# Patient Record
Sex: Female | Born: 1975 | Race: Black or African American | Hispanic: No | Marital: Married | Smoking: Never smoker
Health system: Southern US, Community
[De-identification: ages and names within clinical notes are randomized; demographics above are authoritative.]

## PROBLEM LIST (undated history)

## (undated) ENCOUNTER — Inpatient Hospital Stay (HOSPITAL_COMMUNITY): Payer: Self-pay

## (undated) DIAGNOSIS — D6859 Other primary thrombophilia: Secondary | ICD-10-CM

## (undated) DIAGNOSIS — Z8719 Personal history of other diseases of the digestive system: Secondary | ICD-10-CM

## (undated) DIAGNOSIS — O021 Missed abortion: Secondary | ICD-10-CM

## (undated) DIAGNOSIS — Z8742 Personal history of other diseases of the female genital tract: Secondary | ICD-10-CM

## (undated) DIAGNOSIS — I10 Essential (primary) hypertension: Secondary | ICD-10-CM

## (undated) DIAGNOSIS — Z8709 Personal history of other diseases of the respiratory system: Secondary | ICD-10-CM

## (undated) DIAGNOSIS — O039 Complete or unspecified spontaneous abortion without complication: Secondary | ICD-10-CM

## (undated) DIAGNOSIS — K5730 Diverticulosis of large intestine without perforation or abscess without bleeding: Secondary | ICD-10-CM

## (undated) HISTORY — DX: Personal history of other diseases of the respiratory system: Z87.09

## (undated) HISTORY — DX: Other primary thrombophilia: D68.59

## (undated) HISTORY — DX: Personal history of other diseases of the digestive system: Z87.19

## (undated) HISTORY — DX: Complete or unspecified spontaneous abortion without complication: O03.9

---

## 1997-08-22 HISTORY — PX: GYNECOLOGIC CRYOSURGERY: SHX857

## 1998-09-21 ENCOUNTER — Other Ambulatory Visit: Admission: RE | Admit: 1998-09-21 | Discharge: 1998-09-21 | Payer: Self-pay | Admitting: Obstetrics and Gynecology

## 2000-02-03 ENCOUNTER — Inpatient Hospital Stay (HOSPITAL_COMMUNITY): Admission: AD | Admit: 2000-02-03 | Discharge: 2000-02-03 | Payer: Self-pay | Admitting: Obstetrics and Gynecology

## 2000-03-21 ENCOUNTER — Ambulatory Visit (HOSPITAL_COMMUNITY): Admission: RE | Admit: 2000-03-21 | Discharge: 2000-03-21 | Payer: Self-pay | Admitting: *Deleted

## 2000-03-21 ENCOUNTER — Encounter: Payer: Self-pay | Admitting: *Deleted

## 2000-04-18 ENCOUNTER — Ambulatory Visit (HOSPITAL_COMMUNITY): Admission: RE | Admit: 2000-04-18 | Discharge: 2000-04-18 | Payer: Self-pay | Admitting: *Deleted

## 2000-06-06 ENCOUNTER — Encounter (INDEPENDENT_AMBULATORY_CARE_PROVIDER_SITE_OTHER): Payer: Self-pay

## 2000-06-06 ENCOUNTER — Inpatient Hospital Stay (HOSPITAL_COMMUNITY): Admission: RE | Admit: 2000-06-06 | Discharge: 2000-06-11 | Payer: Self-pay | Admitting: *Deleted

## 2000-06-07 ENCOUNTER — Encounter: Payer: Self-pay | Admitting: Obstetrics and Gynecology

## 2000-06-13 ENCOUNTER — Inpatient Hospital Stay (HOSPITAL_COMMUNITY): Admission: AD | Admit: 2000-06-13 | Discharge: 2000-06-13 | Payer: Self-pay | Admitting: *Deleted

## 2000-06-14 ENCOUNTER — Inpatient Hospital Stay (HOSPITAL_COMMUNITY): Admission: AD | Admit: 2000-06-14 | Discharge: 2000-06-14 | Payer: Self-pay | Admitting: *Deleted

## 2000-06-16 ENCOUNTER — Inpatient Hospital Stay (HOSPITAL_COMMUNITY): Admission: AD | Admit: 2000-06-16 | Discharge: 2000-06-16 | Payer: Self-pay | Admitting: *Deleted

## 2004-06-17 ENCOUNTER — Ambulatory Visit (HOSPITAL_COMMUNITY): Admission: RE | Admit: 2004-06-17 | Discharge: 2004-06-17 | Payer: Self-pay | Admitting: Gynecology

## 2004-08-18 ENCOUNTER — Ambulatory Visit (HOSPITAL_COMMUNITY): Admission: RE | Admit: 2004-08-18 | Discharge: 2004-08-18 | Payer: Self-pay | Admitting: Gynecology

## 2004-10-07 ENCOUNTER — Ambulatory Visit (HOSPITAL_COMMUNITY): Admission: RE | Admit: 2004-10-07 | Discharge: 2004-10-07 | Payer: Self-pay | Admitting: Gynecology

## 2004-10-28 ENCOUNTER — Encounter (INDEPENDENT_AMBULATORY_CARE_PROVIDER_SITE_OTHER): Payer: Self-pay | Admitting: *Deleted

## 2004-10-28 ENCOUNTER — Inpatient Hospital Stay (HOSPITAL_COMMUNITY): Admission: RE | Admit: 2004-10-28 | Discharge: 2004-10-31 | Payer: Self-pay | Admitting: Gynecology

## 2004-12-27 ENCOUNTER — Ambulatory Visit: Payer: Self-pay | Admitting: Oncology

## 2005-07-05 ENCOUNTER — Ambulatory Visit: Payer: Self-pay | Admitting: Oncology

## 2005-10-27 ENCOUNTER — Ambulatory Visit: Payer: Self-pay | Admitting: Obstetrics & Gynecology

## 2006-03-16 ENCOUNTER — Ambulatory Visit: Payer: Self-pay | Admitting: Obstetrics & Gynecology

## 2006-03-16 ENCOUNTER — Encounter: Payer: Self-pay | Admitting: Obstetrics & Gynecology

## 2007-03-01 ENCOUNTER — Encounter: Payer: Self-pay | Admitting: Obstetrics & Gynecology

## 2007-03-01 ENCOUNTER — Ambulatory Visit: Payer: Self-pay | Admitting: Obstetrics & Gynecology

## 2007-04-05 ENCOUNTER — Ambulatory Visit: Payer: Self-pay | Admitting: Obstetrics & Gynecology

## 2007-06-05 ENCOUNTER — Ambulatory Visit: Payer: Self-pay | Admitting: Gynecology

## 2007-10-30 ENCOUNTER — Ambulatory Visit: Payer: Self-pay | Admitting: Gynecology

## 2007-11-12 ENCOUNTER — Ambulatory Visit: Payer: Self-pay | Admitting: Gynecology

## 2008-03-26 ENCOUNTER — Ambulatory Visit: Payer: Self-pay | Admitting: Gynecology

## 2008-04-08 ENCOUNTER — Inpatient Hospital Stay (HOSPITAL_COMMUNITY): Admission: AD | Admit: 2008-04-08 | Discharge: 2008-04-09 | Payer: Self-pay | Admitting: Obstetrics & Gynecology

## 2008-08-25 ENCOUNTER — Ambulatory Visit: Payer: Self-pay | Admitting: Obstetrics & Gynecology

## 2008-08-25 ENCOUNTER — Encounter (INDEPENDENT_AMBULATORY_CARE_PROVIDER_SITE_OTHER): Payer: Self-pay | Admitting: Gynecology

## 2008-08-25 LAB — CONVERTED CEMR LAB
HCV Ab: NEGATIVE
Hepatitis B Surface Ag: NEGATIVE

## 2008-08-26 ENCOUNTER — Encounter (INDEPENDENT_AMBULATORY_CARE_PROVIDER_SITE_OTHER): Payer: Self-pay | Admitting: Gynecology

## 2008-08-26 LAB — CONVERTED CEMR LAB: WBC, Wet Prep HPF POC: NONE SEEN

## 2009-04-13 ENCOUNTER — Ambulatory Visit: Payer: Self-pay | Admitting: Obstetrics and Gynecology

## 2009-04-13 LAB — CONVERTED CEMR LAB
Chlamydia, DNA Probe: NEGATIVE
HCV Ab: NEGATIVE
Hepatitis B Surface Ag: NEGATIVE

## 2009-04-14 ENCOUNTER — Encounter: Payer: Self-pay | Admitting: Obstetrics and Gynecology

## 2009-04-14 LAB — CONVERTED CEMR LAB

## 2009-04-30 ENCOUNTER — Ambulatory Visit: Payer: Self-pay | Admitting: Obstetrics & Gynecology

## 2009-04-30 ENCOUNTER — Encounter: Payer: Self-pay | Admitting: Obstetrics & Gynecology

## 2009-10-05 ENCOUNTER — Ambulatory Visit: Payer: Self-pay | Admitting: Obstetrics and Gynecology

## 2009-10-06 ENCOUNTER — Encounter: Payer: Self-pay | Admitting: Obstetrics and Gynecology

## 2009-10-06 LAB — CONVERTED CEMR LAB
Chlamydia, DNA Probe: NEGATIVE
Clue Cells Wet Prep HPF POC: NONE SEEN
GC Probe Amp, Genital: NEGATIVE
Trich, Wet Prep: NONE SEEN

## 2009-11-19 ENCOUNTER — Ambulatory Visit: Payer: Self-pay | Admitting: Obstetrics & Gynecology

## 2009-12-31 ENCOUNTER — Ambulatory Visit: Payer: Self-pay | Admitting: Obstetrics and Gynecology

## 2010-01-01 ENCOUNTER — Encounter: Payer: Self-pay | Admitting: Obstetrics and Gynecology

## 2010-01-01 LAB — CONVERTED CEMR LAB: Clue Cells Wet Prep HPF POC: NONE SEEN

## 2010-03-16 ENCOUNTER — Ambulatory Visit: Payer: Self-pay | Admitting: Obstetrics & Gynecology

## 2010-03-17 ENCOUNTER — Encounter (INDEPENDENT_AMBULATORY_CARE_PROVIDER_SITE_OTHER): Payer: Self-pay | Admitting: *Deleted

## 2010-03-17 LAB — CONVERTED CEMR LAB
Clue Cells Wet Prep HPF POC: NONE SEEN
Trich, Wet Prep: NONE SEEN

## 2010-05-04 ENCOUNTER — Ambulatory Visit: Payer: Self-pay | Admitting: Family Medicine

## 2010-05-04 ENCOUNTER — Encounter: Payer: Self-pay | Admitting: Obstetrics & Gynecology

## 2010-05-04 LAB — CONVERTED CEMR LAB
ALT: 14 units/L (ref 0–35)
AST: 18 units/L (ref 0–37)
Albumin: 4.5 g/dL (ref 3.5–5.2)
Alkaline Phosphatase: 52 units/L (ref 39–117)
BUN: 11 mg/dL (ref 6–23)
Calcium: 9 mg/dL (ref 8.4–10.5)
Chloride: 102 meq/L (ref 96–112)
HCV Ab: NEGATIVE
Hepatitis B Surface Ag: NEGATIVE
LDL Cholesterol: 73 mg/dL (ref 0–99)
Potassium: 3.8 meq/L (ref 3.5–5.3)
Sodium: 138 meq/L (ref 135–145)
Total Protein: 7.8 g/dL (ref 6.0–8.3)

## 2010-12-14 ENCOUNTER — Ambulatory Visit (INDEPENDENT_AMBULATORY_CARE_PROVIDER_SITE_OTHER): Payer: 59 | Admitting: Obstetrics and Gynecology

## 2010-12-14 DIAGNOSIS — N898 Other specified noninflammatory disorders of vagina: Secondary | ICD-10-CM

## 2010-12-14 DIAGNOSIS — N899 Noninflammatory disorder of vagina, unspecified: Secondary | ICD-10-CM

## 2011-01-04 NOTE — Assessment & Plan Note (Signed)
[name], [name]                 ACCOUNT NO.:  [id number]   MEDICAL RECORD NO.:  [id number]          PATIENT TYPE:  POB   LOCATION:  [facility] at [facility]         FACILITY:  [facility]   PHYSICIAN:  Scheryl Darter, MD       DATE OF BIRTH:  [birth year]   DATE OF SERVICE:  03/16/2010                                  CLINIC NOTE   CHIEF COMPLAINT:  Vaginal discharge and mild right lower quadrant pain.   The patient is a 35 year old black female gravida 2, para 1-1-0-1.  She  states that she has had several days of slight vaginal burning and  discharge which she thinks may or may not be yeast.  She has had  bacterial vaginosis before.  She had been treated for vaginal yeast  infection several times.  She states that Diflucan use will relieve the  symptoms, but she does note that she has had recurrences.  No odor is  noted.  She has seen some slight brownish tinged to the discharge in the  last few days and she has also had few days a mild right lower quadrant  cramping.  She does this for the last few months right before the period  starts.  The patient has history of protein C deficiency.  Her current  medication is Zyrtec.   ALLERGIES:  No known drug allergies.   On physical exam, the patient is in no acute distress.  Affect is  normal.  Pelvic exam, external genitalia showed some minimal vulvar  irritation and some slight discharge consistent with mild yeast with  some gram staining consistent with light spotting.  Cervix is normal.  No cervical motion tenderness.  Uterus is normal size.  No adnexal  masses or tenderness.  Wet mount was obtained today.   IMPRESSION:  1. Likely recurrent vaginal yeast infection.  2. Premenstrual cramping.   PLAN:  I gave her prescription for Diflucan 150 mg 2 tablets.  She can  take first tablet; and if her symptoms are relieved, she can repeat the  dose after a week if she has recurrent symptoms.  I will prescribe  refills.  She is scheduled for her  yearly exam in September.  She will  notify us if her symptoms do not improve.      Scheryl Darter, MD     JA/MEDQ  D:  03/16/2010  T:  03/16/2010  Job:  [id number]

## 2011-01-04 NOTE — Assessment & Plan Note (Signed)
NAMEBOBIE, CARIS                 ACCOUNT NO.:  [id number]   MEDICAL RECORD NO.:  [id number]          PATIENT TYPE:  POB   LOCATION:  [facility] at [facility]         FACILITY:  [facility]   PHYSICIAN:  Ginger Carne, MD DATE OF BIRTH:  26-Jul-1976   DATE OF SERVICE:  03/26/2008                                  CLINIC NOTE   The patient is here today for routine gynecologic evaluation.  She had  questions about utilizing Mirena IUD, information provided including  literature to the patient.  Apart from that, she has no specific GI, GU,  or cardiac complaints.  Right now, the patient is using condoms for  birth control.   SALIENT PHYSICAL FINDINGS:  VITAL SIGNS:  Blood pressure is 128/83,  height 5 feet 5 inches, pulse 74 and regular.  HEENT:  Grossly normal.  BREASTS:  Exam without masses, discharge, thickenings, or tenderness.  CHEST:  Clear to percussion and auscultation.  CARDIOVASCULAR:  Exam without murmurs or enlargements, regular rate and  rhythm.  EXTREMITY, LYMPHATIC, SKIN, NEUROLOGICAL, AND MUSCULOSKELETAL SYSTEMS:  Within normal limits.  ABDOMEN:  Soft without gross hepatosplenomegaly.  PELVIC EXAM:  Pap smear, GC, and chlamydia ordered.  EXTERNAL GENITALIA: Vulva and vagina normal.  Cervix smooth without  erosions or lesions.  Uterus is small, anteverted, and flexed.  Both  adnexa palpable, found to be normal.   IMPRESSION:  Normal gynecologic exam.   PLAN:  The patient will schedule to have Mirena IUD placed.           ______________________________  Ginger Carne, MD     SHB/MEDQ  D:  03/26/2008  T:  03/27/2008  Job:  [id number]

## 2011-01-04 NOTE — Assessment & Plan Note (Signed)
NAMEKELSEI, DEFINO                 ACCOUNT NO.:  [id number]   MEDICAL RECORD NO.:  [id number]          PATIENT TYPE:  POB   LOCATION:  [facility] at [facility]         FACILITY:  [facility]   PHYSICIAN:  Elsie Lincoln, MD      DATE OF BIRTH:  Dec 25, 1975   DATE OF SERVICE:                                  CLINIC NOTE   The patient is a 35 year old female who was seen last year for her  yearly physical.  She has protein C deficiency.  She is not currently  sexually active.  Her only complaint is of some itching in her left  groin and possibly a yeast infection.  Her left groin looks like it has  some evidence of chronic scratching but is an intertriginous area so  yeast could be a factor.  We discussed keeping the area dry, powder,  hydrocortisone cream, and also 2 doses of Diflucan three days apart.   PAST MEDICAL HISTORY:  No change.   PAST SURGICAL HISTORY:  No change.   PAST GYNECOLOGIC HISTORY:  No change.  Her Pap smear in 2007 did have a  T zone this time.   FAMILY HISTORY:  Remains negative.   REVIEW OF SYMPTOMS:  All negative except as above.  No galactorrhea.   ALLERGIES:  None.   MEDICATIONS:  None.   PHYSICAL EXAMINATION:  VITAL SIGNS:  Pulse 78, blood pressure 130/83,  weight 157.  GENERAL:  Well nourished, well developed, no apparent distress.  HEENT:  Normocephalic atraumatic.  Thyroid with no masses.  Positive  hirsutism remains.  LUNGS:  Clear to auscultation bilaterally.  HEART:  Regular rate and rhythm.  BREASTS:  No masses.  Nontender.  No galactorrhea.  ABDOMEN:  Soft, nontender, nondistended.  No rebound or guarding.  No  organomegaly. No hernia.  PELVIC:  Genitalia Tanner V.  Vagina pink, normal rugae, small amount of  old blood.  The patient is ending her cycle.  Cervix is closed,  nontender.  Uterus anteverted, nontender.  Adnexa, no mass, nontender.  Urethra not tender.  Bladder no prolapse.  Perineum intact.  RECTAL:  No hemorrhoids.  EXTREMITIES:   Nontender.  No edema.   ASSESSMENT:  A 36 year old female for yearly exam.   PLAN:  1. Pap smear.  2. Diflucan.  3. Powder to inguinal area.  4. Keep inguinal area dry.  5. Return to clinic in a year.           ______________________________  Elsie Lincoln, MD     KL/MEDQ  D:  03/01/2007  T:  03/01/2007  Job:  [id number]

## 2011-01-04 NOTE — Assessment & Plan Note (Signed)
[name], [name]                 ACCOUNT NO.:  [id number]   MEDICAL RECORD NO.:  [id number]          PATIENT TYPE:  POB   LOCATION:  [facility] at [facility]         FACILITY:  [facility]   PHYSICIAN:  Catalina Antigua, MD     DATE OF BIRTH:  October 06, 1975   DATE OF SERVICE:  12/31/2009                                  CLINIC NOTE   This is a 35 year old, G2, P1-1-0-1, who presents for evaluation of  recurrent vaginal infection.  The patient states that over the past few  months preceding her periods.  She noted increase in vaginal pruritus  and this occurred like white discharge which she treats with Diflucan  with good results.  The patient presents today secondary to recurrence  of vaginal pruritus, following her menses did not notice any abnormal  discharge, but did take Diflucan on Tuesday.  The patient is otherwise  without any other complaints.   On physical exam, she had normal external genitalia.  No evidence of  yeast infection on the vulva or intercrural folds.  On speculum exam,  she had normal-appearing vaginal mucosa and cervix.  No abnormal  bleeding.  She had a whitish-mucousy discharge.   ASSESSMENT AND PLAN:  This is a 35 year old G2, P1-1-0-1, who is here  for evaluation of vaginal pruritus.  A wet prep was performed.  The  patient already has a prescription for Diflucan that can still be  filled.  The patient was advised to apply Desitin cream twice a day to  see if that would help with her symptoms of vaginal pruritus and  actually prevent them in the future as well.  The patient was advised to  return to clinic if her symptoms did not resolve despite the Diflucan  and perhaps a prescription for boric acid can be provided as this has  been ongoing for several months now.  The patient is otherwise to return  in September for her annual exam.           ______________________________  Catalina Antigua, MD     PC/MEDQ  D:  12/31/2009  T:  01/01/2010  Job:  [id number]

## 2011-01-04 NOTE — Assessment & Plan Note (Signed)
[name], [name]                 ACCOUNT NO.:  [id number]   MEDICAL RECORD NO.:  [id number]          PATIENT TYPE:  POB   LOCATION:  [facility]         FACILITY:  [facility]   PHYSICIAN:  Jaynie Collins, MD     DATE OF BIRTH:  [birth year]   DATE OF SERVICE:  04/30/2009                                  CLINIC NOTE   CHIEF COMPLAINT:  The patient is here for annual examination.   HISTORY OF PRESENT ILLNESS:  The patient is a 35 year old gravida 2,  para 1-1-0-1 who is here for her annual physical examination.  The  patient does report some vulva irritation for a few days and did come in  on April 13, 2009, because she was worried about possible sexually  transmitted infection.  She did have a comprehensive sexually  transmitted infection screen including gonorrhea, Chlamydia, hepatitis  B, C, HIV, RPR and herpes simplex II, which all came back negative.  She  also had a negative wet prep at that evaluation.  The patient was  reassured by those results.  She does say that her vaginal irritation  has been getting better and she is reassured that nothing was found on  evaluation.  She denies any irregular or intermenstrual bleeding,  abnormal vaginal discharge, or any other gynecologic concerns.   PAST OBSTETRICS AND GYNECOLOGIC HISTORY:  The patient does have a  history of a classical cesarean section at 6 months with a neonatal  demise and a repeat cesarean section at term 4 years ago.  After her  neonatal demise, she has extensive workup which revealed a protein C  deficiency and she was on anticoagulation for her second pregnancy.  The  patient has regular menstrual periods.  Her periods last for 5 days with  minimal or no pain.  She denies any intermenstrual bleeding.  She is  sexually active and uses condoms for contraception and STI prevention.  The patient had a history of an abnormal Pap smear at age 35, for which  she underwent cryotherapy and has had normal followup Pap  smears.  Of  note, her last menstrual period was April 05, 2009.  She denies any  sexually transmitted infections.   PAST MEDICAL HISTORY:  Protein C deficiency.   PAST SURGICAL HISTORY:  Cesarean section x2.   MEDICATIONS:  None.   ALLERGIES:  No known drug allergies.  The patient does endorse seasonal  allergies.   SOCIAL HISTORY:  The patient works in the [employer].  She  lives with her daughter at home.  She denies any current physical,  sexual or emotional abuse.  She does not smoke.  She drinks 1 glass of  wine per week.  She denies any illicit drugs.   FAMILY HISTORY:  She has a maternal aunt with breast cancer and a  maternal aunt and maternal great aunt with colon cancer.  Of note, her  maternal aunt with breast cancer was diagnosed in the 26s.  She denies  any other gynecologic cancers.  There is also an extensive family  history of diabetes, hypertension, and heart disease.   REVIEW OF SYSTEMS:  The patient  reports resolving vulvar irritation.  She exercises occasionally.  She takes daily multivitamins and she does  have a primary care physician, Dr. Micah Noel or Dr. Marguerite Olea in [place].   PHYSICAL EXAMINATION:  VITAL SIGNS:  Blood pressure 125/79, pulse 71,  weight 159 pounds, height 5 feet 5 inches.  GENERAL:  No apparent distress.  HEENT:  Normocephalic, atraumatic.  She does have positive hirsutism on  her chin.  NECK:  Supple.  No masses.  LUNGS:  Clear to auscultation bilaterally.  HEART:  Regular rate and rhythm.  BREASTS:  Symmetric in size, soft.  No abnormal masses, skin changes,  lymphadenopathy or nipple discharge noted.  HEART:  Regular rate and rhythm.  ABDOMEN:  Soft, nontender, nondistended.  No organomegaly.  EXTREMITIES:  Nontender.  PELVIC:  Normal external female genitalia.  Pink, well-rugated vagina.  Normal discharge noted.  No lesions noted.  Cervix is closed and is  nontender.  Uterus is small, anteverted, nontender and adnexa  are normal  on palpation and nontender.  RECTAL:  Within normal limits externally.   ASSESSMENT AND PLAN:  The patient is a 35 year old gravida 2, para 1-1-0-  1 here for a physical examination.  The patient had a normal breast  examination.  We will follow up Pap smear results.  The patient has no  other concerns at this point.  She was told to call or come back to the  clinic for any gynecologic issues or in a year for her next annual  examination.           ______________________________  Jaynie Collins, MD     UA/MEDQ  D:  04/30/2009  T:  05/01/2009  Job:  [id number]

## 2011-01-04 NOTE — Assessment & Plan Note (Signed)
NAMECUBA, NATARAJAN                 ACCOUNT NO.:  [id number]   MEDICAL RECORD NO.:  [id number]          PATIENT TYPE:  POB   LOCATION:  [facility]         [facility]ACILITY:  [facility]   PHYSICIAN:  Allie Bossier, MD        DATE OF BIRTH:  1976/06/23   DATE OF SERVICE:  05/04/2010                                  CLINIC NOTE   Ms. [name] is a 35 year old divorced white gravida 2, para 2.  She has 1  living child is a 4-year-old daughter.  She comes in here for annual  exam.  She has no complaints.  She would like her annual FTI testing and  fasting lipids and sugar today.   PAST MEDICAL HISTORY:  Protein C deficiency.   REVIEW OF SYSTEMS:  She has been monogamous for the last year.  She  works at [employer].  She denies dyspareunia.  She uses condoms with  intercourse.   PAST SURGICAL HISTORY:  C-section x2.   FAMILY HISTORY:  Significant for breast cancer in the maternal aunt,  colon cancer within a different maternal aunt and maternal great-aunt.  She denies family history of GYN cancers or diabetes.   SOCIAL HISTORY:  She drinks wine on occasion.  She denies tobacco or  illegal drug use.   No known drug allergies.  No latex allergies.   MEDICATIONS:  Zyrtec on a p.r.n. basis.   PHYSICAL EXAMINATION:  GENERAL:  A well-nourished, well-hydrated very  pleasant white female.  VITAL SIGNS:  Height 5 feet 5 inches, weight 158, blood pressure 127/90,  pulse 71.  HEENT:  Normal.  BREASTS:  Normal bilaterally.  HEART:  Regular rate and rhythm.  LUNGS:  Clear to auscultation bilaterally.  ABDOMEN:  Benign.  EXTERNAL GENITALIA:  No gross lesions.  Cervix nulliparous with normal  discharge.  Uterus normal size and shape, anteverted, not particularly  mobile.  Adnexa nontender.  No masses.   ASSESSMENT AND PLAN:  1. Annual exam.  I have checked her Pap smear.  Recommend self-breast      and self-vulvar exams monthly.  2. I have ordered the FTI screening as she requested.  3. Birth  control.  We discussed Essure tubal ligation as well as      laparoscopic tubal ligation and risks and benefits of each along      with the option of Mirena.  4. Family history of breast and colon cancer.  I have given her      information on BRCA1 and BRCA2 analysis.  5. I have ordered fasting lipids and sugar today.      Allie Bossier, MD     MCD/MEDQ  D:  05/04/2010  T:  05/05/2010  Job:  [id number]

## 2011-01-07 NOTE — Discharge Summary (Signed)
[facility]  Patient:    [name], [name]                        MRN: [id number] Adm. Date:  [id number] Disc. Date: [id number] Attending:  Michaelle Copas Dictator:   Jamey Reas, M.D.                           Discharge Summary  DATE OF BIRTH:  [birth year]  ADMISSION DIAGNOSES:          1. [age]-year-old gravida 1, para 0, at                                  25 weeks by a 14-week ultrasound.                               2. Fetal distress with reverse diastolic flow                                  and severe intrauterine growth retardation on                                  ultrasound.  DISCHARGE DIAGNOSES:          1. [age]-year-old gravida 1, para 0, at                                  25 weeks by a 14-week ultrasound.                               2. Fetal distress with reverse diastolic flow                                  and severe intrauterine growth retardation on                                  ultrasound.                               3. Status post classical cesarean section with                                  neonatal demise at 25-3/7 weeks.  CONSULTATIONS:                None.  PROCEDURES:                   Classical cesarean section on June 08, 2000.  HOSPITAL COURSE:              [age]-year-old gravida 1, para 0-1-0-0 admitted at 25 weeks by 14-week ultrasound at 28 weeks by LMP for severe IUGR of less than third percentile as well as reverse diastolic cord flow. Ultrasound repeated on hospital  day #1, and diastolic flow had improved with normal MCA flow.  The patient continued on close observation.  On the night of hospital day #1, the patients fetus had repetitive decelerations to 60s and to 100s for 60 seconds to two minutes.  Discussion with team members and patient led to conclusion that the babys survival was unsure and, given ultrasound reports and fetal decelerations without  accelerations or reactivity, it was thought best to take the patient to cesarean section.  Due to the patients early gestational age and small uterus, a classical C-section was performed.  A viable female infant delivered with Apgars of 4 at one minute, 5 at five minutes, and 6 at 10 minutes, with a weight of 423 grams. The NICU team intubated the fetus; however, the infant lived less than 24 hours.  The patient remained in stable condition postoperatively.  She had routine care.  She will be discharged to home in good condition.  DISCHARGE MEDICATIONS:        1. Percocet 5/500 mg 1-2 p.o. q.4-6h. p.r.n.                                  severe pain.                               2. Motrin 600 mg 1 p.o. t.i.d. p.r.n. pain.                               3. Prenatal vitamins 1 p.o. q.d. x 6 weeks.  DISCHARGE INSTRUCTIONS:       Per routine for C-section.  FOLLOW-UP:                    The patient will follow up with [facility] in six weeks or return to maternity admissions if she starts feeling depressed or has ______  or any complications from her surgery. DD:  06/11/00 TD:  06/12/00 Job: [id number]

## 2011-01-07 NOTE — Group Therapy Note (Signed)
NAMECEANA, FIALA                 ACCOUNT NO.:  [id number]   MEDICAL RECORD NO.:  [id number]          PATIENT TYPE:  POB   LOCATION[facility]  [facility]                   FACILITY:  [facility]   PHYSICIAN:  Elsie Lincoln, MD      DATE OF BIRTH:  August 03, 1976   DATE OF SERVICE:  03/16/2006                                    CLINIC NOTE   The patient is a 35 year old female; LMP March 02, 2006; para 1-1-0-1.  She  has a history of a 24-week loss secondary to protein C deficiency.  The  patient presents for physical.  Her only recent complaint is vomiting last  night and spotting.  The patient used to have a problem with intermenstrual  spotting, was on birth control pills, but obviously she cannot be on those  anymore secondary to her protein C deficiency.  This is the first month in  many, many, many months that she has ever had intermenstrual spotting.  Will  not do anything about this.  If it continues then we will address.  The  vomiting she thinks is secondary to food poisoning.  If she has any problems  with this she will follow up with her primary care physician.  She is not  sexually active currently.  Her last intercourse was November 08, 2005, so  there is no way she is pregnant.  She did have an STD screen which was  negative for everything earlier in March; however, she would like a complete  STD screen today.  She did use condoms in March.   PAST MEDICAL HISTORY:  Protein C deficiency.   PAST SURGICAL HISTORY:  C-section x1.   PAST GYNECOLOGICAL HISTORY:  C-section x1 and a 24-week loss secondary to  placental abruption per patient report.  Not sexually active currently.  She  has had an abnormal Pap smear at age 48 and she underwent cryo.  Her last  Pap smear did not have a T zone which is probably secondary to the cryo.   FAMILY HISTORY:  Negative for diabetes, ovarian cancer, breast cancer.  She  has two distant aunts with colon cancer.  She has a strong family history of  hypertension.   REVIEW OF SYMPTOMS:  The patient now reports a history of galactorrhea.  She  stopped breast feeding approximately a year ago.   ALLERGIES:  None.   MEDICATIONS:  None.   PHYSICAL EXAMINATION:  GENERAL:  Well-nourished, well-developed, no apparent  distress.  VITAL SIGNS:  Pulse 88, blood pressure 98/70, weight 155.  HEENT:  Normocephalic, atraumatic.  Thyroid:  No masses.  Positive hirsutism  on the face and breasts and midline below the umbilicus.  LUNGS:  Clear to auscultation bilaterally.  HEART:  Regular rate and rhythm.  ABDOMEN:  Soft, nontender, nondistended.  No rebound, no guarding, no  organomegaly, no hernia.  PELVIC:  Genitalia:  Tanner V.  Vagina:  Pink, normal rugae.  Miniscule  amount of blood.  Cervix:  Closed, nontender.  Uterus:  Anteverted,  nontender.  Adnexa:  No masses, nontender.  Urethra:  Nontender.  Bladder:  No prolapse.  Perineum:  Intact.  Rectum:  No hemorrhoids.  EXTREMITIES:  Nontender.  NEUROLOGIC:  Intact.   ASSESSMENT AND PLAN:  A 35 year old female.   1.  Pap smear.  2.  Sexually-transmitted diseases screening.  3.  Prolactin to rule out hyperprolactinemia causing galactorrhea.  4.  Return to clinic in a year or p.r.n.           ______________________________  Elsie Lincoln, MD     KL/MEDQ  D:  03/16/2006  T:  03/16/2006  Job:  [id number]

## 2011-01-07 NOTE — Op Note (Signed)
[facility]  Patient:    [name], [name]                        MRN: [id number] Proc. Date: 06/11/00 Adm. Date:  [id number] Disc. Date: [id number] Attending:  Michaelle Copas                           Operative Report  PREOPERATIVE DIAGNOSES:         1. Intrauterine pregnancy at 25 plus weeks.                                 2. Severe intrauterine growth restriction.                                 3. Abnormal umbilical artery Doppler.                                 4. Suspicious fetal heart rate tracing.  POSTOPERATIVE DIAGNOSES:        1. Intrauterine pregnancy at 25 plus weeks.                                 2. Severe intrauterine growth restriction.                                 3. Abnormal umbilical artery Doppler.                                 4. Suspicious fetal heart rate tracing.  OPERATION:                      Primary classical cesarean section via Pfannenstiel.  SURGEON:                        Roseanna Rainbow, M.D.  ASSISTANTS:                     1. Charles A. Clearance Coots, M.D.                                 2. Jamey Reas, M.D.  ANESTHESIA:                     Spinal.  COMPLICATIONS:                  None.  ESTIMATED BLOOD LOSS:           600 cc.  FLUIDS:                         1500 cc lactated Ringers.  URINE OUTPUT:                   300 cc clear urine at the end of the procedure.  INDICATIONS:  The patient is a 35 year old G1, P0 at 25+ weeks with an ultrasound demonstrating severe intrauterine growth restriction with an estimated fetal weight percentile of less than third percentile. Umbilical artery Dopplers were initially performed and demonstrated reversal of flow with follow-up studies demonstrating minimal diastolic flow with an elevated SD ratio.  The patient had received a course of steroids and was placed on bed rest.  The fetal heart tracing had averaged two decreased  long term variabilities with occasional moderate variable decelerations with over shoot.  FINDINGS:                       Female infant in cephalic presentation. Pediatrics were present at delivery.  Normal uterus, tubes and ovaries.  DESCRIPTION OF PROCEDURE:       The patient was taken to the operating room where spinal anesthetic was administered and found to be adequate.  She was then prepped and draped in the usual sterile fashion in the dorsal supine position with a leftward tilt.  A Pfannenstiel skin incision was then made with the scalpel and carried through to the underlying layer of fascia with the Bovie.  The fascia was incised in the midline and the incision extended laterally with the Mayo scissors.  The superior aspect of the fascial incision was then grasped with Kocher clamps, elevated, and the underlying rectus muscles dissected off.  Attention was then turned to the inferior aspect of this incision which was manipulated in a similar fashion.  The rectus muscles were then separated in the midline.  The parietal peritoneum was identified, tented up and entered sharply.  The peritoneal incision was then extended superiorly and inferiorly with good visualization of the bladder.  The bladder blade was then inserted and the vesicouterine peritoneum identified, grasped with pick ups and entered sharply with Metzenbaum scissors.  The incision was extended laterally and the bladder flap created digitally.  Upon inspection of the lower uterine segment it was felt that a low transverse elliptical incision could not be performed.  Decision at this point was made to proceed with a classical incision.  The bladder blade was inserted.  The uterus was then incised starting at the fundus in the midline and this incision was carried vertically down to the lower uterine segment.  The uterine incision was extended bluntly.  The bladder blade was then removed, and the infant was delivered.   The cord was clamped and cut, and the infant was handed off to the awaiting neonatologist.  Umbilical artery cord gas was sent.  The placenta was then removed.  The uterus was exteriorized and cleared of all clots and debris.  The uterine incision was repaired in layer with 0 Monocryl in a running locked fashion.  The second layer of the same suture was used to obtain excellent hemostasis.  The uterus was returned to the abdomen.  The gutters were cleared of all clots and the peritoneum was closed with 2-0 Vicryl. The fascia was reapproximated with 0 Vicryl in a running fashion.  The skin was closed with staples.  The patient tolerated the procedure well. Sponge, lap and needle counts were correct x 2.  One gram of cefazolin was given at cord clamp.  The patient was taken to the PACU in stable condition. DD:  06/11/00 TD:  06/12/00 Job: [id number]

## 2011-01-07 NOTE — Discharge Summary (Signed)
[name], [name]                 ACCOUNT NO.:  [id number]   MEDICAL RECORD NO.:  [id number]          PATIENT TYPE:  INP   LOCATION:  9133                          FACILITY:  [facility]   PHYSICIAN:  Ginger Carne, MD  DATE OF BIRTH:  1975/11/09   DATE OF ADMISSION:  10/28/2004  DATE OF DISCHARGE:  10/31/2004                                 DISCHARGE SUMMARY   FINAL DIAGNOSIS:  Term viable delivery of female infant. 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   PROCEDURES:  Repeat low transverse cesarean section.   HOSPITAL COURSE:  This patient is a 36 year old African-American female with  a previous classical cesarean section and scheduled for an elective repeat  cesarean section, presenting on October 28, 2004 in active labor. At the time  she was 3 cm, 100% effaced, -2 station with good fetal heart rate and intact  membranes. The patient was taken to the operating room and underwent a low  transverse cesarean section.   The patient's antenatal course was complicated by protein C thrombophilia  and was on Lovenox until the day of her cesarean section. The patient was  group B strep positive. She is B+ blood type and rubella immune. All STD  cultures were negative at 36 weeks.   The patient was afebrile, voided well. Incision was dry and had no other  complications in her postoperative period. The patient's postoperative  hemoglobin was 11.2 from preop of 13.6. She had one temperature elevation in  the first 24 hours of 100.4 degrees Fahrenheit and then down to 100.1  degrees Fahrenheit 4-6 hours later, but ultimately has had a normal  temperature profile.   Her incision was dry, minimal flow. The patient is breast feeding and baby  is doing well. The patient was discharged with ibuprofen and Percocet 5/325.  Routine postoperative and postpartum instructions provided to the patient.  Instructions also included to call the office with temperature elevation  above 100.4  degrees Fahrenheit, symptoms of mastitis, including increasing  breast pain, erythema or tenderness, increased vaginal bleeding, pelvic  pain, incisional drainage or any other concerns. The patient will return in  4 to 5 days to have her subcuticular suture removed and then return in 6  weeks for her routine postoperative postpartum visit. All questions  answered.      SHB/MEDQ  D:  10/31/2004  T:  11/01/2004  Job:  [id number]

## 2011-01-07 NOTE — Op Note (Signed)
[name], Horn                 ACCOUNT NO.:  [id number]   MEDICAL RECORD NO.:  [id number]          PATIENT TYPE:  INP   LOCATION:  9133                          FACILITY:  [facility]   PHYSICIAN:  Ginger Carne, MD  DATE OF BIRTH:  29-May-1976   DATE OF PROCEDURE:  DATE OF DISCHARGE:                                 OPERATIVE REPORT   PREOPERATIVE DIAGNOSES:  1.  Previous cesarean section.  2.  In active labor.  3.  History of protein C thrombophilia.   POSTOPERATIVE DIAGNOSES:  1.  Previous cesarean section.  2.  In active labor.  3.  History of protein C thrombophilia.  4.  Term viable delivery of female infant.   PROCEDURE:  Repeat low transverse cesarean section.   ASSISTANT:  None.   COMPLICATIONS:  None immediate.   ESTIMATED BLOOD LOSS:  500 cc.   ANESTHESIA:  Spinal.   SPECIMENS:  Cord bloods and placenta.   OPERATIVE FINDINGS:  Term female infant delivered in the vertex presentation  on October 28, 2004.  Apgars 9 and 9, weight 6 pounds 12 ounces.  No gross  abnormalities.  The baby cried spontaneously at delivery.  Amniotic fluid  was clear, not foul smelling.  Uterus, tubes, and ovaries showed normal  decidual changes of pregnancy.   OPERATIVE PROCEDURE:  The patient was prepped and draped in the usual  fashion and placed in the left lateral supine position.  Betadine solution  was used for antiseptic and the patient was catheterized prior to the  procedure.  After adequate spinal analgesia, a Pfannenstiel incision was  made and the abdomen opened.  The low uterine segment was incised  transversely after delivering the bladder flap.  The baby delivered, cord  clamped and cut, and infant given to the pediatric staff after bulb  suctioning.  Placenta removed manually.  Uterus inspected and closed the  uterine musculature in one layer of 0 Vicryl running interlocking suture.  Bleeding points hemostatically checked.  Blood clots removed from the  abdomen.  Closure  of the parietal peritoneum with 2-0 Vicryl running  suture, 0 Vicryl running for the fascia, 3-0 Vicryl for the subcutaneous  layer, and 4-0 Prolene for the subcuticular closure.  Instrument and sponge  counts were correct.  The patient tolerated the procedure well and returned  to the postanesthesia recovery room in excellent condition.      SHB/MEDQ  D:  10/28/2004  T:  10/28/2004  Job:  [id number]

## 2011-04-06 ENCOUNTER — Telehealth: Payer: Self-pay

## 2011-04-06 NOTE — Telephone Encounter (Signed)
PATIENT NEEDS DIFLUCAN CALLED IN FOR YEAST INFECTION. SHE USUALLY GETS TO PILLS TO [facility], [place]. THANKS. PATIENT HAS AN APPOINTMENT IN September TO COME IN FOR PHYSICAL.

## 2011-04-07 ENCOUNTER — Other Ambulatory Visit: Payer: Self-pay | Admitting: Gynecology

## 2011-04-07 DIAGNOSIS — B3731 Acute candidiasis of vulva and vagina: Secondary | ICD-10-CM

## 2011-04-07 DIAGNOSIS — B373 Candidiasis of vulva and vagina: Secondary | ICD-10-CM

## 2011-04-07 MED ORDER — FLUCONAZOLE 150 MG PO TABS: 150.0000 mg | ORAL_TABLET | Freq: Once | ORAL | Status: AC

## 2011-05-10 ENCOUNTER — Encounter: Payer: Self-pay | Admitting: Obstetrics & Gynecology

## 2011-05-10 ENCOUNTER — Ambulatory Visit (INDEPENDENT_AMBULATORY_CARE_PROVIDER_SITE_OTHER): Payer: 59 | Admitting: Obstetrics & Gynecology

## 2011-05-10 VITALS — BP 126/81 | HR 83 | Ht 65.0 in | Wt 158.0 lb

## 2011-05-10 DIAGNOSIS — Z113 Encounter for screening for infections with a predominantly sexual mode of transmission: Secondary | ICD-10-CM

## 2011-05-10 DIAGNOSIS — Z0000 Encounter for general adult medical examination without abnormal findings: Secondary | ICD-10-CM

## 2011-05-10 DIAGNOSIS — Z01419 Encounter for gynecological examination (general) (routine) without abnormal findings: Secondary | ICD-10-CM

## 2011-05-10 DIAGNOSIS — Z1272 Encounter for screening for malignant neoplasm of vagina: Secondary | ICD-10-CM

## 2011-05-11 NOTE — Assessment & Plan Note (Signed)
NAMENOELY, KUHNLE                 ACCOUNT NO.:  [id number]  MEDICAL RECORD NO.:  [id number]           PATIENT TYPE:  LOCATION:  [facility]           FACILITY:  PHYSICIAN:  Jaynie Collins, MD          DATE OF BIRTH:  DATE OF SERVICE:  05/10/2011                                 CLINIC NOTE  REASON FOR VISIT:  Annual examination.  Ms. [name] is a 35 year old, gravida 2, para 1-1-0-1 who comes in today for her annual examination.  The patient has no complaints today.  She is asking to be tested for her lipid panel and is also requesting a comprehensive STI screen.  She has no other gynecologic concerns.  PAST OBSTETRICAL HISTORY:  The patient is a gravida 2, para 1-1-0-1. She has a history of a classical cesarean section at 6 months with subsequent neonatal demise, and a repeat cesarean section that was done in 2006. After her neonatal demise, she had extensive workup which revealed a protein C deficiency.  PAST GYNECOLOGIC HISTORY:  Normal Pap smear as per the patient.  No sexually transmitted infections.  The patient does report having recurrent yeast infections.  PAST MEDICAL HISTORY:  Protein C deficiency.  PAST SURGICAL HISTORY:  Cesarean section x2.  MEDICATIONS:  Zyrtec as needed.  ALLERGIES:  No known drug allergies.  FAMILY HISTORY:  The patient has a history of breast cancer in her maternal aunt and colon cancer in her maternal aunt and maternal grandaunt.  SOCIAL HISTORY:  The patient drinks wine occasionally.  She denies any tobacco or illicit drug use.  The patient works at [employer].  REVIEW OF SYSTEMS:  A comprehensive review of systems was reviewed with the patient and was negative.  PHYSICAL EXAMINATION:  VITAL SIGNS:  Blood pressure 126/81, pulse 83, weight 158 pounds, and height 65 inches. GENERAL:  No apparent distress. HEENT:  Normocephalic and atraumatic. NECK:  Supple.  No masses. LUNGS:  Clear to auscultation bilaterally. HEART:  Regular  rate and rhythm. BREASTS:  Symmetric in size, soft, nontender.  No abnormal masses, skin changes, lymphadenopathy, or nipple discharge noted. ABDOMEN:  Soft, nontender, and nondistended.  No organomegaly. EXTREMITIES:  Nontender.  No cyanosis, clubbing, or edema. PELVIC:  Normal external female genitalia.  Pink, well-rugated vagina. Thick white discharge noted.  A sample was taken for wet prep.  No odor was noted.  No lesions noted.  Cervix is closed and is nontender.  Pap smear was obtained. Uterus is small, anteverted, nontender and adnexa are normal on palpation and there are no tenderness to bimanual examination.  ASSESSMENT/PLAN:  The patient is a 35 year old gravida 2, para 1-1-0-1 here for physical examination.  She had a normal breast examination.  We will follow up on the Pap smear results and her other labs that were checked today and manage accordingly.  The patient was told to call or come back in to the clinic for any gynecologic issues or in a year for her annual examination.    ______________________________ Jaynie Collins, MD    UA/MEDQ  D:  05/10/2011  T:  05/10/2011  Job:  [id number]

## 2011-05-11 NOTE — Progress Notes (Signed)
Please refer to dictated office visit note on 05/10/11.

## 2011-05-12 LAB — WET PREP, GENITAL: Yeast Wet Prep HPF POC: NONE SEEN

## 2011-05-12 LAB — CBC
MCH: 30.3 pg (ref 26.0–34.0)
MCHC: 30.7 g/dL (ref 30.0–36.0)
MCV: 98.6 fL (ref 78.0–100.0)
Platelets: 251 10*3/uL (ref 150–400)
RBC: 4.42 MIL/uL (ref 3.87–5.11)
RDW: 13.1 % (ref 11.5–15.5)

## 2011-05-12 LAB — LIPID PANEL
HDL: 61 mg/dL (ref >39)
LDL Cholesterol: 74 mg/dL (ref 0–99)
Total CHOL/HDL Ratio: 2.3 Ratio
Triglycerides: 32 mg/dL (ref <150)
VLDL: 6 mg/dL (ref 0–40)

## 2011-05-16 NOTE — Progress Notes (Signed)
Quick Note:  Needs colposcopy appointment for her LGSIL pap. Please call patient and schedule appointment.  Quynn Vilchis A 05/16/2011 1:17 PM   ______

## 2011-05-18 ENCOUNTER — Telehealth: Payer: Self-pay | Admitting: *Deleted

## 2011-05-18 NOTE — Telephone Encounter (Signed)
Message copied by Barbara Cower on Wed May 18, 2011  3:19 PM ------      Message from: Jaynie Collins A      Created: Mon May 16, 2011  1:18 PM       Needs colposcopy appointment for her LGSIL pap.  Please call patient and schedule appointment.            ANYANWU,UGONNA A      05/16/2011 1:17 PM

## 2011-05-18 NOTE — Telephone Encounter (Signed)
Message was left for patient to call the office for results.

## 2011-05-25 NOTE — Progress Notes (Signed)
Patient is schedule for colpo on 05/31/11.

## 2011-05-31 ENCOUNTER — Ambulatory Visit (INDEPENDENT_AMBULATORY_CARE_PROVIDER_SITE_OTHER): Payer: 59 | Admitting: Obstetrics & Gynecology

## 2011-05-31 VITALS — BP 117/82 | HR 88 | Wt 160.0 lb

## 2011-05-31 DIAGNOSIS — IMO0002 Reserved for concepts with insufficient information to code with codable children: Secondary | ICD-10-CM

## 2011-05-31 DIAGNOSIS — Z87410 Personal history of cervical dysplasia: Secondary | ICD-10-CM | POA: Insufficient documentation

## 2011-05-31 DIAGNOSIS — R87612 Low grade squamous intraepithelial lesion on cytologic smear of cervix (LGSIL): Secondary | ICD-10-CM

## 2011-05-31 LAB — POCT URINE PREGNANCY

## 2011-05-31 NOTE — Progress Notes (Signed)
  Colposcopy Procedure Note  Indications: Pap smear on 05/10/11 showed: low-grade squamous intraepithelial neoplasia (LGSIL - encompassing HPV,mild dysplasia,CIN I). The prior pap showed no abnormalities.  Prior cervical/vaginal disease: normal exam without visible pathology. Prior cervical treatment: cryosurgery and this occured when she was 35 years old.  Patient understands the role of HPV in leading to cervical dysplasia or neoplasia.  .  Procedure Details  The risks and benefits of the procedure and Written informed consent obtained.  Speculum placed in vagina and excellent visualization of cervix achieved, cervix swabbed x 3 with acetic acid solution.  Findings: Cervix: acetowhite lesion(s) noted at 9 and 3 o'clock; SCJ visualized 360 degrees without lesions, endocervical curettage performed, cervical biopsies taken at 9 and 3 o'clock, specimen labelled and sent to pathology and hemostasis achieved with Monsel's solution. Vaginal inspection: vaginal colposcopy not performed. Vulvar colposcopy: vulvar colposcopy not performed.  Specimens: Cervical biopsies and ECC  Complications: none.  Plan: Specimens labelled and sent to Pathology. Will base further treatment on Pathology findings. Treatment options discussed with patient.

## 2011-06-02 NOTE — Progress Notes (Signed)
Quick Note:  Benign colposcopy biopsies and ECC. Needs repeat pap smear in six months.   [name] A 06/02/2011 10:28 AM   ______

## 2011-06-10 ENCOUNTER — Telehealth: Payer: Self-pay | Admitting: *Deleted

## 2011-06-10 NOTE — Telephone Encounter (Signed)
Left message for patient that everything looked ok and she does need to follow up in 6 months.  She is to call us back for details or questions and to make that appointment.

## 2011-06-10 NOTE — Telephone Encounter (Signed)
Message copied by Barbara Cower on Fri Jun 10, 2011  8:07 AM ------      Message from: Jaynie Collins A      Created: Thu Jun 02, 2011 10:28 AM       Benign colposcopy biopsies and ECC. Needs repeat pap smear in six months.             ANYANWU,UGONNA A      06/02/2011 10:28 AM

## 2011-07-12 ENCOUNTER — Telehealth: Payer: Self-pay | Admitting: *Deleted

## 2011-07-12 MED ORDER — FLUCONAZOLE 150 MG PO TABS: 150.0000 mg | ORAL_TABLET | Freq: Once | ORAL | Status: AC

## 2011-07-12 NOTE — Telephone Encounter (Signed)
Patient needs prescription for Diflucan due to yeast infection.

## 2011-07-13 ENCOUNTER — Telehealth: Payer: Self-pay

## 2011-07-13 NOTE — Telephone Encounter (Signed)
PATIENT HAS A YEAST INFECTION, NEEDS AN ANTIBIOTIC. PATIENT LEFT A MESSAGE AND DID NOT SPECIFY PHARMACY ASSUMING ITS THE SAME PHARMACY WE HAVE USED IN THE PAST. THANKS!

## 2011-07-13 NOTE — Telephone Encounter (Signed)
rx was already called into pharmacy

## 2011-08-23 HISTORY — PX: COLPOSCOPY: SHX161

## 2011-12-26 ENCOUNTER — Emergency Department (HOSPITAL_COMMUNITY): Payer: 59

## 2011-12-26 ENCOUNTER — Encounter (HOSPITAL_COMMUNITY): Payer: Self-pay | Admitting: *Deleted

## 2011-12-26 ENCOUNTER — Emergency Department (HOSPITAL_COMMUNITY)
Admission: EM | Admit: 2011-12-26 | Discharge: 2011-12-26 | Discharge status: Against Medical Advice | Payer: 59 | Attending: Emergency Medicine | Admitting: Emergency Medicine

## 2011-12-26 DIAGNOSIS — Z0389 Encounter for observation for other suspected diseases and conditions ruled out: Secondary | ICD-10-CM | POA: Insufficient documentation
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
## 2012-01-05 ENCOUNTER — Encounter: Payer: Self-pay | Admitting: Obstetrics & Gynecology

## 2012-01-05 ENCOUNTER — Ambulatory Visit (INDEPENDENT_AMBULATORY_CARE_PROVIDER_SITE_OTHER): Payer: 59 | Admitting: Obstetrics & Gynecology

## 2012-01-05 ENCOUNTER — Ambulatory Visit: Payer: 59 | Admitting: Obstetrics & Gynecology

## 2012-01-05 VITALS — BP 105/72 | HR 82 | Ht 65.0 in | Wt 162.0 lb

## 2012-01-05 DIAGNOSIS — IMO0002 Reserved for concepts with insufficient information to code with codable children: Secondary | ICD-10-CM

## 2012-01-05 DIAGNOSIS — R87612 Low grade squamous intraepithelial lesion on cytologic smear of cervix (LGSIL): Secondary | ICD-10-CM

## 2012-01-05 DIAGNOSIS — Z113 Encounter for screening for infections with a predominantly sexual mode of transmission: Secondary | ICD-10-CM

## 2012-01-05 DIAGNOSIS — N898 Other specified noninflammatory disorders of vagina: Secondary | ICD-10-CM

## 2012-01-05 DIAGNOSIS — A499 Bacterial infection, unspecified: Secondary | ICD-10-CM

## 2012-01-05 DIAGNOSIS — N760 Acute vaginitis: Secondary | ICD-10-CM

## 2012-01-05 DIAGNOSIS — B9689 Other specified bacterial agents as the cause of diseases classified elsewhere: Secondary | ICD-10-CM

## 2012-01-05 NOTE — Patient Instructions (Signed)
Return to clinic for any scheduled appointments or for any gynecologic concerns as needed.   

## 2012-01-05 NOTE — Progress Notes (Addendum)
History:  36 y.o. [id number] here today for follow up pap smear after LGSIL pap and negative colposcopy biopsies and ECC in 05/2011.  She also reports having pruritic, thick, white discharge for two days.  No abnormal bleeding or any others.  She desires STI screen, reports not having intercourse for several months, denies any exposure but wants testing done today.   The following portions of the patient's history were reviewed and updated as appropriate: allergies, current medications, past family history, past medical history, past social history, past surgical history and problem list.  Review of Systems:  Pertinent items are noted in HPI.  Objective:  Physical Exam Blood pressure 105/72, pulse 82, height 5\' 5"  (1.651 m), weight 162 lb (73.483 kg). Gen: NAD Abd: Soft, nontender and nondistended Pelvic: Normal appearing external genitalia; normal appearing vaginal mucosa and cervix. Pap done, small amount of bleeding noted after pap.  Thick, white discharge seen, wet prep sample obtained.  Small uterus, no other palpable masses, no uterine or adnexal tenderness  Assessment & Plan:  Pap smear sent; reflex GC/Chlam ordered Wet prep ordered Patient to return for blood draw for other STIs For her cervical dysplasia, patient needs pap smears every six months until she has two consecutive normal pap smears, then she can resume annual screening. If any pap smear is abnormal, she will need repeat colposcopy and appropriate evaluation.   01/06/12 Addendum Wet prep showed clue cells, Metronidazole e-prescribed. Patient called and a voicemail message was left to inform her of diagnosis and treatment, and to go pick up the prescription. Counseled about antabuse reaction that can happen with alcohol and metronidazole.  She was also told to call the clinic with any further questions or further concerns.

## 2012-01-06 LAB — WET PREP, GENITAL: Yeast Wet Prep HPF POC: NONE SEEN

## 2012-01-06 MED ORDER — METRONIDAZOLE 500 MG PO TABS: 500.0000 mg | ORAL_TABLET | Freq: Two times a day (BID) | ORAL | Status: AC

## 2012-01-06 NOTE — Progress Notes (Signed)
Addended by: Jaynie Collins A on: 01/06/2012 11:08 AM   Modules accepted: Orders

## 2012-01-10 NOTE — Progress Notes (Signed)
Pap returned as LGSIL.  Patient needs repeat colposcopy and ECC.

## 2012-01-18 NOTE — Progress Notes (Signed)
Left message for patient to call me for test results.

## 2012-01-31 ENCOUNTER — Encounter: Payer: Self-pay | Admitting: Obstetrics & Gynecology

## 2012-01-31 ENCOUNTER — Ambulatory Visit (INDEPENDENT_AMBULATORY_CARE_PROVIDER_SITE_OTHER): Payer: 59 | Admitting: Obstetrics & Gynecology

## 2012-01-31 VITALS — BP 136/90 | HR 82 | Ht 65.0 in | Wt 164.0 lb

## 2012-01-31 DIAGNOSIS — R87612 Low grade squamous intraepithelial lesion on cytologic smear of cervix (LGSIL): Secondary | ICD-10-CM

## 2012-01-31 DIAGNOSIS — IMO0002 Reserved for concepts with insufficient information to code with codable children: Secondary | ICD-10-CM

## 2012-01-31 NOTE — Patient Instructions (Addendum)
Colposcopy Care After Colposcopy is a procedure in which a special tool is used to magnify the surface of the cervix. A tissue sample (biopsy) may also be taken. This sample will be looked at for cervical cancer or other problems. After the test:  You may have some cramping.   Lie down for a few minutes if you feel lightheaded.    You may have some bleeding which should stop in a few days.  HOME CARE  Do not have sex or use tampons for 2 to 3 days or as told.   Only take medicine as told by your doctor.   Continue to take your birth control pills as usual.  Finding out the results of your test Ask when your test results will be ready. Make sure you get your test results. GET HELP RIGHT AWAY IF:  You are bleeding a lot or are passing blood clots.   You develop a fever of 102 F (38.9 C) or higher.   You have abnormal vaginal discharge.   You have cramps that do not go away with medicine.   You feel lightheaded, dizzy, or pass out (faint).  MAKE SURE YOU:   Understand these instructions.   Will watch your condition.   Will get help right away if you are not doing well or get worse.  Document Released: 01/25/2008 Document Revised: 07/28/2011 Document Reviewed: 01/25/2008 Exit[employer] Patient Information 2012 [employer]. 

## 2012-01-31 NOTE — Progress Notes (Signed)
  Colposcopy Procedure Note  Indications: Pap smear on 05/10/11 showed: low-grade squamous intraepithelial neoplasia (LGSIL - encompassing HPV,mild dysplasia,CIN I).  This was followed by a colposcopy in 05/2011 that showed benign biopsies and ECC.  Follow up pap smear on 01/05/12 showed LGSIL.  She is here today for repeat colposcopy, biopsies and ECC. Prior cervical treatment: cryosurgery and this occured when she was 36 years old. Patient understands the role of HPV in leading to cervical dysplasia or neoplasia. .  Procedure Details  The risks and benefits of the procedure and Written informed consent obtained.  Speculum placed in vagina and excellent visualization of cervix achieved, cervix swabbed x 3 with acetic acid solution.  Findings: Cervix: acetowhite lesion(s) noted at 9 and 11 o'clock; cervical biopsies taken at these sites.  ECC done.  Hemostasis achieved with Monsel's solution.  Specimens: Cervical biopsies and ECC  Complications: None.  Plan: Specimens labelled and sent to Pathology. Will base further treatment on Pathology findings. Post biopsy instructions given to patient.

## 2012-02-03 ENCOUNTER — Encounter: Payer: Self-pay | Admitting: Obstetrics & Gynecology

## 2012-02-03 NOTE — Progress Notes (Signed)
Quick Note:  Cervical Dysplasia Overview 04/2011 LGSIL pap followed by benign colposcopis biopsies and ECC 12/2011 LGSIL pap followed by benign colposcopic biopsies and ECC As per updated ASCCP guidelines, she needs cotesting (pap and HPV tests) in one year. Please call to inform patient that she will need to return for appointment in one year If LGSIL cytology persists for more than 2 years , she will need cryotherapy vs LEEP  Jaynie Collins, M.D. 02/03/2012 7:46 AM   ______

## 2012-02-15 ENCOUNTER — Encounter: Payer: 59 | Admitting: Obstetrics and Gynecology

## 2012-02-15 ENCOUNTER — Encounter: Payer: 59 | Admitting: Physician Assistant

## 2012-02-16 NOTE — Progress Notes (Signed)
Spoke to patient . Patient is aware of result. Will follow up in one year.

## 2012-10-01 ENCOUNTER — Telehealth: Payer: Self-pay | Admitting: *Deleted

## 2012-10-01 DIAGNOSIS — B373 Candidiasis of vulva and vagina: Secondary | ICD-10-CM

## 2012-10-01 MED ORDER — FLUCONAZOLE 150 MG PO TABS: 150.0000 mg | ORAL_TABLET | Freq: Once | ORAL | Status: DC

## 2012-10-01 NOTE — Telephone Encounter (Signed)
Patient has itching that started Saturday on the outside, no increase in discharge or odor.  She would like a diflucan called in.

## 2012-11-20 DIAGNOSIS — Z8719 Personal history of other diseases of the digestive system: Secondary | ICD-10-CM

## 2012-11-20 HISTORY — DX: Personal history of other diseases of the digestive system: Z87.19

## 2012-11-23 ENCOUNTER — Encounter (HOSPITAL_COMMUNITY): Payer: Self-pay | Admitting: *Deleted

## 2012-11-23 ENCOUNTER — Inpatient Hospital Stay (HOSPITAL_COMMUNITY)
Admission: AD | Admit: 2012-11-23 | Discharge: 2012-11-23 | Disposition: A | Discharge status: Home / Self Care | Payer: 59 | Source: Ambulatory Visit | Attending: Obstetrics and Gynecology | Admitting: Obstetrics and Gynecology

## 2012-11-23 ENCOUNTER — Inpatient Hospital Stay (HOSPITAL_COMMUNITY): Payer: 59

## 2012-11-23 DIAGNOSIS — N946 Dysmenorrhea, unspecified: Secondary | ICD-10-CM

## 2012-11-23 DIAGNOSIS — N760 Acute vaginitis: Secondary | ICD-10-CM | POA: Insufficient documentation

## 2012-11-23 DIAGNOSIS — B9689 Other specified bacterial agents as the cause of diseases classified elsewhere: Secondary | ICD-10-CM | POA: Insufficient documentation

## 2012-11-23 DIAGNOSIS — A499 Bacterial infection, unspecified: Secondary | ICD-10-CM | POA: Insufficient documentation

## 2012-11-23 DIAGNOSIS — R109 Unspecified abdominal pain: Secondary | ICD-10-CM | POA: Insufficient documentation

## 2012-11-23 DIAGNOSIS — R141 Gas pain: Secondary | ICD-10-CM

## 2012-11-23 LAB — URINALYSIS, ROUTINE W REFLEX MICROSCOPIC
Glucose, UA: NEGATIVE mg/dL
Ketones, ur: 15 mg/dL — AB
Leukocytes, UA: NEGATIVE
Nitrite: NEGATIVE
Protein, ur: NEGATIVE mg/dL
pH: 7 (ref 5.0–8.0)

## 2012-11-23 LAB — WET PREP, GENITAL
Trich, Wet Prep: NONE SEEN
Yeast Wet Prep HPF POC: NONE SEEN

## 2012-11-23 LAB — URINE MICROSCOPIC-ADD ON

## 2012-11-23 MED ORDER — METRONIDAZOLE 500 MG PO TABS: 500.0000 mg | ORAL_TABLET | Freq: Two times a day (BID) | ORAL | Status: DC

## 2012-11-23 NOTE — MAU Note (Signed)
Pain on lower left woke her wed night/thur morning.  Has not changed or gotten worse.   Lots of gas moving around, not used to having that.  Period started on Wed as expected.  Did not check temp, " felt warm and hands were cold.

## 2012-11-23 NOTE — MAU Note (Signed)
Name and DOB verified. Pt confirms spelling is correct.

## 2012-11-23 NOTE — MAU Note (Signed)
Patient states she started having left lower abdominal pain yesterday morning and is not going away. Worse with movement and feels like gas moving. Currently on period.

## 2012-11-23 NOTE — MAU Provider Note (Signed)
History     CSN: [id number]  Arrival date and time: 11/23/12 7829   First Provider Initiated Contact with Patient 11/23/12 1001      Chief Complaint  Patient presents with  . Abdominal Pain   Abdominal Pain Pertinent negatives include no constipation, diarrhea, melena, nausea or vomiting. Fever: subjective fever.   [name] is a 37 y.o.  female who presents with 1 day of 6/10 left lower abdominal pain/pressure/bloating she states the pain woke her up from bed around 430 am yesterday. She states that her menstrual period started Weds, she reports that bloating and gas are normal for her during her period but the pain and subjective fever is what brought her in. Her last bowel movement was last night, she states it was normal and did relieve some of the gas/pressure but it returned shortly after. She tried ibuprofen and tums she states they did not help.    OB History   Grav Para Term Preterm Abortions TAB SAB Ect Mult Living   2 2 1 1      1       Past Medical History  Diagnosis Date  . Protein C deficiency   . Asthma   . Infection     UTI  . Abnormal Pap smear     cryo age 63;colposcopy 2013    Past Surgical History  Procedure Laterality Date  . Cesarean section  06/08/00  . Cesarean section  11/05/04    Family History  Problem Relation Age of Onset  . Breast cancer Maternal Aunt   . Colon cancer Maternal Aunt   . Hypertension Mother   . Hypertension Father   . Diabetes Father   . Stroke Paternal Grandmother   . Cancer Paternal Grandfather     prostate    History  Substance Use Topics  . Smoking status: Never Smoker   . Smokeless tobacco: Never Used  . Alcohol Use: Yes    Allergies: No Known Allergies  Prescriptions prior to admission  Medication Sig Dispense Refill  . cetirizine (ZYRTEC) 10 MG tablet Take 10 mg by mouth daily.        Marland Kitchen ibuprofen (ADVIL,MOTRIN) 400 MG tablet Take 400 mg by mouth every 6 (six) hours as needed. For pain relief      .  Multiple Vitamin (MULITIVITAMIN WITH MINERALS) TABS Take 1 tablet by mouth daily.        Review of Systems  Constitutional: Positive for chills and diaphoresis. Fever: subjective fever.  HENT: Negative.   Respiratory: Negative.   Cardiovascular: Negative.   Gastrointestinal: Positive for abdominal pain. Negative for heartburn, nausea, vomiting, diarrhea, constipation, blood in stool and melena.  Genitourinary: Negative.   Neurological: Negative for dizziness.   Physical Exam   Blood pressure 122/76, pulse 104, temperature 99.6 F (37.6 C), temperature source Oral, resp. rate 16, height 5' 5.5" (1.664 m), weight 71.033 kg (156 lb 9.6 oz), last menstrual period 11/21/2012, SpO2 100.00%.  Physical Exam  Constitutional: She is oriented to person, place, and time. She appears well-developed and well-nourished.  HENT:  Head: Normocephalic and atraumatic.  Eyes: EOM are normal.  Cardiovascular: Normal rate, regular rhythm and intact distal pulses.  Exam reveals no gallop and no friction rub.   No murmur heard. Respiratory: Effort normal and breath sounds normal. No respiratory distress. She has no wheezes. She has no rales. She exhibits no tenderness.  GI: Soft. Bowel sounds are normal. She exhibits no distension and no mass. There is  tenderness (LLQ bloating). There is no rebound and no guarding.  Genitourinary: Vagina normal. Uterus is not enlarged and not tender. Cervix exhibits discharge (small amount of thin white discharge and some blood noted at the cervical os and in the vagina ). Cervix exhibits no motion tenderness and no friability. Right adnexum displays no mass and no tenderness. Left adnexum displays tenderness. Left adnexum displays no mass.  Neurological: She is alert and oriented to person, place, and time. She has normal reflexes.  Skin: Skin is warm and dry.  Psychiatric: She has a normal mood and affect.   Results for orders placed during the hospital encounter of 11/23/12  (from the past 24 hour(s))  URINALYSIS, ROUTINE W REFLEX MICROSCOPIC     Status: Abnormal   Collection Time    11/23/12  9:30 AM      Result Value Range   Color, Urine YELLOW  YELLOW   APPearance CLEAR  CLEAR   Specific Gravity, Urine 1.020  1.005 - 1.030   pH 7.0  5.0 - 8.0   Glucose, UA NEGATIVE  NEGATIVE mg/dL   Hgb urine dipstick LARGE (*) NEGATIVE   Bilirubin Urine NEGATIVE  NEGATIVE   Ketones, ur 15 (*) NEGATIVE mg/dL   Protein, ur NEGATIVE  NEGATIVE mg/dL   Urobilinogen, UA 2.0 (*) 0.0 - 1.0 mg/dL   Nitrite NEGATIVE  NEGATIVE   Leukocytes, UA NEGATIVE  NEGATIVE  URINE MICROSCOPIC-ADD ON     Status: None   Collection Time    11/23/12  9:30 AM      Result Value Range   Squamous Epithelial / LPF RARE  RARE   RBC / HPF 21-50  <3 RBC/hpf  POCT PREGNANCY, URINE     Status: None   Collection Time    11/23/12  9:43 AM      Result Value Range   Preg Test, Ur NEGATIVE  NEGATIVE  WET PREP, GENITAL     Status: Abnormal   Collection Time    11/23/12 10:53 AM      Result Value Range   Yeast Wet Prep HPF POC NONE SEEN  NONE SEEN   Trich, Wet Prep NONE SEEN  NONE SEEN   Clue Cells Wet Prep HPF POC FEW (*) NONE SEEN   WBC, Wet Prep HPF POC FEW (*) NONE SEEN   US Transvaginal Non-ob  11/23/2012  *RADIOLOGY REPORT*  Clinical Data: Left lower quadrant pain for 1 day.  LMP 11/21/2012 with current menses  TRANSABDOMINAL AND TRANSVAGINAL ULTRASOUND OF PELVIS Technique:  Both transabdominal and transvaginal ultrasound examinations of the pelvis were performed. Transabdominal technique was performed for global imaging of the pelvis including uterus, ovaries, adnexal regions, and pelvic cul-de-sac.  It was necessary to proceed with endovaginal exam following the transabdominal exam to visualize the myometrium, endometrium and adnexa.  Comparison:  None  Findings:  Uterus: Is anteverted and anteflexed and has a sagittal length of 8.5 cm, depth of 4.5 cm and width of 5.1 cm.  A homogeneous myometrium  is seen  Endometrium: Is homogeneously echogenic with a width of 5.6 mm.  No areas of focal thickening or heterogeneity are seen  Right ovary:  Measures 3.6 x 1.8 x 3.0 cm and contains a corpus luteum  Left ovary: Measures 3.4 x 1.7 by 1.9 cm and has a normal appearance  Other findings: A small amount of simple free fluid is noted in the cul-de-sac.  No separate adnexal masses are seen.  IMPRESSION: Unremarkable pelvic ultrasound.  Original Report Authenticated By: Rhodia Albright, M.D.    US Pelvis Complete  11/23/2012  *RADIOLOGY REPORT*  Clinical Data: Left lower quadrant pain for 1 day.  LMP 11/21/2012 with current menses  TRANSABDOMINAL AND TRANSVAGINAL ULTRASOUND OF PELVIS Technique:  Both transabdominal and transvaginal ultrasound examinations of the pelvis were performed. Transabdominal technique was performed for global imaging of the pelvis including uterus, ovaries, adnexal regions, and pelvic cul-de-sac.  It was necessary to proceed with endovaginal exam following the transabdominal exam to visualize the myometrium, endometrium and adnexa.  Comparison:  None  Findings:  Uterus: Is anteverted and anteflexed and has a sagittal length of 8.5 cm, depth of 4.5 cm and width of 5.1 cm.  A homogeneous myometrium is seen  Endometrium: Is homogeneously echogenic with a width of 5.6 mm.  No areas of focal thickening or heterogeneity are seen  Right ovary:  Measures 3.6 x 1.8 x 3.0 cm and contains a corpus luteum  Left ovary: Measures 3.4 x 1.7 by 1.9 cm and has a normal appearance  Other findings: A small amount of simple free fluid is noted in the cul-de-sac.  No separate adnexal masses are seen.  IMPRESSION: Unremarkable pelvic ultrasound.   Original Report Authenticated By: Rhodia Albright, M.D.     MAU Course  Procedures None  MDM Speculum exam, bimanual. UA, wet prep, GC/C. Ultrasound.   Assessment and Plan  A: bacterial vaginosis. Pelvis Ultrasound was unremarkable.   Normal bowel gas  P:   Discharge home Rx for flagyl 500mg  BID x 7 days sent to patient's pharmacy Discussed hygiene products and probiotics for avoiding recurrence of BV Patient encouraged to follow-up with [facility] if symptoms continue or worsen Patient may return to MAU as needed or if her condition were to change or worsen  Rosalee Kaufman 11/23/2012, 10:06 AM   I have seen and evaluated this patient with the PA student. I have edited/added to the note above to reflect my observations. I agree with the assessment and plan as written above.   Freddi Starr, PA-C 11/23/2012 5:22 PM

## 2012-11-23 NOTE — MAU Provider Note (Signed)
Attestation of Attending Supervision of Advanced Practitioner (CNM/NP): Evaluation and management procedures were performed by the Advanced Practitioner under my supervision and collaboration.  I have reviewed the Advanced Practitioner's note and chart, and I agree with the management and plan.  Kyrell Ruacho 11/23/2012 7:55 PM

## 2012-11-24 ENCOUNTER — Emergency Department (HOSPITAL_COMMUNITY): Payer: 59

## 2012-11-24 ENCOUNTER — Emergency Department (HOSPITAL_COMMUNITY)
Admission: EM | Admit: 2012-11-24 | Discharge: 2012-11-24 | Disposition: A | Discharge status: Home / Self Care | Payer: 59 | Attending: Emergency Medicine | Admitting: Emergency Medicine

## 2012-11-24 ENCOUNTER — Encounter (HOSPITAL_COMMUNITY): Payer: Self-pay

## 2012-11-24 DIAGNOSIS — K5792 Diverticulitis of intestine, part unspecified, without perforation or abscess without bleeding: Secondary | ICD-10-CM

## 2012-11-24 DIAGNOSIS — Z87440 Personal history of urinary (tract) infections: Secondary | ICD-10-CM | POA: Insufficient documentation

## 2012-11-24 DIAGNOSIS — Z8742 Personal history of other diseases of the female genital tract: Secondary | ICD-10-CM | POA: Insufficient documentation

## 2012-11-24 DIAGNOSIS — D6859 Other primary thrombophilia: Secondary | ICD-10-CM | POA: Insufficient documentation

## 2012-11-24 DIAGNOSIS — Z79899 Other long term (current) drug therapy: Secondary | ICD-10-CM | POA: Insufficient documentation

## 2012-11-24 DIAGNOSIS — J45909 Unspecified asthma, uncomplicated: Secondary | ICD-10-CM | POA: Insufficient documentation

## 2012-11-24 DIAGNOSIS — K5732 Diverticulitis of large intestine without perforation or abscess without bleeding: Secondary | ICD-10-CM | POA: Insufficient documentation

## 2012-11-24 LAB — COMPREHENSIVE METABOLIC PANEL
ALT: 14 U/L (ref 0–35)
Alkaline Phosphatase: 61 U/L (ref 39–117)
BUN: 9 mg/dL (ref 6–23)
CO2: 23 mEq/L (ref 19–32)
Chloride: 102 mEq/L (ref 96–112)
GFR calc Af Amer: >90 mL/min (ref >90)
GFR calc non Af Amer: >90 mL/min (ref >90)
Glucose, Bld: 119 mg/dL — ABNORMAL HIGH (ref 70–99)
Potassium: 3.1 mEq/L — ABNORMAL LOW (ref 3.5–5.1)
Sodium: 134 mEq/L — ABNORMAL LOW (ref 135–145)
Total Bilirubin: 1.6 mg/dL — ABNORMAL HIGH (ref 0.3–1.2)
Total Protein: 7.2 g/dL (ref 6.0–8.3)

## 2012-11-24 LAB — GC/CHLAMYDIA PROBE AMP
CT Probe RNA: NEGATIVE
GC Probe RNA: NEGATIVE

## 2012-11-24 LAB — CBC WITH DIFFERENTIAL/PLATELET
Hemoglobin: 11.9 g/dL — ABNORMAL LOW (ref 12.0–15.0)
Lymphocytes Relative: 5 % — ABNORMAL LOW (ref 12–46)
Lymphs Abs: 0.8 10*3/uL (ref 0.7–4.0)
Monocytes Relative: 10 % (ref 3–12)
Neutro Abs: 13.2 10*3/uL — ABNORMAL HIGH (ref 1.7–7.7)
Neutrophils Relative %: 85 % — ABNORMAL HIGH (ref 43–77)
Platelets: 224 10*3/uL (ref 150–400)
RBC: 3.84 MIL/uL — ABNORMAL LOW (ref 3.87–5.11)
WBC: 15.6 10*3/uL — ABNORMAL HIGH (ref 4.0–10.5)

## 2012-11-24 LAB — CG4 I-STAT (LACTIC ACID): Lactic Acid, Venous: 1.03 mmol/L (ref 0.5–2.2)

## 2012-11-24 MED ORDER — CIPROFLOXACIN HCL 500 MG PO TABS: 500.0000 mg | ORAL_TABLET | Freq: Two times a day (BID) | ORAL | Status: DC

## 2012-11-24 MED ORDER — IOHEXOL 300 MG/ML  SOLN
100.0000 mL | Freq: Once | INTRAMUSCULAR | Status: AC | PRN
  Administered 2012-11-24: 100 mL via INTRAVENOUS

## 2012-11-24 MED ORDER — OXYCODONE-ACETAMINOPHEN 5-325 MG PO TABS: 2.0000 | ORAL_TABLET | ORAL | Status: DC | PRN

## 2012-11-24 MED ORDER — SODIUM CHLORIDE 0.9 % IV BOLUS (SEPSIS)
2000.0000 mL | Freq: Once | INTRAVENOUS | Status: AC
  Administered 2012-11-24: 2000 mL via INTRAVENOUS

## 2012-11-24 MED ORDER — METRONIDAZOLE 500 MG PO TABS: 500.0000 mg | ORAL_TABLET | Freq: Two times a day (BID) | ORAL | Status: DC

## 2012-11-24 MED ORDER — SODIUM CHLORIDE 0.9 % IV SOLN: INTRAVENOUS | Status: DC

## 2012-11-24 MED ORDER — IOHEXOL 300 MG/ML  SOLN: 50.0000 mL | Freq: Once | INTRAMUSCULAR | Status: DC | PRN

## 2012-11-24 MED ORDER — HYDROMORPHONE HCL PF 1 MG/ML IJ SOLN
1.0000 mg | Freq: Once | INTRAMUSCULAR | Status: AC
  Administered 2012-11-24: 1 mg via INTRAVENOUS
  Filled 2012-11-24: qty 1

## 2012-11-24 NOTE — ED Notes (Signed)
Pt states she began having foul/painful gas on Thursday along with LLQ pain.  Pt was seen at [facility] yesterday and had US Pelvis completed.  Pt d/c'd home as nothing was identified as the problem.  Pt states last BM was yesterday and normal for her.

## 2012-11-24 NOTE — ED Provider Notes (Signed)
History     CSN: [id number]  Arrival date & time 11/24/12  1141   First MD Initiated Contact with Patient 11/24/12 1220      Chief Complaint  Patient presents with  . Abdominal Pain  . GI Problem    (Consider location/radiation/quality/duration/timing/severity/associated sxs/prior treatment) HPI This 37 year old female is 3 days of constant unchanging non-colicky moderately severe left lower quadrant abdominal pain worse with position changes and palpation without radiation or associated symptoms, she is no fever chest pain cough shortness breath upper abdominal pain and right-sided abdominal pain nausea vomiting diarrhea or bloody stools dysuria vaginal bleeding or vaginal discharge. She had unremarkable pelvic ultrasound yesterday at [facility] hospital as well as negative pregnancy test and no evidence of urine infection. She was started on Flagyl for possible bacterial vaginosis. Past Medical History  Diagnosis Date  . Protein C deficiency   . Asthma   . Infection     UTI  . Abnormal Pap smear     cryo age 11;colposcopy 2013    Past Surgical History  Procedure Laterality Date  . Cesarean section  06/08/00  . Cesarean section  11/05/04    Family History  Problem Relation Age of Onset  . Breast cancer Maternal Aunt   . Colon cancer Maternal Aunt   . Hypertension Mother   . Hypertension Father   . Diabetes Father   . Stroke Paternal Grandmother   . Cancer Paternal Grandfather     prostate    History  Substance Use Topics  . Smoking status: Never Smoker   . Smokeless tobacco: Never Used  . Alcohol Use: Yes    OB History   Grav Para Term Preterm Abortions TAB SAB Ect Mult Living   2 2 1 1      1       Review of Systems 10 Systems reviewed and are negative for acute change except as noted in the HPI. Allergies  Review of patient's allergies indicates no known allergies.  Home Medications   Current Outpatient Rx  Name  Route  Sig  Dispense  Refill  .  cetirizine (ZYRTEC) 10 MG tablet   Oral   Take 10 mg by mouth daily.           Marland Kitchen ibuprofen (ADVIL,MOTRIN) 400 MG tablet   Oral   Take 400 mg by mouth every 6 (six) hours as needed for pain.          . metroNIDAZOLE (FLAGYL) 500 MG tablet   Oral   Take 500 mg by mouth 2 (two) times daily. For 7 days; Start date 11/23/12         . Multiple Vitamin (MULITIVITAMIN WITH MINERALS) TABS   Oral   Take 1 tablet by mouth daily.         . Simethicone (GAS-X PO)   Oral   Take 1 tablet by mouth 2 (two) times daily as needed (flatulence).         . ciprofloxacin (CIPRO) 500 MG tablet   Oral   Take 1 tablet (500 mg total) by mouth 2 (two) times daily. One po bid x 7 days   14 tablet   0   . metroNIDAZOLE (FLAGYL) 500 MG tablet   Oral   Take 1 tablet (500 mg total) by mouth 2 (two) times daily. One po bid x 7 days   14 tablet   0   . oxyCODONE-acetaminophen (PERCOCET) 5-325 MG per tablet   Oral   Take 2  tablets by mouth every 4 (four) hours as needed for pain.   20 tablet   0     BP 114/70  Pulse 115  Temp(Src) 101 F (38.3 C) (Oral)  Resp 17  SpO2 99%  LMP 11/21/2012  Physical Exam  Nursing note and vitals reviewed. Constitutional:  Awake, alert, nontoxic appearance.  HENT:  Head: Atraumatic.  Eyes: Right eye exhibits no discharge. Left eye exhibits no discharge.  Neck: Neck supple.  Cardiovascular: Normal rate and regular rhythm.   No murmur heard. Pulmonary/Chest: Effort normal and breath sounds normal. No respiratory distress. She has no wheezes. She has no rales. She exhibits no tenderness.  Abdominal: Soft. Bowel sounds are normal. She exhibits no distension and no mass. There is tenderness. There is guarding. There is no rebound.  Localized tenderness to the left lower quadrant with moderate tenderness with localized guarding with no rebound and the rest of the abdomen is nontender there is no CVA tenderness the patient already had a pelvic examination  yesterday at [facility] hospital that was unremarkable  Musculoskeletal: She exhibits no edema and no tenderness.  Baseline ROM, no obvious new focal weakness.  Neurological: She is alert.  Mental status and motor strength appears baseline for patient and situation.  Skin: No rash noted.  Psychiatric: She has a normal mood and affect.    ED Course  Procedures (including critical care time) Pt stable in ED with no significant deterioration in condition. Patient / Family / Caregiver informed of clinical course, understand medical decision-making process, and agree with plan. Labs Reviewed  CBC WITH DIFFERENTIAL - Abnormal; Notable for the following:    WBC 15.6 (*)    RBC 3.84 (*)    Hemoglobin 11.9 (*)    HCT 32.9 (*)    MCHC 36.2 (*)    Neutrophils Relative 85 (*)    Neutro Abs 13.2 (*)    Lymphocytes Relative 5 (*)    Monocytes Absolute 1.6 (*)    All other components within normal limits  COMPREHENSIVE METABOLIC PANEL - Abnormal; Notable for the following:    Sodium 134 (*)    Potassium 3.1 (*)    Glucose, Bld 119 (*)    Albumin 3.2 (*)    Total Bilirubin 1.6 (*)    All other components within normal limits  CG4 I-STAT (LACTIC ACID)   Ct Abdomen Pelvis W Contrast  11/24/2012  *RADIOLOGY REPORT*  Clinical Data: Left lower quadrant abdominal pain.  CT ABDOMEN AND PELVIS WITH CONTRAST  Technique:  Multidetector CT imaging of the abdomen and pelvis was performed following the standard protocol during bolus administration of intravenous contrast.  Contrast: OMNIPAQUE IOHEXOL 300 MG/ML  SOLN  Comparison: None.  Findings: Segmental thickening and inflammation of the colon is noted at the juncture of the descending colon and proximal sigmoid colon.  At this level, colonic wall thickening approaches 1.5 cm. There is a small amount of adjacent extraluminal air without focal abscess.  Findings are most likely consistent with acute diverticulitis.  Given the significant wall thickening  present, follow-up is recommended to exclude tumor.  No associated bowel obstruction is identified.  Other bowel loops are unremarkable.  The liver, gallbladder, spleen, pancreas, adrenal glands and kidneys are within normal limits a small amount of free fluid is noted in the cul-de-sac of the pelvis.  No hernias are identified.  No enlarged lymph nodes. No bony abnormalities.  IMPRESSION: Segmental thickening and inflammation of the colon at the juncture of the  descending colon and proximal sigmoid colon.  Findings most likely reflect acute diverticulitis with a small amount of extraluminal air present and no liquefied abscess.  Given the prominent wall thickening present of the colon, recommend follow-up to exclude underlying tumor.   Original Report Authenticated By: Irish Lack, M.D.      1. Diverticulitis       MDM  I doubt any other EMC precluding discharge at this time including, but not necessarily limited to the following:sepsis, peritonitis.        Hurman Horn, MD 11/26/12 [phone]

## 2012-11-25 ENCOUNTER — Telehealth (HOSPITAL_COMMUNITY): Payer: Self-pay | Admitting: Emergency Medicine

## 2012-11-25 NOTE — ED Notes (Signed)
Call from pt/mother regarding new onset diarrhea and lymph node in neck which is swollen.  Advised to follow diet prescribed and if pt is having pain /new symptom to f/u w/PCP or return as needed.

## 2012-11-29 ENCOUNTER — Encounter: Payer: Self-pay | Admitting: Family Medicine

## 2012-11-29 ENCOUNTER — Ambulatory Visit (INDEPENDENT_AMBULATORY_CARE_PROVIDER_SITE_OTHER): Payer: 59 | Admitting: Family Medicine

## 2012-11-29 ENCOUNTER — Encounter: Payer: Self-pay | Admitting: Gastroenterology

## 2012-11-29 VITALS — BP 124/72 | HR 76 | Temp 98.2°F | Ht 65.0 in | Wt 152.5 lb

## 2012-11-29 DIAGNOSIS — IMO0002 Reserved for concepts with insufficient information to code with codable children: Secondary | ICD-10-CM

## 2012-11-29 DIAGNOSIS — K5732 Diverticulitis of large intestine without perforation or abscess without bleeding: Secondary | ICD-10-CM

## 2012-11-29 DIAGNOSIS — R6889 Other general symptoms and signs: Secondary | ICD-10-CM

## 2012-11-29 DIAGNOSIS — D6859 Other primary thrombophilia: Secondary | ICD-10-CM

## 2012-11-29 DIAGNOSIS — Z800 Family history of malignant neoplasm of digestive organs: Secondary | ICD-10-CM

## 2012-11-29 NOTE — Progress Notes (Signed)
Subjective:    Patient ID: REALITY DEJONGE, female    DOB: 04-18-1976, 37 y.o.   MRN: [id number]  HPI CC: new pt to establish  Recent diverticulitis dx at [facility]. Woke up Thursday morning  11/22/2012 with lower left abdominal pain, also with fever.  No BM changes.  Evaluated at [facility] hospital 11/23/2012 - was concerned with ovarian cyst but this was negative, dx with BV and treated with flagyl.  Didn't get better, continued fever so evaluated at [facility] with CT scan showing diverticulitis.  Started on cipro.  Treated with clear liquid diet.  Continued lower left quadrant pain but improving.  Gassiness has improved.  Never BM changes with this (possible mild constipation).  Some nausea with vomiting. All records reviewed. Unremarkable pelvic US. Wet prep positive for clue cells.  Upreg neg CT abd /pelvis IMPRESSION: Segmental thickening and inflammation of the colon at the juncture of the descending colon and proximal sigmoid colon. Findings most likely reflect acute diverticulitis with a small amount of extraluminal air present and no liquefied abscess. Given the prominent wall thickening present of the colon, recommend follow-up to exclude underlying tumor. Original Report Authenticated By: Irish Lack, M.D.   Worked 1/2 day on Monday, unable to stay full day 2/2 pain.  Went back to work Wednesday.  Requests FMLA paperwork filled out.  H/o protein C deficiency. Bruise on left thigh present for almost 1 year.  Does not recall injury.  Lives with 1 daughter (2006) Occupation: Clinical biochemist Edu: some college Activity: walks some Diet: good water, fruits/vegetables daily  Preventative: Well woman with OBG[facility] ([facility] Dr. Macon Large.  H/o abnormal paps, rpt due in 1 mo Tetanus - unsure, thinks about 10 yrs.  2005? Flu shot - does not receive. LMP 11/21/12  Medications and allergies reviewed and updated in chart.  Past histories reviewed and updated if relevant as  below. Patient Active Problem List  Diagnosis  . LGSIL (low grade squamous intraepithelial lesion) on Pap smear   Past Medical History  Diagnosis Date  . Protein C deficiency   . History of asthma     childhood  . Abnormal Pap smear     cryo age 56;colposcopy 2013  . History of diverticulitis of colon 11/2012   Past Surgical History  Procedure Laterality Date  . Cesarean section  06/08/00  . Cesarean section  11/05/04  . Colposcopy  2013  . Gynecologic cryosurgery  1999   History  Substance Use Topics  . Smoking status: Never Smoker   . Smokeless tobacco: Never Used  . Alcohol Use: Yes     Comment: social   Family History  Problem Relation Age of Onset  . Cancer Maternal Aunt 50    breast  . Cancer Maternal Aunt 40    colon  . Hypertension Mother   . Hypertension Father   . Diabetes Father   . Stroke Paternal Grandmother   . Cancer Paternal Grandfather     prostate  . Pulmonary embolism Cousin 28    pulm embolism   No Known Allergies Current Outpatient Prescriptions on File Prior to Visit  Medication Sig Dispense Refill  . cetirizine (ZYRTEC) 10 MG tablet Take 10 mg by mouth daily.        . ciprofloxacin (CIPRO) 500 MG tablet Take 1 tablet (500 mg total) by mouth 2 (two) times daily. One po bid x 7 days  14 tablet  0  . ibuprofen (ADVIL,MOTRIN) 400 MG tablet Take 400  mg by mouth every 6 (six) hours as needed for pain.       . metroNIDAZOLE (FLAGYL) 500 MG tablet Take 1 tablet (500 mg total) by mouth 2 (two) times daily. One po bid x 7 days  14 tablet  0  . Multiple Vitamin (MULITIVITAMIN WITH MINERALS) TABS Take 1 tablet by mouth daily.      . Simethicone (GAS-X PO) Take 1 tablet by mouth 2 (two) times daily as needed (flatulence).      Marland Kitchen oxyCODONE-acetaminophen (PERCOCET) 5-325 MG per tablet Take 2 tablets by mouth every 4 (four) hours as needed for pain.  20 tablet  0   No current facility-administered medications on file prior to visit.    Review of Systems   Constitutional: Positive for fever. Negative for chills, activity change, appetite change, fatigue and unexpected weight change.  HENT: Negative for hearing loss and neck pain.   Eyes: Negative for visual disturbance.  Respiratory: Negative for cough, chest tightness, shortness of breath and wheezing.   Cardiovascular: Negative for chest pain, palpitations and leg swelling.  Gastrointestinal: Positive for nausea and abdominal pain. Negative for vomiting, diarrhea, constipation, blood in stool and abdominal distention.  Genitourinary: Negative for hematuria and difficulty urinating.  Musculoskeletal: Negative for myalgias and arthralgias.  Skin: Negative for rash.  Neurological: Negative for dizziness, seizures, syncope and headaches.  Hematological: Does not bruise/bleed easily.  Psychiatric/Behavioral: Negative for dysphoric mood. The patient is not nervous/anxious.        Objective:   Physical Exam  Nursing note and vitals reviewed. Constitutional: She is oriented to person, place, and time. She appears well-developed and well-nourished. No distress.  HENT:  Head: Normocephalic and atraumatic.  Right Ear: Hearing, tympanic membrane, external ear and ear canal normal.  Left Ear: Hearing, tympanic membrane, external ear and ear canal normal.  Nose: Nose normal.  Mouth/Throat: Oropharynx is clear and moist. No oropharyngeal exudate.  Eyes: Conjunctivae and EOM are normal. Pupils are equal, round, and reactive to light. No scleral icterus.  Neck: Normal range of motion. Neck supple.  Cardiovascular: Normal rate, regular rhythm, normal heart sounds and intact distal pulses.   No murmur heard. Pulses:      Radial pulses are 2+ on the right side, and 2+ on the left side.  Pulmonary/Chest: Effort normal and breath sounds normal. No respiratory distress. She has no wheezes. She has no rales.  Abdominal: Soft. Normal appearance and bowel sounds are normal. She exhibits no distension and no  mass. There is no hepatosplenomegaly. There is tenderness (moderate) in the left lower quadrant. There is no rigidity, no rebound, no guarding and negative Murphy's sign.  Musculoskeletal: Normal range of motion. She exhibits no edema.  Lymphadenopathy:    She has no cervical adenopathy.  Neurological: She is alert and oriented to person, place, and time.  CN grossly intact, station and gait intact  Skin: Skin is warm and dry. No rash noted.  Hyperpigmented macule with underlying nodule L anterior upper thigh  Psychiatric: She has a normal mood and affect. Her behavior is normal. Judgment and thought content normal.        Assessment & Plan:

## 2012-11-29 NOTE — Assessment & Plan Note (Signed)
Has f/u with OBGYN.

## 2012-11-29 NOTE — Assessment & Plan Note (Signed)
New dx. Treating with cipro and flagyl, slowly improving. Given young age and fmhx colon cancer (aunt with dx age 35), I did recommend referral to GI for further eval/discussion of colonsocopy. Pt agrees with plan.

## 2012-11-29 NOTE — Patient Instructions (Addendum)
Finish antibiotics. We will call you when FMLA forms ready. I do recommend setting you up for GI evaluation in next few months to discuss possible colonoscopy. Let me know if any questions or concerns or not improving as expected. Good to meet you today.

## 2012-11-29 NOTE — Assessment & Plan Note (Signed)
Stable.       - Continue to monitor

## 2012-11-30 ENCOUNTER — Telehealth: Payer: Self-pay | Admitting: Family Medicine

## 2012-11-30 NOTE — Telephone Encounter (Signed)
Filled FMLA form and placed in Kim's box.

## 2012-11-30 NOTE — Telephone Encounter (Signed)
Faxed FMLA paperwork to employer and to patient as requested.

## 2012-12-19 ENCOUNTER — Telehealth: Payer: Self-pay

## 2012-12-19 NOTE — Telephone Encounter (Signed)
Please call pt.  Diverticulitis can have a variable course.  I would expect this still to gradually improve.  I wouldn't change her meds at this point.  I would keep the OV with GI as scheduled.  If worsening again, then let us know.  Thanks.

## 2012-12-19 NOTE — Telephone Encounter (Signed)
pt seen 11/29/12; pt still has LLQ discomfort comes and goes;pain level is 2-3;pt said pain is much better than when seen in office. No fever,no diarrhea and no N&V. Pt having normal BMs daily (usually 2-3 soft BMs per day). Pt took Flagyl and Cipro for one week. Pt wants to know if should still have discomfort on and off in LLQ of abdomen. Pt has appt with GI Dr Arlyce Dice 12/26/12.[facility]

## 2012-12-20 ENCOUNTER — Ambulatory Visit: Payer: 59 | Admitting: Gastroenterology

## 2012-12-20 HISTORY — PX: COLONOSCOPY: SHX174

## 2012-12-20 NOTE — Telephone Encounter (Signed)
Patient advised.

## 2012-12-26 ENCOUNTER — Encounter: Payer: Self-pay | Admitting: Gastroenterology

## 2012-12-26 ENCOUNTER — Ambulatory Visit (INDEPENDENT_AMBULATORY_CARE_PROVIDER_SITE_OTHER): Payer: 59 | Admitting: Gastroenterology

## 2012-12-26 VITALS — BP 106/80 | HR 84 | Ht 65.0 in | Wt 150.5 lb

## 2012-12-26 DIAGNOSIS — Z862 Personal history of diseases of the blood and blood-forming organs and certain disorders involving the immune mechanism: Secondary | ICD-10-CM | POA: Insufficient documentation

## 2012-12-26 DIAGNOSIS — K5732 Diverticulitis of large intestine without perforation or abscess without bleeding: Secondary | ICD-10-CM

## 2012-12-26 MED ORDER — NA SULFATE-K SULFATE-MG SULF 17.5-3.13-1.6 GM/177ML PO SOLN: 1.0000 | Freq: Once | ORAL | Status: DC

## 2012-12-26 NOTE — Patient Instructions (Addendum)

## 2012-12-26 NOTE — Progress Notes (Signed)
History of Present Illness: This 37 year old Afro-American female referred at the request of Dr. Sharen Hones for evaluation of abdominal pain and abnormal CT. Approximately 5 weeks ago developed severe left lower quadrant pain and fever. She was seen in the ER where a CT scan, which I reviewed, demonstrated a segmental thickening at the juncture between the descending and proximal sigmoid colon. There is a small amount of adjacent extraluminal air. Findings were suggestive of acute diverticulitis. She was given antibiotics. Pain has entirely subsided. She's had no antecedent GI complaints. There is no history of rectal bleeding or melena.  Patient has a history of protein C deficiency diagnosed when she had a miscarriage several years ago. She is on no medications has had no thromboembolic events.    Past Medical History  Diagnosis Date  . Protein C deficiency   . History of asthma     childhood  . Abnormal Pap smear     cryo age 43;colposcopy 2013  . History of diverticulitis of colon 11/2012   Past Surgical History  Procedure Laterality Date  . Cesarean section  06/08/00  . Cesarean section  11/05/04  . Colposcopy  2013  . Gynecologic cryosurgery  1999   family history includes Cancer (age of onset: 51) in her maternal aunt; Diabetes in her father; Hypertension in her father and mother; Prostate cancer in her paternal grandfather; Pulmonary embolism (age of onset: 83) in her cousin; and Stroke in her paternal grandmother. Current Outpatient Prescriptions  Medication Sig Dispense Refill  . cetirizine (ZYRTEC) 10 MG tablet Take 10 mg by mouth daily.        Marland Kitchen ibuprofen (ADVIL,MOTRIN) 400 MG tablet Take 400 mg by mouth every 6 (six) hours as needed for pain.       . Multiple Vitamin (MULITIVITAMIN WITH MINERALS) TABS Take 1 tablet by mouth daily.       No current facility-administered medications for this visit.   Allergies as of 12/26/2012  . (No Known Allergies)    reports that she has  never smoked. She has never used smokeless tobacco. She reports that  drinks alcohol. She reports that she does not use illicit drugs.     Review of Systems: Pertinent positive and negative review of systems were noted in the above HPI section. All other review of systems were otherwise negative.  Vital signs were reviewed in today's medical record Physical Exam: General: Well developed , well nourished, no acute distress Skin: anicteric Head: Normocephalic and atraumatic Eyes:  sclerae anicteric, EOMI Ears: Normal auditory acuity Mouth: No deformity or lesions Neck: Supple, no masses or thyromegaly Lungs: Clear throughout to auscultation Heart: Regular rate and rhythm; no murmurs, rubs or bruits Abdomen: Soft, non tender and non distended. No masses, hepatosplenomegaly or hernias noted. Normal Bowel sounds Rectal:deferred Musculoskeletal: Symmetrical with no gross deformities  Skin: No lesions on visible extremities Pulses:  Normal pulses noted Extremities: No clubbing, cyanosis, edema or deformities noted Neurological: Alert oriented x 4, grossly nonfocal Cervical Nodes:  No significant cervical adenopathy Inguinal Nodes: No significant inguinal adenopathy Psychological:  Alert and cooperative. Normal mood and affect

## 2012-12-26 NOTE — Assessment & Plan Note (Signed)
Findings on CT suggest acute diverticulitis. Ischemic colitis related to protein C. deficiency is a less likely consideration. Doubt neoplasm.  Recommendations #1 colonoscopy  Risks, alternatives, and complications of the procedure, including bleeding, perforation, and possible need for surgery, were explained to the patient.  Patient's questions were answered.

## 2012-12-27 ENCOUNTER — Telehealth: Payer: Self-pay | Admitting: *Deleted

## 2012-12-27 NOTE — Telephone Encounter (Signed)
Patient called and left a message that she is scheduled to have a colonoscopy with Dr. Arlyce Dice next week. She was asking if she will need to get you new FMLA paperwork, if he will need to fill out FMLA paperwork or if a note from him would be sufficient for her to take back to work? Do you have any idea?

## 2012-12-27 NOTE — Telephone Encounter (Signed)
I anticipate letter from him should suffice, if needs additional info or further form for work filled out to let us know and I will gladly fill out.

## 2012-12-28 NOTE — Telephone Encounter (Signed)
Message left advising patient. Instructed to call me back if further information or paperwork was required.

## 2013-01-01 ENCOUNTER — Ambulatory Visit (AMBULATORY_SURGERY_CENTER): Payer: 59 | Admitting: Gastroenterology

## 2013-01-01 ENCOUNTER — Encounter: Payer: Self-pay | Admitting: Gastroenterology

## 2013-01-01 VITALS — BP 117/79 | HR 66 | Temp 97.5°F | Resp 14 | Ht 65.0 in | Wt 150.0 lb

## 2013-01-01 DIAGNOSIS — K5732 Diverticulitis of large intestine without perforation or abscess without bleeding: Secondary | ICD-10-CM

## 2013-01-01 MED ORDER — SODIUM CHLORIDE 0.9 % IV SOLN: 500.0000 mL | INTRAVENOUS | Status: DC

## 2013-01-01 NOTE — Patient Instructions (Signed)
YOU HAD AN ENDOSCOPIC PROCEDURE TODAY AT THE [facility]: Refer to the procedure report that was given to you for any specific questions about what was found during the examination.  If the procedure report does not answer your questions, please call your gastroenterologist to clarify.  If you requested that your care partner not be given the details of your procedure findings, then the procedure report has been included in a sealed envelope for you to review at your convenience later.  YOU SHOULD EXPECT: Some feelings of bloating in the abdomen. Passage of more gas than usual.  Walking can help get rid of the air that was put into your GI tract during the procedure and reduce the bloating. If you had a lower endoscopy (such as a colonoscopy or flexible sigmoidoscopy) you may notice spotting of blood in your stool or on the toilet paper. If you underwent a bowel prep for your procedure, then you may not have a normal bowel movement for a few days.  DIET: Your first meal following the procedure should be a light meal and then it is ok to progress to your normal diet.  A half-sandwich or bowl of soup is an example of a good first meal.  Heavy or fried foods are harder to digest and may make you feel nauseous or bloated.  Likewise meals heavy in dairy and vegetables can cause extra gas to form and this can also increase the bloating.  Drink plenty of fluids but you should avoid alcoholic beverages for 24 hours.  ACTIVITY: Your care partner should take you home directly after the procedure.  You should plan to take it easy, moving slowly for the rest of the day.  You can resume normal activity the day after the procedure however you should NOT DRIVE or use heavy machinery for 24 hours (because of the sedation medicines used during the test).    SYMPTOMS TO REPORT IMMEDIATELY: A gastroenterologist can be reached at any hour.  During normal business hours, 8:30 AM to 5:00 PM Monday through Friday,  call [phone].  After hours and on weekends, please call the GI answering service at [phone] who will take a message and have the physician on call contact you.   Following lower endoscopy (colonoscopy or flexible sigmoidoscopy):  Excessive amounts of blood in the stool  Significant tenderness or worsening of abdominal pains  Swelling of the abdomen that is new, acute  Fever of 100F or higher    FOLLOW UP: If any biopsies were taken you will be contacted by phone or by letter within the next 1-3 weeks.  Call your gastroenterologist if you have not heard about the biopsies in 3 weeks.  Our staff will call the home number listed on your records the next business day following your procedure to check on you and address any questions or concerns that you may have at that time regarding the information given to you following your procedure. This is a courtesy call and so if there is no answer at the home number and we have not heard from you through the emergency physician on call, we will assume that you have returned to your regular daily activities without incident.  SIGNATURES/CONFIDENTIALITY: You and/or your care partner have signed paperwork which will be entered into your electronic medical record.  These signatures attest to the fact that that the information above on your After Visit Summary has been reviewed and is understood.  Full responsibility of the confidentiality   of this discharge information lies with you and/or your care-partner.     

## 2013-01-01 NOTE — Progress Notes (Signed)
Patient did not experience any of the following events: a burn prior to discharge; a fall within the facility; wrong site/side/patient/procedure/implant event; or a hospital transfer or hospital admission upon discharge from the facility. (G8907) Patient did not have preoperative order for IV antibiotic SSI prophylaxis. (G8918)  

## 2013-01-01 NOTE — Progress Notes (Signed)
Lidocaine-40mg IV prior to Propofol InductionPropofol given over incremental dosages 

## 2013-01-01 NOTE — Op Note (Signed)
[facility] [address] [place], [address]   COLONOSCOPY PROCEDURE REPORT  PATIENT: [name], [name]  MR#: [id number] BIRTHDATE: 1975/09/24 , 36  yrs. old GENDER: Female ENDOSCOPIST: Louis Meckel, MD REFERRED XB:JYNWGN Gutierrez, M.D. PROCEDURE DATE:  01/01/2013 PROCEDURE:   Colonoscopy, diagnostic ASA CLASS:   Class II INDICATIONS:follow up for previously diagnosed diverticulitis. MEDICATIONS: MAC sedation, administered by CRNA and propofol (Diprivan) 250mg  IV  DESCRIPTION OF PROCEDURE:   After the risks benefits and alternatives of the procedure were thoroughly explained, informed consent was obtained.  A digital rectal exam revealed no abnormalities of the rectum.   The LB [id number]  endoscope was introduced through the anus and advanced to the cecum, which was identified by both the appendix and ileocecal valve. No adverse events experienced.   The quality of the prep was excellent using Suprep  The instrument was then slowly withdrawn as the colon was fully examined.      COLON FINDINGS: There was moderate diverticulosis noted in the sigmoid colon with associated muscular hypertrophy.   Mild diverticulosis was noted in the ascending colon.   The colon mucosa was otherwise normal.  Retroflexed views revealed no abnormalities. The time to cecum=2 minutes 34 seconds.  Withdrawal time=6 minutes 18 seconds.  The scope was withdrawn and the procedure completed. COMPLICATIONS: There were no complications.  ENDOSCOPIC IMPRESSION: 1.   There was moderate diverticulosis noted in the sigmoid colon 2.   Mild diverticulosis was noted in the ascending colon 3.   The colon mucosa was otherwise normal  changes on colonoscopy consistent with moderate diverticular disease primarily in the left colon  RECOMMENDATIONS: High fiber diet   eSigned:  Louis Meckel, MD 01/01/2013 2:18 PM   cc:   PATIENT NAME:  [name], [name] MR#: [id number]

## 2013-01-02 ENCOUNTER — Telehealth: Payer: Self-pay | Admitting: *Deleted

## 2013-01-02 NOTE — Telephone Encounter (Signed)
  Follow up Call-  Call back number 01/01/2013  Post procedure Call Back phone  # [phone]  Permission to leave phone message Yes     Patient questions:  Message left to call us if necessary.

## 2013-01-07 ENCOUNTER — Encounter: Payer: Self-pay | Admitting: Family Medicine

## 2013-01-08 ENCOUNTER — Ambulatory Visit: Payer: 59 | Admitting: Obstetrics & Gynecology

## 2013-01-10 ENCOUNTER — Ambulatory Visit (INDEPENDENT_AMBULATORY_CARE_PROVIDER_SITE_OTHER): Payer: 59 | Admitting: Family Medicine

## 2013-01-10 ENCOUNTER — Encounter: Payer: Self-pay | Admitting: Family Medicine

## 2013-01-10 VITALS — BP 115/80 | HR 77 | Ht 65.0 in | Wt 151.0 lb

## 2013-01-10 DIAGNOSIS — Z1151 Encounter for screening for human papillomavirus (HPV): Secondary | ICD-10-CM

## 2013-01-10 DIAGNOSIS — Z01419 Encounter for gynecological examination (general) (routine) without abnormal findings: Secondary | ICD-10-CM

## 2013-01-10 DIAGNOSIS — Z113 Encounter for screening for infections with a predominantly sexual mode of transmission: Secondary | ICD-10-CM

## 2013-01-10 DIAGNOSIS — Z124 Encounter for screening for malignant neoplasm of cervix: Secondary | ICD-10-CM

## 2013-01-10 NOTE — Progress Notes (Signed)
  Subjective:     [name] is a 37 y.o. female and is here for a comprehensive physical exam. The patient reports no problems. Desires STD screen.  Having cycles q 4-6 wks.  Interested in more permanent sterilization.  History   Social History  . Marital Status: Divorced    Spouse Name: N/A    Number of Children: 1  . Years of Education: N/A   Occupational History  . customer service [employer]   Social History Main Topics  . Smoking status: Never Smoker   . Smokeless tobacco: Never Used  . Alcohol Use: Yes     Comment: social  . Drug Use: No  . Sexually Active: Yes    Birth Control/ Protection: Condom   Other Topics Concern  . Not on file   Social History Narrative   Lives with 1 daughter (2006)   Occupation: customer service   Edu: some college   Activity: walks some   Diet: good water, fruits/vegetables daily   Health Maintenance  Topic Date Due  . Tetanus/tdap  05/23/1997  . Influenza Vaccine  04/22/2013  . Pap Smear  01/05/2015    The following portions of the patient's history were reviewed and updated as appropriate: allergies, current medications, past family history, past medical history, past social history, past surgical history and problem list.  Review of Systems A comprehensive review of systems was negative.   Objective:    BP 115/80  Pulse 77  Ht 5\' 5"  (1.651 m)  Wt 151 lb (68.493 kg)  BMI 25.13 kg/m2  LMP 12/26/2012 General appearance: alert, cooperative and appears stated age Head: Normocephalic, without obvious abnormality, atraumatic Neck: no adenopathy, supple, symmetrical, trachea midline and thyroid not enlarged, symmetric, no tenderness/mass/nodules Lungs: clear to auscultation bilaterally Breasts: normal appearance, no masses or tenderness Heart: regular rate and rhythm, S1, S2 normal, no murmur, click, rub or gallop Abdomen: soft, non-tender; bowel sounds normal; no masses,  no organomegaly Pelvic: cervix normal in appearance,  external genitalia normal, no adnexal masses or tenderness, no cervical motion tenderness, uterus normal size, shape, and consistency and vagina normal without discharge Extremities: extremities normal, atraumatic, no cyanosis or edema Pulses: 2+ and symmetric Skin: Skin color, texture, turgor normal. No rashes or lesions Lymph nodes: Cervical, supraclavicular, and axillary nodes normal. Neurologic: Grossly normal    Assessment:    Healthy female exam.      Plan:  Pap today Full STD screen Information given on permanent sterilization.  She will explore with insurance company options.   See After Visit Summary for Counseling Recommendations

## 2013-01-10 NOTE — Patient Instructions (Addendum)
Preventive Care for Adults, Female A healthy lifestyle and preventive care can promote health and wellness. Preventive health guidelines for women include the following key practices.  A routine yearly physical is a good way to check with your caregiver about your health and preventive screening. It is a chance to share any concerns and updates on your health, and to receive a thorough exam.  Visit your dentist for a routine exam and preventive care every 6 months. Brush your teeth twice a day and floss once a day. Good oral hygiene prevents tooth decay and gum disease.  The frequency of eye exams is based on your age, health, family medical history, use of contact lenses, and other factors. Follow your caregiver's recommendations for frequency of eye exams.  Eat a healthy diet. Foods like vegetables, fruits, whole grains, low-fat dairy products, and lean protein foods contain the nutrients you need without too many calories. Decrease your intake of foods high in solid fats, added sugars, and salt. Eat the right amount of calories for you.Get information about a proper diet from your caregiver, if necessary.  Regular physical exercise is one of the most important things you can do for your health. Most adults should get at least 150 minutes of moderate-intensity exercise (any activity that increases your heart rate and causes you to sweat) each week. In addition, most adults need muscle-strengthening exercises on 2 or more days a week.  Maintain a healthy weight. The body mass index (BMI) is a screening tool to identify possible weight problems. It provides an estimate of body fat based on height and weight. Your caregiver can help determine your BMI, and can help you achieve or maintain a healthy weight.For adults 20 years and older:  A BMI below 18.5 is considered underweight.  A BMI of 18.5 to 24.9 is normal.  A BMI of 25 to 29.9 is considered overweight.  A BMI of 30 and above is  considered obese.  Maintain normal blood lipids and cholesterol levels by exercising and minimizing your intake of saturated fat. Eat a balanced diet with plenty of fruit and vegetables. Blood tests for lipids and cholesterol should begin at age 20 and be repeated every 5 years. If your lipid or cholesterol levels are high, you are over 50, or you are at high risk for heart disease, you may need your cholesterol levels checked more frequently.Ongoing high lipid and cholesterol levels should be treated with medicines if diet and exercise are not effective.  If you smoke, find out from your caregiver how to quit. If you do not use tobacco, do not start.  If you are pregnant, do not drink alcohol. If you are breastfeeding, be very cautious about drinking alcohol. If you are not pregnant and choose to drink alcohol, do not exceed 1 drink per day. One drink is considered to be 12 ounces (355 mL) of beer, 5 ounces (148 mL) of wine, or 1.5 ounces (44 mL) of liquor.  Avoid use of street drugs. Do not share needles with anyone. Ask for help if you need support or instructions about stopping the use of drugs.  High blood pressure causes heart disease and increases the risk of stroke. Your blood pressure should be checked at least every 1 to 2 years. Ongoing high blood pressure should be treated with medicines if weight loss and exercise are not effective.  If you are 55 to 35 years old, ask your caregiver if you should take aspirin to prevent strokes.  Diabetes   screening involves taking a blood sample to check your fasting blood sugar level. This should be done once every 3 years, after age 45, if you are within normal weight and without risk factors for diabetes. Testing should be considered at a younger age or be carried out more frequently if you are overweight and have at least 1 risk factor for diabetes.  Breast cancer screening is essential preventive care for women. You should practice "breast  self-awareness." This means understanding the normal appearance and feel of your breasts and may include breast self-examination. Any changes detected, no matter how small, should be reported to a caregiver. Women in their 20s and 30s should have a clinical breast exam (CBE) by a caregiver as part of a regular health exam every 1 to 3 years. After age 40, women should have a CBE every year. Starting at age 40, women should consider having a mammography (breast X-ray test) every year. Women who have a family history of breast cancer should talk to their caregiver about genetic screening. Women at a high risk of breast cancer should talk to their caregivers about having magnetic resonance imaging (MRI) and a mammography every year.  The Pap test is a screening test for cervical cancer. A Pap test can show cell changes on the cervix that might become cervical cancer if left untreated. A Pap test is a procedure in which cells are obtained and examined from the lower end of the uterus (cervix).  Women should have a Pap test starting at age 21.  Between ages 21 and 29, Pap tests should be repeated every 2 years.  Beginning at age 30, you should have a Pap test every 3 years as long as the past 3 Pap tests have been normal.  Some women have medical problems that increase the chance of getting cervical cancer. Talk to your caregiver about these problems. It is especially important to talk to your caregiver if a new problem develops soon after your last Pap test. In these cases, your caregiver may recommend more frequent screening and Pap tests.  The above recommendations are the same for women who have or have not gotten the vaccine for human papillomavirus (HPV).  If you had a hysterectomy for a problem that was not cancer or a condition that could lead to cancer, then you no longer need Pap tests. Even if you no longer need a Pap test, a regular exam is a good idea to make sure no other problems are  starting.  If you are between ages 65 and 70, and you have had normal Pap tests going back 10 years, you no longer need Pap tests. Even if you no longer need a Pap test, a regular exam is a good idea to make sure no other problems are starting.  If you have had past treatment for cervical cancer or a condition that could lead to cancer, you need Pap tests and screening for cancer for at least 20 years after your treatment.  If Pap tests have been discontinued, risk factors (such as a new sexual partner) need to be reassessed to determine if screening should be resumed.  The HPV test is an additional test that may be used for cervical cancer screening. The HPV test looks for the virus that can cause the cell changes on the cervix. The cells collected during the Pap test can be tested for HPV. The HPV test could be used to screen women aged 30 years and older, and should   be used in women of any age who have unclear Pap test results. After the age of 30, women should have HPV testing at the same frequency as a Pap test.  Colorectal cancer can be detected and often prevented. Most routine colorectal cancer screening begins at the age of 50 and continues through age 75. However, your caregiver may recommend screening at an earlier age if you have risk factors for colon cancer. On a yearly basis, your caregiver may provide home test kits to check for hidden blood in the stool. Use of a small camera at the end of a tube, to directly examine the colon (sigmoidoscopy or colonoscopy), can detect the earliest forms of colorectal cancer. Talk to your caregiver about this at age 50, when routine screening begins. Direct examination of the colon should be repeated every 5 to 10 years through age 75, unless early forms of pre-cancerous polyps or small growths are found.  Hepatitis C blood testing is recommended for all people born from 1945 through 1965 and any individual with known risks for hepatitis C.  Practice  safe sex. Use condoms and avoid high-risk sexual practices to reduce the spread of sexually transmitted infections (STIs). STIs include gonorrhea, chlamydia, syphilis, trichomonas, herpes, HPV, and human immunodeficiency virus (HIV). Herpes, HIV, and HPV are viral illnesses that have no cure. They can result in disability, cancer, and death. Sexually active women aged 25 and younger should be checked for chlamydia. Older women with new or multiple partners should also be tested for chlamydia. Testing for other STIs is recommended if you are sexually active and at increased risk.  Osteoporosis is a disease in which the bones lose minerals and strength with aging. This can result in serious bone fractures. The risk of osteoporosis can be identified using a bone density scan. Women ages 65 and over and women at risk for fractures or osteoporosis should discuss screening with their caregivers. Ask your caregiver whether you should take a calcium supplement or vitamin D to reduce the rate of osteoporosis.  Menopause can be associated with physical symptoms and risks. Hormone replacement therapy is available to decrease symptoms and risks. You should talk to your caregiver about whether hormone replacement therapy is right for you.  Use sunscreen with sun protection factor (SPF) of 30 or more. Apply sunscreen liberally and repeatedly throughout the day. You should seek shade when your shadow is shorter than you. Protect yourself by wearing long sleeves, pants, a wide-brimmed hat, and sunglasses year round, whenever you are outdoors.  Once a month, do a whole body skin exam, using a mirror to look at the skin on your back. Notify your caregiver of new moles, moles that have irregular borders, moles that are larger than a pencil eraser, or moles that have changed in shape or color.  Stay current with required immunizations.  Influenza. You need a dose every fall (or winter). The composition of the flu vaccine  changes each year, so being vaccinated once is not enough.  Pneumococcal polysaccharide. You need 1 to 2 doses if you smoke cigarettes or if you have certain chronic medical conditions. You need 1 dose at age 65 (or older) if you have never been vaccinated.  Tetanus, diphtheria, pertussis (Tdap, Td). Get 1 dose of Tdap vaccine if you are younger than age 65, are over 65 and have contact with an infant, are a healthcare worker, are pregnant, or simply want to be protected from whooping cough. After that, you need a Td   booster dose every 10 years. Consult your caregiver if you have not had at least 3 tetanus and diphtheria-containing shots sometime in your life or have a deep or dirty wound.  HPV. You need this vaccine if you are a woman age 26 or younger. The vaccine is given in 3 doses over 6 months.  Measles, mumps, rubella (MMR). You need at least 1 dose of MMR if you were born in 1957 or later. You may also need a second dose.  Meningococcal. If you are age 19 to 21 and a first-year college student living in a residence hall, or have one of several medical conditions, you need to get vaccinated against meningococcal disease. You may also need additional booster doses.  Zoster (shingles). If you are age 60 or older, you should get this vaccine.  Varicella (chickenpox). If you have never had chickenpox or you were vaccinated but received only 1 dose, talk to your caregiver to find out if you need this vaccine.  Hepatitis A. You need this vaccine if you have a specific risk factor for hepatitis A virus infection or you simply wish to be protected from this disease. The vaccine is usually given as 2 doses, 6 to 18 months apart.  Hepatitis B. You need this vaccine if you have a specific risk factor for hepatitis B virus infection or you simply wish to be protected from this disease. The vaccine is given in 3 doses, usually over 6 months. Preventive Services / Frequency Ages 19 to 39  Blood  pressure check.** / Every 1 to 2 years.  Lipid and cholesterol check.** / Every 5 years beginning at age 20.  Clinical breast exam.** / Every 3 years for women in their 20s and 30s.  Pap test.** / Every 2 years from ages 21 through 29. Every 3 years starting at age 30 through age 65 or 70 with a history of 3 consecutive normal Pap tests.  HPV screening.** / Every 3 years from ages 30 through ages 65 to 70 with a history of 3 consecutive normal Pap tests.  Hepatitis C blood test.** / For any individual with known risks for hepatitis C.  Skin self-exam. / Monthly.  Influenza immunization.** / Every year.  Pneumococcal polysaccharide immunization.** / 1 to 2 doses if you smoke cigarettes or if you have certain chronic medical conditions.  Tetanus, diphtheria, pertussis (Tdap, Td) immunization. / A one-time dose of Tdap vaccine. After that, you need a Td booster dose every 10 years.  HPV immunization. / 3 doses over 6 months, if you are 26 and younger.  Measles, mumps, rubella (MMR) immunization. / You need at least 1 dose of MMR if you were born in 1957 or later. You may also need a second dose.  Meningococcal immunization. / 1 dose if you are age 19 to 21 and a first-year college student living in a residence hall, or have one of several medical conditions, you need to get vaccinated against meningococcal disease. You may also need additional booster doses.  Varicella immunization.** / Consult your caregiver.  Hepatitis A immunization.** / Consult your caregiver. 2 doses, 6 to 18 months apart.  Hepatitis B immunization.** / Consult your caregiver. 3 doses usually over 6 months. Ages 40 to 64  Blood pressure check.** / Every 1 to 2 years.  Lipid and cholesterol check.** / Every 5 years beginning at age 20.  Clinical breast exam.** / Every year after age 40.  Mammogram.** / Every year beginning at age 40   and continuing for as long as you are in good health. Consult with your  caregiver.  Pap test.** / Every 3 years starting at age 30 through age 65 or 70 with a history of 3 consecutive normal Pap tests.  HPV screening.** / Every 3 years from ages 30 through ages 65 to 70 with a history of 3 consecutive normal Pap tests.  Fecal occult blood test (FOBT) of stool. / Every year beginning at age 50 and continuing until age 75. You may not need to do this test if you get a colonoscopy every 10 years.  Flexible sigmoidoscopy or colonoscopy.** / Every 5 years for a flexible sigmoidoscopy or every 10 years for a colonoscopy beginning at age 50 and continuing until age 75.  Hepatitis C blood test.** / For all people born from 1945 through 1965 and any individual with known risks for hepatitis C.  Skin self-exam. / Monthly.  Influenza immunization.** / Every year.  Pneumococcal polysaccharide immunization.** / 1 to 2 doses if you smoke cigarettes or if you have certain chronic medical conditions.  Tetanus, diphtheria, pertussis (Tdap, Td) immunization.** / A one-time dose of Tdap vaccine. After that, you need a Td booster dose every 10 years.  Measles, mumps, rubella (MMR) immunization. / You need at least 1 dose of MMR if you were born in 1957 or later. You may also need a second dose.  Varicella immunization.** / Consult your caregiver.  Meningococcal immunization.** / Consult your caregiver.  Hepatitis A immunization.** / Consult your caregiver. 2 doses, 6 to 18 months apart.  Hepatitis B immunization.** / Consult your caregiver. 3 doses, usually over 6 months. Ages 65 and over  Blood pressure check.** / Every 1 to 2 years.  Lipid and cholesterol check.** / Every 5 years beginning at age 20.  Clinical breast exam.** / Every year after age 40.  Mammogram.** / Every year beginning at age 40 and continuing for as long as you are in good health. Consult with your caregiver.  Pap test.** / Every 3 years starting at age 30 through age 65 or 70 with a 3  consecutive normal Pap tests. Testing can be stopped between 65 and 70 with 3 consecutive normal Pap tests and no abnormal Pap or HPV tests in the past 10 years.  HPV screening.** / Every 3 years from ages 30 through ages 65 or 70 with a history of 3 consecutive normal Pap tests. Testing can be stopped between 65 and 70 with 3 consecutive normal Pap tests and no abnormal Pap or HPV tests in the past 10 years.  Fecal occult blood test (FOBT) of stool. / Every year beginning at age 50 and continuing until age 75. You may not need to do this test if you get a colonoscopy every 10 years.  Flexible sigmoidoscopy or colonoscopy.** / Every 5 years for a flexible sigmoidoscopy or every 10 years for a colonoscopy beginning at age 50 and continuing until age 75.  Hepatitis C blood test.** / For all people born from 1945 through 1965 and any individual with known risks for hepatitis C.  Osteoporosis screening.** / A one-time screening for women ages 65 and over and women at risk for fractures or osteoporosis.  Skin self-exam. / Monthly.  Influenza immunization.** / Every year.  Pneumococcal polysaccharide immunization.** / 1 dose at age 65 (or older) if you have never been vaccinated.  Tetanus, diphtheria, pertussis (Tdap, Td) immunization. / A one-time dose of Tdap vaccine if you are over   65 and have contact with an infant, are a Research scientist (physical sciences), or simply want to be protected from whooping cough. After that, you need a Td booster dose every 10 years.  Varicella immunization.** / Consult your caregiver.  Meningococcal immunization.** / Consult your caregiver.  Hepatitis A immunization.** / Consult your caregiver. 2 doses, 6 to 18 months apart.  Hepatitis B immunization.** / Check with your caregiver. 3 doses, usually over 6 months. ** Family history and personal history of risk and conditions may change your caregiver's recommendations. Document Released: 10/04/2001 Document Revised: 10/31/2011  Document Reviewed: 01/03/2011 [facility] Patient Information 2014 [place], [place]. Sterilization Information, Female Female sterilization is a procedure to permanently prevent pregnancy. There are different ways to perform sterilization, but all either block or close the fallopian tubes so that your eggs cannot reach your uterus. If your egg cannot reach your uterus, sperm cannot fertilize the egg, and you cannot get pregnant.  Sterilization is performed by a surgical procedure. Sometimes these procedures are performed in a hospital while a patient is asleep. Sometimes they can be done in a clinic setting with the patient awake. The fallopian tubes can be surgically cut, tied, or sealed through a procedure called tubal ligation. The fallopian tubes can also be closed with clips or rings. Sterilization can also be done by placing a tiny coil into each fallopian tube, which causes scar tissue to grow inside the tube. The scar tissue then blocks the tubes.  Discuss sterilization with your caregiver to answer any concerns you or your partner may have. You may want to ask what type of sterilization your caregiver performs. Some caregivers may not perform all the various options. Sterilization is permanent and should only be done if you are sure you do not want children or do not want any more children. Having a sterilization reversed may not be successful.  STERILIZATION PROCEDURES  Laparoscopic sterilization. This is a surgical method performed at a time other than right after childbirth. Two incisions are made in the lower abdomen. A thin, lighted tube (laparoscope) is inserted into one of the incisions and is used to perform the procedure. The fallopian tubes are closed with a ring or a clip. An instrument that uses heat could be used to seal the tubes closed (electrocautery).   Mini-laparotomy. This is a surgical method done 1 or 2 days after giving birth. Typically, a small incision is made just below the  belly button (umbilicus) and the fallopian tubes are exposed. The tubes can then be sealed, tied, or cut.   Hysteroscopic sterilization. This is performed at a time other than right after childbirth. A tiny, spring-like coil is inserted through the cervix and uterus and placed into the fallopian tubes. The coil causes scaring and blocks the tubes. Other forms of contraception should be used for 3 months after the procedure to allow the scar tissue to form completely. Additionally, it is required hysterosalpingography be done 3 months later to ensure that the procedure was successful. Hysterosalpingography is a procedure that uses X-rays to look at your uterus and fallopian tubes after a material to make them show up better has been inserted. IS STERILIZATION SAFE? Sterilization is considered safe with very rare complications. Risks depend on the type of procedure you have. As with any surgical procedure, there are risks. Some risks of sterilization by any means include:   Bleeding.  Infection.  Reaction to anesthesia medicine.  Injury to surrounding organs. Risks specific to having hysteroscopic coils placed include:  The coils may not be placed correctly the first time.   The coils may move out of place.   The tubes may not get completely blocked after 3 months.   Injury to surrounding organs when placing the coil.  HOW EFFECTIVE IS FEMALE STERILIZATION? Sterilization is nearly 100% effective, but it can fail. Depending on the type of sterilization, the rate of failure can be as high as 3%. After hysteroscopic sterilization with placement of fallopian tube coils, you will need back-up birth control for 3 months after the procedure. Sterilization is effective for a lifetime.  BENEFITS OF STERILIZATION  It does not affect your hormones, and therefore will not affect your menstrual periods, sexual desire, or performance.   It is effective for a lifetime.   It is safe.   You do  not need to worry about getting pregnant. Keep in mind that if you had the hysteroscopic placement procedure, you must wait 3 months after the procedure (or until your caregiver confirms) before pregnancy is not considered possible.   There are no side effects unlike other types of birth control (contraception).  DRAWBACKS OF STERILIZATION  You must be sure you do not want children or any more children. The procedure is permanent.   It does not provide protection against sexually transmitted infections (STIs).   The tubes can grow back together. If this happens, there is a risk of pregnancy. There is also an increased risk (50%) of pregnancy being an ectopic pregnancy. This is a pregnancy that happens outside of the uterus. Document Released: 01/25/2008 Document Revised: 02/07/2012 Document Reviewed: 11/24/2011 [facility] Patient Information 2014 [place], [place].

## 2013-01-11 LAB — HIV ANTIBODY (ROUTINE TESTING W REFLEX): HIV: NONREACTIVE

## 2013-06-27 ENCOUNTER — Other Ambulatory Visit: Payer: Self-pay

## 2013-07-09 ENCOUNTER — Ambulatory Visit: Payer: 59 | Admitting: Obstetrics & Gynecology

## 2013-07-23 ENCOUNTER — Telehealth: Payer: Self-pay | Admitting: Family Medicine

## 2013-07-23 NOTE — Telephone Encounter (Signed)
Noted. Agree. plz notify - if fever or lower abd pain associated with BM changes, recommend eval in h/o diverticulitis.

## 2013-07-23 NOTE — Telephone Encounter (Signed)
Patient Information:  Caller Name: Kinslee  Phone: [phone]  Patient: [name], [name]  Gender: Female  DOB: [birth year]  Age: 37 Years  PCP: Eustaquio Boyden [facility])  Pregnant: No  Office Follow Up:  Does the office need to follow up with this patient?: No  Instructions For The Office: N/A  RN Note:  Afebrile. Onset 07/19/2013 gurgling stomach and acid reflux. Stool is softer than usual, having 2-3 bm/day (which is normal for self).  Symptoms  Reason For Call & Symptoms: acid reflux and gurgling stomach and nauseated.  Reviewed Health History In EMR: Yes  Reviewed Medications In EMR: Yes  Reviewed Allergies In EMR: Yes  Reviewed Surgeries / Procedures: Yes  Date of Onset of Symptoms: 07/19/2013 OB / GYN:  LMP: 07/09/2013  Guideline(s) Used:  Diarrhea  Disposition Per Guideline:   Home Care  Reason For Disposition Reached:   Mild diarrhea  Advice Given:  Fluids:  Drink more fluids, at least 8-10 glasses (8 oz or 240 ml) daily.  Supplement this with saltine crackers or soups to make certain that you are getting sufficient fluid and salt to meet your body's needs.  Avoid caffeinated beverages (Reason: caffeine is mildly dehydrating).  Nutrition:  Ideal initial foods include boiled starches/cereals (e.g., potatoes, rice, noodles, wheat, oats) with a small amount of salt to taste.  Other acceptable foods include: bananas, yogurt, crackers, soup.  As your stools return to normal consistency, resume a normal diet.  Call Back If:  Signs of dehydration occur (e.g., no urine for more than 12 hours, very dry mouth, lightheaded, etc.)  Diarrhea lasts over 7 days  You become worse.  Patient Will Follow Care Advice:  YES

## 2013-07-23 NOTE — Telephone Encounter (Signed)
Patient notified

## 2013-09-05 ENCOUNTER — Telehealth: Payer: Self-pay

## 2013-09-05 NOTE — Telephone Encounter (Signed)
Patent has yeast infection itching and thick white discharge, called in Dyflucan 150 mg to her [facility]. #2 with no refills.

## 2013-12-13 ENCOUNTER — Ambulatory Visit (INDEPENDENT_AMBULATORY_CARE_PROVIDER_SITE_OTHER): Payer: 59 | Admitting: Obstetrics & Gynecology

## 2013-12-13 ENCOUNTER — Encounter: Payer: Self-pay | Admitting: Obstetrics & Gynecology

## 2013-12-13 VITALS — BP 126/89 | HR 74 | Ht 65.0 in | Wt 159.0 lb

## 2013-12-13 DIAGNOSIS — N921 Excessive and frequent menstruation with irregular cycle: Secondary | ICD-10-CM

## 2013-12-13 DIAGNOSIS — N923 Ovulation bleeding: Secondary | ICD-10-CM

## 2013-12-13 NOTE — Progress Notes (Signed)
   Subjective:    Patient ID: [name], female    DOB: 12/12/1975, 38 y.o.   MRN: [id number]  HPI  38 yo D AA [id number](38yo) here today to discuss irregular bleeding between period just this last time. Previously all normal. Abstinent for 6 months. She denies any medication changes. She has some night sweats for about a year, just before she starts her period.  Review of Systems     Objective:   Physical Exam  Normal cervix, NSSA (globular), mobile, NT, normal adnexal exam      Assessment & Plan:  One occasion of IMB Check TSH Reassurance given RTC for already scheduled annual visit next month

## 2013-12-14 LAB — TSH: TSH: 1.505 u[IU]/mL (ref 0.350–4.500)

## 2014-01-10 ENCOUNTER — Ambulatory Visit: Payer: 59 | Admitting: Family Medicine

## 2014-01-15 ENCOUNTER — Ambulatory Visit (INDEPENDENT_AMBULATORY_CARE_PROVIDER_SITE_OTHER): Payer: 59 | Admitting: Obstetrics & Gynecology

## 2014-01-15 ENCOUNTER — Encounter: Payer: Self-pay | Admitting: Obstetrics & Gynecology

## 2014-01-15 VITALS — BP 130/90 | HR 82 | Ht 65.0 in | Wt 157.4 lb

## 2014-01-15 DIAGNOSIS — N760 Acute vaginitis: Secondary | ICD-10-CM

## 2014-01-15 DIAGNOSIS — Z1151 Encounter for screening for human papillomavirus (HPV): Secondary | ICD-10-CM

## 2014-01-15 DIAGNOSIS — Z0000 Encounter for general adult medical examination without abnormal findings: Secondary | ICD-10-CM

## 2014-01-15 DIAGNOSIS — Z124 Encounter for screening for malignant neoplasm of cervix: Secondary | ICD-10-CM

## 2014-01-15 DIAGNOSIS — Z87410 Personal history of cervical dysplasia: Secondary | ICD-10-CM

## 2014-01-15 DIAGNOSIS — Z113 Encounter for screening for infections with a predominantly sexual mode of transmission: Secondary | ICD-10-CM

## 2014-01-15 DIAGNOSIS — Z01419 Encounter for gynecological examination (general) (routine) without abnormal findings: Secondary | ICD-10-CM

## 2014-01-15 NOTE — Patient Instructions (Signed)
Preventive Care for Adults, Female A healthy lifestyle and preventive care can promote health and wellness. Preventive health guidelines for women include the following key practices.  A routine yearly physical is a good way to check with your health care provider about your health and preventive screening. It is a chance to share any concerns and updates on your health and to receive a thorough exam.  Visit your dentist for a routine exam and preventive care every 6 months. Brush your teeth twice a day and floss once a day. Good oral hygiene prevents tooth decay and gum disease.  The frequency of eye exams is based on your age, health, family medical history, use of contact lenses, and other factors. Follow your health care provider's recommendations for frequency of eye exams.  Eat a healthy diet. Foods like vegetables, fruits, whole grains, low-fat dairy products, and lean protein foods contain the nutrients you need without too many calories. Decrease your intake of foods high in solid fats, added sugars, and salt. Eat the right amount of calories for you.Get information about a proper diet from your health care provider, if necessary.  Regular physical exercise is one of the most important things you can do for your health. Most adults should get at least 150 minutes of moderate-intensity exercise (any activity that increases your heart rate and causes you to sweat) each week. In addition, most adults need muscle-strengthening exercises on 2 or more days a week.  Maintain a healthy weight. The body mass index (BMI) is a screening tool to identify possible weight problems. It provides an estimate of body fat based on height and weight. Your health care provider can find your BMI, and can help you achieve or maintain a healthy weight.For adults 20 years and older:  A BMI below 18.5 is considered underweight.  A BMI of 18.5 to 24.9 is normal.  A BMI of 25 to 29.9 is considered  overweight.  A BMI of 30 and above is considered obese.  Maintain normal blood lipids and cholesterol levels by exercising and minimizing your intake of saturated fat. Eat a balanced diet with plenty of fruit and vegetables. Blood tests for lipids and cholesterol should begin at age 20 and be repeated every 5 years. If your lipid or cholesterol levels are high, you are over 50, or you are at high risk for heart disease, you may need your cholesterol levels checked more frequently.Ongoing high lipid and cholesterol levels should be treated with medicines if diet and exercise are not working.  If you smoke, find out from your health care provider how to quit. If you do not use tobacco, do not start.  Lung cancer screening is recommended for adults aged 55 80 years who are at high risk for developing lung cancer because of a history of smoking. A yearly low-dose CT scan of the lungs is recommended for people who have at least a 30-pack-year history of smoking and are a current smoker or have quit within the past 15 years. A pack year of smoking is smoking an average of 1 pack of cigarettes a day for 1 year (for example: 1 pack a day for 30 years or 2 packs a day for 15 years). Yearly screening should continue until the smoker has stopped smoking for at least 15 years. Yearly screening should be stopped for people who develop a health problem that would prevent them from having lung cancer treatment.  If you are pregnant, do not drink alcohol. If you   are breastfeeding, be very cautious about drinking alcohol. If you are not pregnant and choose to drink alcohol, do not have more than 1 drink per day. One drink is considered to be 12 ounces (355 mL) of beer, 5 ounces (148 mL) of wine, or 1.5 ounces (44 mL) of liquor.  Avoid use of street drugs. Do not share needles with anyone. Ask for help if you need support or instructions about stopping the use of drugs.  High blood pressure causes heart disease and  increases the risk of stroke. Your blood pressure should be checked at least every 1 to 2 years. Ongoing high blood pressure should be treated with medicines if weight loss and exercise do not work.  If you are 20 36 years old, ask your health care provider if you should take aspirin to prevent strokes.  Diabetes screening involves taking a blood sample to check your fasting blood sugar level. This should be done once every 3 years, after age 35, if you are within normal weight and without risk factors for diabetes. Testing should be considered at a younger age or be carried out more frequently if you are overweight and have at least 1 risk factor for diabetes.  Breast cancer screening is essential preventive care for women. You should practice "breast self-awareness." This means understanding the normal appearance and feel of your breasts and may include breast self-examination. Any changes detected, no matter how small, should be reported to a health care provider. Women in their 42s and 30s should have a clinical breast exam (CBE) by a health care provider as part of a regular health exam every 1 to 3 years. After age 74, women should have a CBE every year. Starting at age 43, women should consider having a mammogram (breast X-ray test) every year. Women who have a family history of breast cancer should talk to their health care provider about genetic screening. Women at a high risk of breast cancer should talk to their health care providers about having an MRI and a mammogram every year.  Breast cancer gene (BRCA)-related cancer risk assessment is recommended for women who have family members with BRCA-related cancers. BRCA-related cancers include breast, ovarian, tubal, and peritoneal cancers. Having family members with these cancers may be associated with an increased risk for harmful changes (mutations) in the breast cancer genes BRCA1 and BRCA2. Results of the assessment will determine the need for  genetic counseling and BRCA1 and BRCA2 testing.  The Pap test is a screening test for cervical cancer. A Pap test can show cell changes on the cervix that might become cervical cancer if left untreated. A Pap test is a procedure in which cells are obtained and examined from the lower end of the uterus (cervix).  Women should have a Pap test starting at age 60.  Between ages 63 and 62, Pap tests should be repeated every 2 years.  Beginning at age 43, you should have a Pap test every 3 years as long as the past 3 Pap tests have been normal.  Some women have medical problems that increase the chance of getting cervical cancer. Talk to your health care provider about these problems. It is especially important to talk to your health care provider if a new problem develops soon after your last Pap test. In these cases, your health care provider may recommend more frequent screening and Pap tests.  The above recommendations are the same for women who have or have not gotten the vaccine  for human papillomavirus (HPV).  If you had a hysterectomy for a problem that was not cancer or a condition that could lead to cancer, then you no longer need Pap tests. Even if you no longer need a Pap test, a regular exam is a good idea to make sure no other problems are starting.  If you are between ages 65 and 70 years, and you have had normal Pap tests going back 10 years, you no longer need Pap tests. Even if you no longer need a Pap test, a regular exam is a good idea to make sure no other problems are starting.  If you have had past treatment for cervical cancer or a condition that could lead to cancer, you need Pap tests and screening for cancer for at least 20 years after your treatment.  If Pap tests have been discontinued, risk factors (such as a new sexual partner) need to be reassessed to determine if screening should be resumed.  The HPV test is an additional test that may be used for cervical cancer  screening. The HPV test looks for the virus that can cause the cell changes on the cervix. The cells collected during the Pap test can be tested for HPV. The HPV test could be used to screen women aged 30 years and older, and should be used in women of any age who have unclear Pap test results. After the age of 30, women should have HPV testing at the same frequency as a Pap test.  Colorectal cancer can be detected and often prevented. Most routine colorectal cancer screening begins at the age of 50 years and continues through age 75 years. However, your health care provider may recommend screening at an earlier age if you have risk factors for colon cancer. On a yearly basis, your health care provider may provide home test kits to check for hidden blood in the stool. Use of a small camera at the end of a tube, to directly examine the colon (sigmoidoscopy or colonoscopy), can detect the earliest forms of colorectal cancer. Talk to your health care provider about this at age 50, when routine screening begins. Direct exam of the colon should be repeated every 5 10 years through age 75 years, unless early forms of pre-cancerous polyps or small growths are found.  People who are at an increased risk for hepatitis B should be screened for this virus. You are considered at high risk for hepatitis B if:  You were born in a country where hepatitis B occurs often. Talk with your health care provider about which countries are considered high risk.  Your parents were born in a high-risk country and you have not received a shot to protect against hepatitis B (hepatitis B vaccine).  You have HIV or AIDS.  You use needles to inject street drugs.  You live with, or have sex with, someone who has Hepatitis B.  You get hemodialysis treatment.  You take certain medicines for conditions like cancer, organ transplantation, and autoimmune conditions.  Hepatitis C blood testing is recommended for all people born from  1945 through 1965 and any individual with known risks for hepatitis C.  Practice safe sex. Use condoms and avoid high-risk sexual practices to reduce the spread of sexually transmitted infections (STIs). STIs include gonorrhea, chlamydia, syphilis, trichomonas, herpes, HPV, and human immunodeficiency virus (HIV). Herpes, HIV, and HPV are viral illnesses that have no cure. They can result in disability, cancer, and death. Sexually active women aged 25   years and younger should be checked for chlamydia. Older women with new or multiple partners should also be tested for chlamydia. Testing for other STIs is recommended if you are sexually active and at increased risk.  Osteoporosis is a disease in which the bones lose minerals and strength with aging. This can result in serious bone fractures or breaks. The risk of osteoporosis can be identified using a bone density scan. Women ages 65 years and over and women at risk for fractures or osteoporosis should discuss screening with their health care providers. Ask your health care provider whether you should take a calcium supplement or vitamin D to reduce the rate of osteoporosis.  Menopause can be associated with physical symptoms and risks. Hormone replacement therapy is available to decrease symptoms and risks. You should talk to your health care provider about whether hormone replacement therapy is right for you.  Use sunscreen. Apply sunscreen liberally and repeatedly throughout the day. You should seek shade when your shadow is shorter than you. Protect yourself by wearing long sleeves, pants, a wide-brimmed hat, and sunglasses year round, whenever you are outdoors.  Once a month, do a whole body skin exam, using a mirror to look at the skin on your back. Tell your health care provider of new moles, moles that have irregular borders, moles that are larger than a pencil eraser, or moles that have changed in shape or color.  Stay current with required  vaccines (immunizations).  Influenza vaccine. All adults should be immunized every year.  Tetanus, diphtheria, and acellular pertussis (Td, Tdap) vaccine. Pregnant women should receive 1 dose of Tdap vaccine during each pregnancy. The dose should be obtained regardless of the length of time since the last dose. Immunization is preferred during the 27th 36th week of gestation. An adult who has not previously received Tdap or who does not know her vaccine status should receive 1 dose of Tdap. This initial dose should be followed by tetanus and diphtheria toxoids (Td) booster doses every 10 years. Adults with an unknown or incomplete history of completing a 3-dose immunization series with Td-containing vaccines should begin or complete a primary immunization series including a Tdap dose. Adults should receive a Td booster every 10 years.  Varicella vaccine. An adult without evidence of immunity to varicella should receive 2 doses or a second dose if she has previously received 1 dose. Pregnant females who do not have evidence of immunity should receive the first dose after pregnancy. This first dose should be obtained before leaving the health care facility. The second dose should be obtained 4 8 weeks after the first dose.  Human papillomavirus (HPV) vaccine. Females aged 13 26 years who have not received the vaccine previously should obtain the 3-dose series. The vaccine is not recommended for use in pregnant females. However, pregnancy testing is not needed before receiving a dose. If a female is found to be pregnant after receiving a dose, no treatment is needed. In that case, the remaining doses should be delayed until after the pregnancy. Immunization is recommended for any person with an immunocompromised condition through the age of 26 years if she did not get any or all doses earlier. During the 3-dose series, the second dose should be obtained 4 8 weeks after the first dose. The third dose should be  obtained 24 weeks after the first dose and 16 weeks after the second dose.  Zoster vaccine. One dose is recommended for adults aged 60 years or older unless certain   conditions are present.  Measles, mumps, and rubella (MMR) vaccine. Adults born before 1957 generally are considered immune to measles and mumps. Adults born in 1957 or later should have 1 or more doses of MMR vaccine unless there is a contraindication to the vaccine or there is laboratory evidence of immunity to each of the three diseases. A routine second dose of MMR vaccine should be obtained at least 28 days after the first dose for students attending postsecondary schools, health care workers, or international travelers. People who received inactivated measles vaccine or an unknown type of measles vaccine during 1963 1967 should receive 2 doses of MMR vaccine. People who received inactivated mumps vaccine or an unknown type of mumps vaccine before 1979 and are at high risk for mumps infection should consider immunization with 2 doses of MMR vaccine. For females of childbearing age, rubella immunity should be determined. If there is no evidence of immunity, females who are not pregnant should be vaccinated. If there is no evidence of immunity, females who are pregnant should delay immunization until after pregnancy. Unvaccinated health care workers born before 1957 who lack laboratory evidence of measles, mumps, or rubella immunity or laboratory confirmation of disease should consider measles and mumps immunization with 2 doses of MMR vaccine or rubella immunization with 1 dose of MMR vaccine.  Pneumococcal 13-valent conjugate (PCV13) vaccine. When indicated, a person who is uncertain of her immunization history and has no record of immunization should receive the PCV13 vaccine. An adult aged 19 years or older who has certain medical conditions and has not been previously immunized should receive 1 dose of PCV13 vaccine. This PCV13 should be  followed with a dose of pneumococcal polysaccharide (PPSV23) vaccine. The PPSV23 vaccine dose should be obtained at least 8 weeks after the dose of PCV13 vaccine. An adult aged 19 years or older who has certain medical conditions and previously received 1 or more doses of PPSV23 vaccine should receive 1 dose of PCV13. The PCV13 vaccine dose should be obtained 1 or more years after the last PPSV23 vaccine dose.  Pneumococcal polysaccharide (PPSV23) vaccine. When PCV13 is also indicated, PCV13 should be obtained first. All adults aged 65 years and older should be immunized. An adult younger than age 65 years who has certain medical conditions should be immunized. Any person who resides in a nursing home or long-term care facility should be immunized. An adult smoker should be immunized. People with an immunocompromised condition and certain other conditions should receive both PCV13 and PPSV23 vaccines. People with human immunodeficiency virus (HIV) infection should be immunized as soon as possible after diagnosis. Immunization during chemotherapy or radiation therapy should be avoided. Routine use of PPSV23 vaccine is not recommended for American Indians, [place] Natives, or people younger than 65 years unless there are medical conditions that require PPSV23 vaccine. When indicated, people who have unknown immunization and have no record of immunization should receive PPSV23 vaccine. One-time revaccination 5 years after the first dose of PPSV23 is recommended for people aged 19 64 years who have chronic kidney failure, nephrotic syndrome, asplenia, or immunocompromised conditions. People who received 1 2 doses of PPSV23 before age 65 years should receive another dose of PPSV23 vaccine at age 65 years or later if at least 5 years have passed since the previous dose. Doses of PPSV23 are not needed for people immunized with PPSV23 at or after age 65 years.  Meningococcal vaccine. Adults with asplenia or persistent  complement component deficiencies should receive 2   doses of quadrivalent meningococcal conjugate (MenACWY-D) vaccine. The doses should be obtained at least 2 months apart. Microbiologists working with certain meningococcal bacteria, military recruits, people at risk during an outbreak, and people who travel to or live in countries with a high rate of meningitis should be immunized. A first-year college student up through age 21 years who is living in a residence hall should receive a dose if she did not receive a dose on or after her 16th birthday. Adults who have certain high-risk conditions should receive one or more doses of vaccine.  Hepatitis A vaccine. Adults who wish to be protected from this disease, have certain high-risk conditions, work with hepatitis A-infected animals, work in hepatitis A research labs, or travel to or work in countries with a high rate of hepatitis A should be immunized. Adults who were previously unvaccinated and who anticipate close contact with an international adoptee during the first 60 days after arrival in the [place] from a country with a high rate of hepatitis A should be immunized.  Hepatitis B vaccine. Adults who wish to be protected from this disease, have certain high-risk conditions, may be exposed to blood or other infectious body fluids, are household contacts or sex partners of hepatitis B positive people, are clients or workers in certain care facilities, or travel to or work in countries with a high rate of hepatitis B should be immunized.  Haemophilus influenzae type b (Hib) vaccine. A previously unvaccinated person with asplenia or sickle cell disease or having a scheduled splenectomy should receive 1 dose of Hib vaccine. Regardless of previous immunization, a recipient of a hematopoietic stem cell transplant should receive a 3-dose series 6 12 months after her successful transplant. Hib vaccine is not recommended for adults with HIV  infection. Preventive Services / Frequency Ages 19 to 39years  Blood pressure check.** / Every 1 to 2 years.  Lipid and cholesterol check.** / Every 5 years beginning at age 20.  Clinical breast exam.** / Every 3 years for women in their 20s and 30s.  BRCA-related cancer risk assessment.** / For women who have family members with a BRCA-related cancer (breast, ovarian, tubal, or peritoneal cancers).  Pap test.** / Every 2 years from ages 21 through 29. Every 3 years starting at age 30 through age 65 or 70 with a history of 3 consecutive normal Pap tests.  HPV screening.** / Every 3 years from ages 30 through ages 65 to 70 with a history of 3 consecutive normal Pap tests.  Hepatitis C blood test.** / For any individual with known risks for hepatitis C.  Skin self-exam. / Monthly.  Influenza vaccine. / Every year.  Tetanus, diphtheria, and acellular pertussis (Tdap, Td) vaccine.** / Consult your health care provider. Pregnant women should receive 1 dose of Tdap vaccine during each pregnancy. 1 dose of Td every 10 years.  Varicella vaccine.** / Consult your health care provider. Pregnant females who do not have evidence of immunity should receive the first dose after pregnancy.  HPV vaccine. / 3 doses over 6 months, if 26 and younger. The vaccine is not recommended for use in pregnant females. However, pregnancy testing is not needed before receiving a dose.  Measles, mumps, rubella (MMR) vaccine.** / You need at least 1 dose of MMR if you were born in 1957 or later. You may also need a 2nd dose. For females of childbearing age, rubella immunity should be determined. If there is no evidence of immunity, females who are not   pregnant should be vaccinated. If there is no evidence of immunity, females who are pregnant should delay immunization until after pregnancy.  Pneumococcal 13-valent conjugate (PCV13) vaccine.** / Consult your health care provider.  Pneumococcal polysaccharide (PPSV23)  vaccine.** / 1 to 2 doses if you smoke cigarettes or if you have certain conditions.  Meningococcal vaccine.** / 1 dose if you are age 88 to 41 years and a Market researcher living in a residence hall, or have one of several medical conditions, you need to get vaccinated against meningococcal disease. You may also need additional booster doses.  Hepatitis A vaccine.** / Consult your health care provider.  Hepatitis B vaccine.** / Consult your health care provider.  Haemophilus influenzae type b (Hib) vaccine.** / Consult your health care provider. Ages 45 to 64years  Blood pressure check.** / Every 1 to 2 years.  Lipid and cholesterol check.** / Every 5 years beginning at age 2 years.  Lung cancer screening. / Every year if you are aged 10 80 years and have a 30-pack-year history of smoking and currently smoke or have quit within the past 15 years. Yearly screening is stopped once you have quit smoking for at least 15 years or develop a health problem that would prevent you from having lung cancer treatment.  Clinical breast exam.** / Every year after age 28 years.  BRCA-related cancer risk assessment.** / For women who have family members with a BRCA-related cancer (breast, ovarian, tubal, or peritoneal cancers).  Mammogram.** / Every year beginning at age 63 years and continuing for as long as you are in good health. Consult with your health care provider.  Pap test.** / Every 3 years starting at age 71 years through age 39 or 90 years with a history of 3 consecutive normal Pap tests.  HPV screening.** / Every 3 years from ages 27 years through ages 32 to 72 years with a history of 3 consecutive normal Pap tests.  Fecal occult blood test (FOBT) of stool. / Every year beginning at age 40 years and continuing until age 29 years. You may not need to do this test if you get a colonoscopy every 10 years.  Flexible sigmoidoscopy or colonoscopy.** / Every 5 years for a flexible  sigmoidoscopy or every 10 years for a colonoscopy beginning at age 66 years and continuing until age 47 years.  Hepatitis C blood test.** / For all people born from [birth year] through 1963 and any individual with known risks for hepatitis C.  Skin self-exam. / Monthly.  Influenza vaccine. / Every year.  Tetanus, diphtheria, and acellular pertussis (Tdap/Td) vaccine.** / Consult your health care provider. Pregnant women should receive 1 dose of Tdap vaccine during each pregnancy. 1 dose of Td every 10 years.  Varicella vaccine.** / Consult your health care provider. Pregnant females who do not have evidence of immunity should receive the first dose after pregnancy.  Zoster vaccine.** / 1 dose for adults aged 39 years or older.  Measles, mumps, rubella (MMR) vaccine.** / You need at least 1 dose of MMR if you were born in 1957 or later. You may also need a 2nd dose. For females of childbearing age, rubella immunity should be determined. If there is no evidence of immunity, females who are not pregnant should be vaccinated. If there is no evidence of immunity, females who are pregnant should delay immunization until after pregnancy.  Pneumococcal 13-valent conjugate (PCV13) vaccine.** / Consult your health care provider.  Pneumococcal polysaccharide (PPSV23) vaccine.** / 1  to 2 doses if you smoke cigarettes or if you have certain conditions.  Meningococcal vaccine.** / Consult your health care provider.  Hepatitis A vaccine.** / Consult your health care provider.  Hepatitis B vaccine.** / Consult your health care provider.  Haemophilus influenzae type b (Hib) vaccine.** / Consult your health care provider. Ages 65 years and over  Blood pressure check.** / Every 1 to 2 years.  Lipid and cholesterol check.** / Every 5 years beginning at age 20 years.  Lung cancer screening. / Every year if you are aged 55 80 years and have a 30-pack-year history of smoking and currently smoke or have quit within the past 15 years.  Yearly screening is stopped once you have quit smoking for at least 15 years or develop a health problem that would prevent you from having lung cancer treatment.  Clinical breast exam.** / Every year after age 40 years.  BRCA-related cancer risk assessment.** / For women who have family members with a BRCA-related cancer (breast, ovarian, tubal, or peritoneal cancers).  Mammogram.** / Every year beginning at age 40 years and continuing for as long as you are in good health. Consult with your health care provider.  Pap test.** / Every 3 years starting at age 30 years through age 65 or 70 years with 3 consecutive normal Pap tests. Testing can be stopped between 65 and 70 years with 3 consecutive normal Pap tests and no abnormal Pap or HPV tests in the past 10 years.  HPV screening.** / Every 3 years from ages 30 years through ages 65 or 70 years with a history of 3 consecutive normal Pap tests. Testing can be stopped between 65 and 70 years with 3 consecutive normal Pap tests and no abnormal Pap or HPV tests in the past 10 years.  Fecal occult blood test (FOBT) of stool. / Every year beginning at age 50 years and continuing until age 75 years. You may not need to do this test if you get a colonoscopy every 10 years.  Flexible sigmoidoscopy or colonoscopy.** / Every 5 years for a flexible sigmoidoscopy or every 10 years for a colonoscopy beginning at age 50 years and continuing until age 75 years.  Hepatitis C blood test.** / For all people born from 1945 through 1965 and any individual with known risks for hepatitis C.  Osteoporosis screening.** / A one-time screening for women ages 65 years and over and women at risk for fractures or osteoporosis.  Skin self-exam. / Monthly.  Influenza vaccine. / Every year.  Tetanus, diphtheria, and acellular pertussis (Tdap/Td) vaccine.** / 1 dose of Td every 10 years.  Varicella vaccine.** / Consult your health care provider.  Zoster vaccine.** / 1  dose for adults aged 60 years or older.  Pneumococcal 13-valent conjugate (PCV13) vaccine.** / Consult your health care provider.  Pneumococcal polysaccharide (PPSV23) vaccine.** / 1 dose for all adults aged 65 years and older.  Meningococcal vaccine.** / Consult your health care provider.  Hepatitis A vaccine.** / Consult your health care provider.  Hepatitis B vaccine.** / Consult your health care provider.  Haemophilus influenzae type b (Hib) vaccine.** / Consult your health care provider. ** Family history and personal history of risk and conditions may change your health care provider's recommendations. Document Released: 10/04/2001 Document Revised: 05/29/2013 Document Reviewed: 01/03/2011 Exit[employer] Patient Information 2014 [employer].  

## 2014-01-15 NOTE — Progress Notes (Signed)
    GYNECOLOGY CLINIC ANNUAL PREVENTATIVE CARE ENCOUNTER NOTE  Subjective:     [name] is a 38 y.o. G42P1101 female here for a routine annual gynecologic exam.  Current complaints: none.  Desires STI screen and preventative health maintenance labs.  Also wants to be tested for vaginitis; had symptoms last week, none currently.     Gynecologic History Contraception: none Last Pap: 01/10/13. Results were: normal, negative HRHPV.  Had LGSIL paps in 2012 and 2013.   Obstetric History OB History  Gravida Para Term Preterm AB SAB TAB Ectopic Multiple Living  2 2 1 1      1     # Outcome Date GA Lbr Len/2nd Weight Sex Delivery Anes PTL Lv  2 TRM 11/05/04 [redacted]w[redacted]d  6 lb 12 oz (3.062 kg) F CS   Y  1 PRE 06/08/00 [redacted]w[redacted]d  14.9 oz (0.423 kg) F CCS Spinal  N     The following portions of the patient's history were reviewed and updated as appropriate: allergies, current medications, past family history, past medical history, past social history, past surgical history and problem list.  Review of Systems A comprehensive review of systems was negative.    Objective:   BP 130/90  Pulse 82  Ht 5\' 5"  (1.651 m)  Wt 157 lb 6.4 oz (71.396 kg)  BMI 26.19 kg/m2 GENERAL: Well-developed, well-nourished female in no acute distress.  HEENT: Normocephalic, atraumatic. Sclerae anicteric.  NECK: Supple. Normal thyroid.  LUNGS: Clear to auscultation bilaterally.  HEART: Regular rate and rhythm. BREASTS: Symmetric in size. No masses, skin changes, nipple drainage, or lymphadenopathy. ABDOMEN: Soft, nontender, nondistended. No organomegaly. PELVIC: Normal external female genitalia. Vagina is pink and rugated.  Normal discharge. Normal cervix contour. Pap smear obtained. Uterus is normal in size. No adnexal mass or tenderness.  EXTREMITIES: No cyanosis, clubbing, or edema, 2+ distal pulses.   Assessment:   Annual gynecologic examination   Plan:   Pap, ancillary testing and serum labs done, will  follow up results and manage accordingly. Routine preventative health maintenance measures emphasized   Jaynie Collins, MD, FACOG Attending Obstetrician & Gynecologist Faculty Practice, [facility] [place]

## 2014-01-16 LAB — CBC
HCT: 42.1 % (ref 36.0–46.0)
HEMOGLOBIN: 14.2 g/dL (ref 12.0–15.0)
MCH: 30.4 pg (ref 26.0–34.0)
MCHC: 33.7 g/dL (ref 30.0–36.0)
MCV: 90.1 fL (ref 78.0–100.0)
Platelets: 256 10*3/uL (ref 150–400)
RBC: 4.67 MIL/uL (ref 3.87–5.11)
RDW: 13.3 % (ref 11.5–15.5)
WBC: 3.9 10*3/uL — AB (ref 4.0–10.5)

## 2014-01-16 LAB — COMPREHENSIVE METABOLIC PANEL
ALBUMIN: 4.6 g/dL (ref 3.5–5.2)
ALK PHOS: 50 U/L (ref 39–117)
ALT: 18 U/L (ref 0–35)
AST: 23 U/L (ref 0–37)
BUN: 12 mg/dL (ref 6–23)
CHLORIDE: 101 meq/L (ref 96–112)
CO2: 26 mEq/L (ref 19–32)
Calcium: 9.2 mg/dL (ref 8.4–10.5)
Creat: 0.8 mg/dL (ref 0.50–1.10)
GLUCOSE: 80 mg/dL (ref 70–99)
POTASSIUM: 4.6 meq/L (ref 3.5–5.3)
SODIUM: 137 meq/L (ref 135–145)
TOTAL PROTEIN: 7.7 g/dL (ref 6.0–8.3)
Total Bilirubin: 1.5 mg/dL — ABNORMAL HIGH (ref 0.2–1.2)

## 2014-01-16 LAB — LIPID PANEL
CHOLESTEROL: 169 mg/dL (ref 0–200)
HDL: 74 mg/dL (ref >39)
LDL Cholesterol: 86 mg/dL (ref 0–99)
Total CHOL/HDL Ratio: 2.3 Ratio
Triglycerides: 45 mg/dL (ref <150)
VLDL: 9 mg/dL (ref 0–40)

## 2014-01-16 LAB — RPR

## 2014-01-16 LAB — HIV ANTIBODY (ROUTINE TESTING W REFLEX): HIV 1&2 Ab, 4th Generation: NONREACTIVE

## 2014-01-16 LAB — HEPATITIS C ANTIBODY: HCV AB: NEGATIVE

## 2014-01-30 ENCOUNTER — Telehealth: Payer: Self-pay | Admitting: *Deleted

## 2014-01-30 DIAGNOSIS — B379 Candidiasis, unspecified: Secondary | ICD-10-CM

## 2014-01-30 MED ORDER — FLUCONAZOLE 150 MG PO TABS: 150.0000 mg | ORAL_TABLET | Freq: Once | ORAL | Status: DC

## 2014-01-30 NOTE — Telephone Encounter (Signed)
Patient is having a yeast infection and would like [place] to call in Diflucan for her.

## 2014-02-07 ENCOUNTER — Telehealth: Payer: Self-pay | Admitting: *Deleted

## 2014-02-07 DIAGNOSIS — B379 Candidiasis, unspecified: Secondary | ICD-10-CM

## 2014-02-07 MED ORDER — FLUCONAZOLE 150 MG PO TABS: 150.0000 mg | ORAL_TABLET | Freq: Once | ORAL | Status: DC

## 2014-02-07 NOTE — Telephone Encounter (Signed)
Patient would like to have a refill of Diflucan as one tablet did not take care of her yeast infection.

## 2014-05-13 ENCOUNTER — Telehealth: Payer: Self-pay

## 2014-05-13 NOTE — Telephone Encounter (Signed)
CALLED IN [facility] FOR PATIENT. Pharmacy did not carry 150 mg so I called in 3 pills of . Patient will take one and a half and repeat in a couple of days of symptoms persist.

## 2014-05-28 ENCOUNTER — Other Ambulatory Visit: Payer: Self-pay | Admitting: Family Medicine

## 2014-05-29 ENCOUNTER — Telehealth: Payer: Self-pay | Admitting: Family Medicine

## 2014-05-29 ENCOUNTER — Ambulatory Visit (INDEPENDENT_AMBULATORY_CARE_PROVIDER_SITE_OTHER): Payer: 59 | Admitting: Internal Medicine

## 2014-05-29 ENCOUNTER — Encounter: Payer: Self-pay | Admitting: Internal Medicine

## 2014-05-29 VITALS — BP 118/72 | HR 77 | Temp 98.3°F | Wt 169.0 lb

## 2014-05-29 DIAGNOSIS — R1012 Left upper quadrant pain: Secondary | ICD-10-CM

## 2014-05-29 DIAGNOSIS — R1032 Left lower quadrant pain: Secondary | ICD-10-CM

## 2014-05-29 LAB — POCT URINALYSIS DIPSTICK
BILIRUBIN UA: NEGATIVE
Glucose, UA: NEGATIVE
KETONES UA: NEGATIVE
Leukocytes, UA: NEGATIVE
Nitrite, UA: NEGATIVE
PH UA: 7
Protein, UA: NEGATIVE
RBC UA: NEGATIVE
Spec Grav, UA: 1.01
Urobilinogen, UA: NEGATIVE

## 2014-05-29 NOTE — Telephone Encounter (Signed)
Patient Information:  Caller Name: Lupita LeashDonna  Phone: [phone]  Patient: [name], [name]  Gender: Female  DOB: 06/20/1976  Age: 38 Years  PCP: Eustaquio BoydenGutierrez, Javier [facility](Family Practice)  Pregnant: No  Office Follow Up:  Does the office need to follow up with this patient?: No  Instructions For The Office: N/A  RN Note:  Appt scheduled today 05/29/14 at 16;00 pm with Nicki Reaperegina Baity NP. No appt available with Dr. Reece AgarG.  Care advice provided. Understanding expresssed.  Symptoms  Reason For Call & Symptoms: Patient states onset of Left side abdominal pain for 3 weeks off/on.  Intermittent. Described as dull.   No n/v/d. afebrile. No pain with urination. No vaginal discharge. Eating and drinking normally.  Hx of diverticulitis.  Reviewed Health History In EMR: Yes  Reviewed Medications In EMR: Yes  Reviewed Allergies In EMR: Yes  Reviewed Surgeries / Procedures: Yes  Date of Onset of Symptoms: 05/08/2014 OB / GYN:  LMP: 05/20/2014  Guideline(s) Used:  Abdominal Pain - Female  Disposition Per Guideline:   See Today in Office  Reason For Disposition Reached:   Moderate or mild pain that comes and goes (cramps) lasts > 24 hours  Advice Given:  Fluids:  Sip clear fluids only (e.g., water, flat soft drinks or 1/2 strength fruit juice) until the pain has been gone for over 2 hours. Then slowly return to a regular diet.  Diet:  Slowly advance diet from clear liquids to a bland diet  Avoid alcohol or caffeinated beverages  Avoid greasy or fatty foods.  Avoid NSAIDS and Aspirin  : Avoid any drug that can irritate the stomach lining and make the pain worse (especially aspirin and NSAIDs like ibuprofen).  Call Back If:  Abdominal pain is constant and present for more than 2 hours  You become worse.  Patient Will Follow Care Advice:  YES  Appointment Scheduled:  05/29/2014 16:00:00 Appointment Scheduled Provider:  Nicki ReaperBaity, Regina

## 2014-05-29 NOTE — Patient Instructions (Addendum)

## 2014-05-29 NOTE — Progress Notes (Signed)
Pre visit review using our clinic review tool, if applicable. No additional management support is needed unless otherwise documented below in the visit note. 

## 2014-05-29 NOTE — Progress Notes (Signed)
Subjective:    Patient ID: [name], female    DOB: [birth year], 38 y.o.   MRN: [id number]  HPI  Pt presents to the clinic today with c/o left sided abdominal pain. She reports this started 3 weeks ago. It is intermittent. She describes the pain as achy.It does not seem to be related to food. She has not had any nausea, vomiting or diarrhea. She denies vaginal or urinary complaints. She does have a history of diverticulosis and thinks that this is what is causing her pain. She did cal her gyn and is going to be seen next Thursday. They will be doing a pelvic ultrasound at that visit. She has not tried anything OTC. Her LMP was 1 week ago.  Review of Systems      Past Medical History  Diagnosis Date  . Protein C deficiency   . History of asthma     childhood  . Abnormal Pap smear     cryo age 47;colposcopy 2013  . History of diverticulitis of colon 11/2012    Current Outpatient Prescriptions  Medication Sig Dispense Refill  . cetirizine (ZYRTEC) 10 MG tablet Take 10 mg by mouth daily.        . fluconazole (DIFLUCAN) 150 MG tablet Take 1 tablet (150 mg total) by mouth once.  2 tablet  0  . ibuprofen (ADVIL,MOTRIN) 400 MG tablet Take 400 mg by mouth every 6 (six) hours as needed for pain.       . Multiple Vitamin (MULITIVITAMIN WITH MINERALS) TABS Take 1 tablet by mouth daily.       No current facility-administered medications for this visit.    No Known Allergies  Family History  Problem Relation Age of Onset  . Cancer Maternal Aunt 50    breast/colon  . Colon cancer Maternal Aunt   . Hypertension Mother   . Hypertension Father   . Diabetes Father   . Stroke Paternal Grandmother   . Prostate cancer Paternal Grandfather   . Pulmonary embolism Cousin 28    pulm embolism    History   Social History  . Marital Status: Divorced    Spouse Name: N/A    Number of Children: 1  . Years of Education: N/A   Occupational History  . customer service [employer]   Social  History Main Topics  . Smoking status: Never Smoker   . Smokeless tobacco: Never Used  . Alcohol Use: Yes     Comment: social  . Drug Use: No  . Sexual Activity: Not Currently   Other Topics Concern  . Not on file   Social History Narrative   Lives with 1 daughter (2006)   Occupation: customer service   Edu: some college   Activity: walks some   Diet: good water, fruits/vegetables daily     Constitutional: Denies fever, malaise, fatigue, headache or abrupt weight changes.  Respiratory: Denies difficulty breathing, shortness of breath, cough or sputum production.   Cardiovascular: Denies chest pain, chest tightness, palpitations or swelling in the hands or feet.  Gastrointestinal: Pt reports abdominal pain. Denies  bloating, constipation, diarrhea or blood in the stool.  GU: Denies urgency, frequency, pain with urination, burning sensation, blood in urine, odor or discharge.   No other specific complaints in a complete review of systems (except as listed in HPI above).  Objective:   Physical Exam   BP 118/72  Pulse 77  Temp(Src) 98.3 F (36.8 C) (Oral)  Wt 169 lb (76.658  kg)  SpO2 98%  LMP 05/20/2014 Wt Readings from Last 3 Encounters:  05/29/14 169 lb (76.658 kg)  01/15/14 157 lb 6.4 oz (71.396 kg)  12/13/13 159 lb (72.122 kg)    General: Appears her stated age, well developed, well nourished in NAD. Cardiovascular: Normal rate and rhythm. S1,S2 noted.  No murmur, rubs or gallops noted.  Pulmonary/Chest: Normal effort and positive vesicular breath sounds. No respiratory distress. No wheezes, rales or ronchi noted.  Abdomen: Soft and slightly tender in the LLQ/LUQ. Normal bowel sounds, no bruits noted. No distention or masses noted. Liver, spleen and kidneys non palpable.   BMET    Component Value Date/Time   NA 137 01/15/2014 0944   K 4.6 01/15/2014 0944   CL 101 01/15/2014 0944   CO2 26 01/15/2014 0944   GLUCOSE 80 01/15/2014 0944   BUN 12 01/15/2014 0944    CREATININE 0.80 01/15/2014 0944   CREATININE 0.80 11/24/2012 1235   CALCIUM 9.2 01/15/2014 0944   GFRNONAA >90 11/24/2012 1235   GFRAA >90 11/24/2012 1235    Lipid Panel     Component Value Date/Time   CHOL 169 01/15/2014 0944   TRIG 45 01/15/2014 0944   HDL 74 01/15/2014 0944   CHOLHDL 2.3 01/15/2014 0944   VLDL 9 01/15/2014 0944   LDLCALC 86 01/15/2014 0944    CBC    Component Value Date/Time   WBC 3.9* 01/15/2014 0944   RBC 4.67 01/15/2014 0944   HGB 14.2 01/15/2014 0944   HCT 42.1 01/15/2014 0944   PLT 256 01/15/2014 0944   MCV 90.1 01/15/2014 0944   MCH 30.4 01/15/2014 0944   MCHC 33.7 01/15/2014 0944   RDW 13.3 01/15/2014 0944   LYMPHSABS 0.8 11/24/2012 1235   MONOABS 1.6* 11/24/2012 1235   EOSABS 0.0 11/24/2012 1235   BASOSABS 0.0 11/24/2012 1235    Hgb A1C No results found for this basename: HGBA1C        Assessment & Plan:   LLQ/LUQ pain:  ? Diverticulitis. Not as tender as I would suspect, no diarrhea Will check CBC, CMET and Lipase- if WBC elevated will start cipro/flagyl Urinalysis: normal If labs normal, consider CT scan abdomen  Will follow up with you after labs  RTC as needed or if symptoms persist or worsen

## 2014-05-30 ENCOUNTER — Telehealth: Payer: Self-pay | Admitting: Internal Medicine

## 2014-05-30 ENCOUNTER — Other Ambulatory Visit: Payer: Self-pay | Admitting: Internal Medicine

## 2014-05-30 DIAGNOSIS — R1032 Left lower quadrant pain: Secondary | ICD-10-CM

## 2014-05-30 DIAGNOSIS — R1012 Left upper quadrant pain: Secondary | ICD-10-CM

## 2014-05-30 LAB — CBC
HCT: 38.3 % (ref 36.0–46.0)
Hemoglobin: 12.4 g/dL (ref 12.0–15.0)
MCHC: 32.6 g/dL (ref 30.0–36.0)
MCV: 92.5 fl (ref 78.0–100.0)
Platelets: 255 10*3/uL (ref 150.0–400.0)
RBC: 4.13 Mil/uL (ref 3.87–5.11)
RDW: 12.6 % (ref 11.5–15.5)
WBC: 6.4 10*3/uL (ref 4.0–10.5)

## 2014-05-30 LAB — COMPREHENSIVE METABOLIC PANEL
ALBUMIN: 3.4 g/dL — AB (ref 3.5–5.2)
ALK PHOS: 48 U/L (ref 39–117)
ALT: 22 U/L (ref 0–35)
AST: 27 U/L (ref 0–37)
BUN: 15 mg/dL (ref 6–23)
CO2: 24 mEq/L (ref 19–32)
Calcium: 8.6 mg/dL (ref 8.4–10.5)
Chloride: 103 mEq/L (ref 96–112)
Creatinine, Ser: 1.1 mg/dL (ref 0.4–1.2)
GFR: 73.82 mL/min (ref >60.00)
Glucose, Bld: 89 mg/dL (ref 70–99)
POTASSIUM: 4.2 meq/L (ref 3.5–5.1)
SODIUM: 133 meq/L — AB (ref 135–145)
TOTAL PROTEIN: 7.4 g/dL (ref 6.0–8.3)
Total Bilirubin: 0.8 mg/dL (ref 0.2–1.2)

## 2014-05-30 LAB — LIPASE: Lipase: 47 U/L (ref 11.0–59.0)

## 2014-05-30 NOTE — Telephone Encounter (Signed)
Pt called and request a CT of abdomen that was discussed during yesterday's visit.

## 2014-05-30 NOTE — Telephone Encounter (Signed)
CT was ordered, someone will call her regarding scheduling the appt

## 2014-06-04 ENCOUNTER — Telehealth: Payer: Self-pay

## 2014-06-04 NOTE — Telephone Encounter (Signed)
Pt called requesting lab results--after looking in chart--[facility]--pt states she will call back after GYN appt 06/05/2014 to confirm whether she wants CT scan

## 2014-06-05 ENCOUNTER — Encounter: Payer: Self-pay | Admitting: Obstetrics and Gynecology

## 2014-06-05 ENCOUNTER — Ambulatory Visit (INDEPENDENT_AMBULATORY_CARE_PROVIDER_SITE_OTHER): Payer: 59 | Admitting: Obstetrics and Gynecology

## 2014-06-05 VITALS — BP 128/79 | HR 93 | Ht 65.0 in | Wt 166.6 lb

## 2014-06-05 DIAGNOSIS — R1032 Left lower quadrant pain: Secondary | ICD-10-CM

## 2014-06-05 NOTE — Progress Notes (Signed)
Patient ID: Felicia HectorDonna E Horn, female   DOB: 06/28/1976, 38 y.o.   MRN: [id number] 38 yo G2P1101 with LMP 05/20/2014 presenting today for the evaluation of a 3-week history of left sided abdominal pain. Patient describes the pain as sharp intermittent. No aggravating or alleviating factors. Patient admits to not taking any tylenol or ibuprofen for the pain as it is "not that bad". She denies any changes in her bowel habits. Patient reports a history of diverticulitis and states that the pain feels a little bit similar.  Past Medical History  Diagnosis Date  . Protein C deficiency   . History of asthma     childhood  . Abnormal Pap smear     cryo age 54;colposcopy 2013  . History of diverticulitis of colon 11/2012   Past Surgical History  Procedure Laterality Date  . Cesarean section  06/08/00  . Cesarean section  11/05/04  . Colposcopy  2013  . Gynecologic cryosurgery  1999  . Colonoscopy  12/2012    mod diverticulosis Arlyce Dice([place])   Family History  Problem Relation Age of Onset  . Cancer Maternal Aunt 50    breast/colon  . Colon cancer Maternal Aunt   . Hypertension Mother   . Hypertension Father   . Diabetes Father   . Stroke Paternal Grandmother   . Prostate cancer Paternal Grandfather   . Pulmonary embolism Cousin 28    pulm embolism   History  Substance Use Topics  . Smoking status: Never Smoker   . Smokeless tobacco: Never Used  . Alcohol Use: Yes     Comment: social    Physical exam GENERAL: Well-developed, well-nourished female in no acute distress.  ABDOMEN: Soft, nontender, nondistended.  PELVIC: Normal external female genitalia. Vagina is pink and rugated.  Normal discharge. Normal appearing cervix. Uterus is normal in size. No adnexal mass or tenderness. EXTREMITIES: No cyanosis, clubbing, or edema, 2+ distal pulses.  A/P 38 yo with LLQ pain - Office pelvic ultrasound demonstrated presence of a 2 x 1.7 cm left ovarian cyst. Advised the use of ibuprofen/heat pad for  discomfort - Advised to follow up with CT abdomen as recommended by PCP to rule out diverticulitis - RTC in May 2016 for annual exam or PRN

## 2014-06-06 NOTE — Addendum Note (Signed)
Addended by: Roena MaladyEVONTENNO, Donyale Falcon Y on: 06/06/2014 01:12 PM   Modules accepted: Orders

## 2014-06-23 ENCOUNTER — Encounter: Payer: Self-pay | Admitting: Obstetrics and Gynecology

## 2014-06-30 ENCOUNTER — Other Ambulatory Visit: Payer: Self-pay | Admitting: *Deleted

## 2014-06-30 DIAGNOSIS — B3731 Acute candidiasis of vulva and vagina: Secondary | ICD-10-CM

## 2014-06-30 DIAGNOSIS — B373 Candidiasis of vulva and vagina: Secondary | ICD-10-CM

## 2014-06-30 MED ORDER — FLUCONAZOLE 150 MG PO TABS: 150.0000 mg | ORAL_TABLET | Freq: Once | ORAL | Status: DC

## 2014-06-30 NOTE — Telephone Encounter (Signed)
Patient needs a refill of diflucan.

## 2014-07-01 ENCOUNTER — Ambulatory Visit: Payer: 59 | Admitting: Obstetrics and Gynecology

## 2014-07-02 ENCOUNTER — Ambulatory Visit (INDEPENDENT_AMBULATORY_CARE_PROVIDER_SITE_OTHER): Payer: 59 | Admitting: Advanced Practice Midwife

## 2014-07-02 ENCOUNTER — Encounter: Payer: Self-pay | Admitting: Advanced Practice Midwife

## 2014-07-02 VITALS — BP 119/77 | HR 79 | Ht 65.0 in | Wt 165.0 lb

## 2014-07-02 DIAGNOSIS — B3731 Acute candidiasis of vulva and vagina: Secondary | ICD-10-CM

## 2014-07-02 DIAGNOSIS — B373 Candidiasis of vulva and vagina: Secondary | ICD-10-CM

## 2014-07-02 MED ORDER — TERCONAZOLE 0.4 % VA CREA: 1.0000 | TOPICAL_CREAM | Freq: Every day | VAGINAL | Status: DC

## 2014-07-02 MED ORDER — FLUCONAZOLE 150 MG PO TABS: 150.0000 mg | ORAL_TABLET | Freq: Once | ORAL | Status: DC

## 2014-07-02 NOTE — Addendum Note (Signed)
Addended by: Tandy GawHINTON, Rafiel Mecca C on: 07/02/2014 03:35 PM   Modules accepted: Orders

## 2014-07-02 NOTE — Progress Notes (Signed)
Subjective:     Patient ID: Josue HectorDonna E Clapp, female   DOB: 03/17/1976, 38 y.o.   MRN: [id number]  HPI 38 y.o. G2P1101 present to office with continued symptoms of yeast infection, including vaginal itching, after treatment with Diflucan.  She reports scant white vaginal discharge and external itching and would like to have testing.  She denies exposure to STDs but agrees to testing as offered today for symptoms.  She denies vaginal bleeding, vaginal itching/burning, urinary symptoms, h/a, dizziness, n/v, or fever/chills.    Review of Systems Review of Systems - General ROS: negative    Objective:   Physical Exam Physical Examination: General appearance - alert, well appearing, and in no distress, oriented to person, place, and time and acyanotic, in no respiratory distress  Pelvic exam: Cervix pink, visually closed, without lesion, scant white creamy discharge, vaginal walls and external genitalia normal Bimanual exam: Cervix 0/long/high, firm, anterior, neg CMT, uterus nontender, nonenlarged, adnexa without tenderness, enlargement, or mass    Assessment:     Vaginal discharge/itching     Plan:     Wet prep/GCC pending Diflucan 150 mg x1 additional dose Terazol to use externally for itching Return to office PRN

## 2014-07-03 LAB — WET PREP, GENITAL
Clue Cells Wet Prep HPF POC: NONE SEEN
TRICH WET PREP: NONE SEEN
Yeast Wet Prep HPF POC: NONE SEEN

## 2014-07-03 LAB — GC/CHLAMYDIA PROBE AMP
CT Probe RNA: NEGATIVE
GC Probe RNA: NEGATIVE

## 2014-10-08 ENCOUNTER — Encounter: Payer: Self-pay | Admitting: *Deleted

## 2014-10-08 ENCOUNTER — Telehealth: Payer: Self-pay | Admitting: *Deleted

## 2014-10-08 DIAGNOSIS — B379 Candidiasis, unspecified: Secondary | ICD-10-CM

## 2014-10-08 MED ORDER — FLUCONAZOLE 150 MG PO TABS: 150.0000 mg | ORAL_TABLET | Freq: Once | ORAL | Status: DC

## 2014-10-08 NOTE — Telephone Encounter (Signed)
Patient called and needed something called in for a yeast infection.  I have sent in Diflucan to patients pharmacy.  

## 2015-01-27 ENCOUNTER — Encounter: Payer: Self-pay | Admitting: Obstetrics and Gynecology

## 2015-01-27 ENCOUNTER — Ambulatory Visit (INDEPENDENT_AMBULATORY_CARE_PROVIDER_SITE_OTHER): Payer: 59 | Admitting: Obstetrics and Gynecology

## 2015-01-27 VITALS — BP 118/76 | HR 84 | Wt 178.0 lb

## 2015-01-27 DIAGNOSIS — Z3490 Encounter for supervision of normal pregnancy, unspecified, unspecified trimester: Secondary | ICD-10-CM

## 2015-01-27 DIAGNOSIS — O3680X1 Pregnancy with inconclusive fetal viability, fetus 1: Secondary | ICD-10-CM

## 2015-01-27 NOTE — Progress Notes (Signed)
Patient seen today for initial ob but ultrasound reveals a gestational sac and yolk sac without clear fetal pole, measuring 4029w5d. Patient will return in 3-4 weeks for initial ob following viability ultrasound.

## 2015-01-27 NOTE — Progress Notes (Signed)
Bedside ultrasound today measures 434w5d gestational sac.  Will bring patient back in 3 weeks for another ultrasound.

## 2015-02-17 ENCOUNTER — Ambulatory Visit (INDEPENDENT_AMBULATORY_CARE_PROVIDER_SITE_OTHER): Payer: 59 | Admitting: Family Medicine

## 2015-02-17 ENCOUNTER — Encounter: Payer: Self-pay | Admitting: Family Medicine

## 2015-02-17 VITALS — BP 118/79 | HR 81 | Wt 180.0 lb

## 2015-02-17 DIAGNOSIS — Z113 Encounter for screening for infections with a predominantly sexual mode of transmission: Secondary | ICD-10-CM | POA: Diagnosis not present

## 2015-02-17 DIAGNOSIS — Z1151 Encounter for screening for human papillomavirus (HPV): Secondary | ICD-10-CM | POA: Diagnosis not present

## 2015-02-17 DIAGNOSIS — O0990 Supervision of high risk pregnancy, unspecified, unspecified trimester: Secondary | ICD-10-CM | POA: Insufficient documentation

## 2015-02-17 DIAGNOSIS — O09529 Supervision of elderly multigravida, unspecified trimester: Secondary | ICD-10-CM | POA: Insufficient documentation

## 2015-02-17 DIAGNOSIS — O09521 Supervision of elderly multigravida, first trimester: Secondary | ICD-10-CM

## 2015-02-17 DIAGNOSIS — O99119 Other diseases of the blood and blood-forming organs and certain disorders involving the immune mechanism complicating pregnancy, unspecified trimester: Secondary | ICD-10-CM

## 2015-02-17 DIAGNOSIS — O3421 Maternal care for scar from previous cesarean delivery: Secondary | ICD-10-CM

## 2015-02-17 DIAGNOSIS — O34219 Maternal care for unspecified type scar from previous cesarean delivery: Secondary | ICD-10-CM

## 2015-02-17 DIAGNOSIS — Z98891 History of uterine scar from previous surgery: Secondary | ICD-10-CM | POA: Insufficient documentation

## 2015-02-17 DIAGNOSIS — O0991 Supervision of high risk pregnancy, unspecified, first trimester: Secondary | ICD-10-CM

## 2015-02-17 DIAGNOSIS — D6859 Other primary thrombophilia: Secondary | ICD-10-CM

## 2015-02-17 DIAGNOSIS — O9989 Other specified diseases and conditions complicating pregnancy, childbirth and the puerperium: Secondary | ICD-10-CM

## 2015-02-17 DIAGNOSIS — Z124 Encounter for screening for malignant neoplasm of cervix: Secondary | ICD-10-CM | POA: Diagnosis not present

## 2015-02-17 DIAGNOSIS — O09291 Supervision of pregnancy with other poor reproductive or obstetric history, first trimester: Secondary | ICD-10-CM

## 2015-02-17 MED ORDER — ENOXAPARIN SODIUM 40 MG/0.4ML ~~LOC~~ SOLN: 40.0000 mg | SUBCUTANEOUS | Status: DC

## 2015-02-17 NOTE — Patient Instructions (Signed)
First Trimester of Pregnancy The first trimester of pregnancy is from week 1 until the end of week 12 (months 1 through 3). A week after a sperm fertilizes an egg, the egg will implant on the wall of the uterus. This embryo will begin to develop into a baby. Genes from you and your partner are forming the baby. The female genes determine whether the baby is a boy or a girl. At 6-8 weeks, the eyes and face are formed, and the heartbeat can be seen on ultrasound. At the end of 12 weeks, all the baby's organs are formed.  Now that you are pregnant, you will want to do everything you can to have a healthy baby. Two of the most important things are to get good prenatal care and to follow your health care provider's instructions. Prenatal care is all the medical care you receive before the baby's birth. This care will help prevent, find, and treat any problems during the pregnancy and childbirth. BODY CHANGES Your body goes through many changes during pregnancy. The changes vary from woman to woman.   You may gain or lose a couple of pounds at first.  You may feel sick to your stomach (nauseous) and throw up (vomit). If the vomiting is uncontrollable, call your health care provider.  You may tire easily.  You may develop headaches that can be relieved by medicines approved by your health care provider.  You may urinate more often. Painful urination may mean you have a bladder infection.  You may develop heartburn as a result of your pregnancy.  You may develop constipation because certain hormones are causing the muscles that push waste through your intestines to slow down.  You may develop hemorrhoids or swollen, bulging veins (varicose veins).  Your breasts may begin to grow larger and become tender. Your nipples may stick out more, and the tissue that surrounds them (areola) may become darker.  Your gums may bleed and may be sensitive to brushing and flossing.  Dark spots or blotches  (chloasma, mask of pregnancy) may develop on your face. This will likely fade after the baby is born.  Your menstrual periods will stop.  You may have a loss of appetite.  You may develop cravings for certain kinds of food.  You may have changes in your emotions from day to day, such as being excited to be pregnant or being concerned that something may go wrong with the pregnancy and baby.  You may have more vivid and strange dreams.  You may have changes in your hair. These can include thickening of your hair, rapid growth, and changes in texture. Some women also have hair loss during or after pregnancy, or hair that feels dry or thin. Your hair will most likely return to normal after your baby is born. WHAT TO EXPECT AT YOUR PRENATAL VISITS During a routine prenatal visit:  You will be weighed to make sure you and the baby are growing normally.  Your blood pressure will be taken.  Your abdomen will be measured to track your baby's growth.  The fetal heartbeat will be listened to starting around week 10 or 12 of your pregnancy.  Test results from any previous visits will be discussed. Your health care provider may ask you:  How you are feeling.  If you are feeling the baby move.  If you have had any abnormal symptoms, such as leaking fluid, bleeding, severe headaches, or abdominal cramping.  If you have any questions. Other tests   that may be performed during your first trimester include:  Blood tests to find your blood type and to check for the presence of any previous infections. They will also be used to check for low iron levels (anemia) and Rh antibodies. Later in the pregnancy, blood tests for diabetes will be done along with other tests if problems develop.  Urine tests to check for infections, diabetes, or protein in the urine.  An ultrasound to confirm the proper growth and development of the baby.  An amniocentesis to check for possible genetic problems.  Fetal  screens for spina bifida and Down syndrome.  You may need other tests to make sure you and the baby are doing well. HOME CARE INSTRUCTIONS  Medicines  Follow your health care provider's instructions regarding medicine use. Specific medicines may be either safe or unsafe to take during pregnancy.  Take your prenatal vitamins as directed.  If you develop constipation, try taking a stool softener if your health care provider approves. Diet  Eat regular, well-balanced meals. Choose a variety of foods, such as meat or vegetable-based protein, fish, milk and low-fat dairy products, vegetables, fruits, and whole grain breads and cereals. Your health care provider will help you determine the amount of weight gain that is right for you.  Avoid raw meat and uncooked cheese. These carry germs that can cause birth defects in the baby.  Eating four or five small meals rather than three large meals a day may help relieve nausea and vomiting. If you start to feel nauseous, eating a few soda crackers can be helpful. Drinking liquids between meals instead of during meals also seems to help nausea and vomiting.  If you develop constipation, eat more high-fiber foods, such as fresh vegetables or fruit and whole grains. Drink enough fluids to keep your urine clear or pale yellow. Activity and Exercise  Exercise only as directed by your health care provider. Exercising will help you:  Control your weight.  Stay in shape.  Be prepared for labor and delivery.  Experiencing pain or cramping in the lower abdomen or low back is a good sign that you should stop exercising. Check with your health care provider before continuing normal exercises.  Try to avoid standing for long periods of time. Move your legs often if you must stand in one place for a long time.  Avoid heavy lifting.  Wear low-heeled shoes, and practice good posture.  You may continue to have sex unless your health care provider directs you  otherwise. Relief of Pain or Discomfort  Wear a good support bra for breast tenderness.   Take warm sitz baths to soothe any pain or discomfort caused by hemorrhoids. Use hemorrhoid cream if your health care provider approves.   Rest with your legs elevated if you have leg cramps or low back pain.  If you develop varicose veins in your legs, wear support hose. Elevate your feet for 15 minutes, 3-4 times a day. Limit salt in your diet. Prenatal Care  Schedule your prenatal visits by the twelfth week of pregnancy. They are usually scheduled monthly at first, then more often in the last 2 months before delivery.  Write down your questions. Take them to your prenatal visits.  Keep all your prenatal visits as directed by your health care provider. Safety  Wear your seat belt at all times when driving.  Make a list of emergency phone numbers, including numbers for family, friends, the hospital, and police and fire departments. General Tips    Ask your health care provider for a referral to a local prenatal education class. Begin classes no later than at the beginning of month 6 of your pregnancy.  Ask for help if you have counseling or nutritional needs during pregnancy. Your health care provider can offer advice or refer you to specialists for help with various needs.  Do not use hot tubs, steam rooms, or saunas.  Do not douche or use tampons or scented sanitary pads.  Do not cross your legs for long periods of time.  Avoid cat litter boxes and soil used by cats. These carry germs that can cause birth defects in the baby and possibly loss of the fetus by miscarriage or stillbirth.  Avoid all smoking, herbs, alcohol, and medicines not prescribed by your health care provider. Chemicals in these affect the formation and growth of the baby.  Schedule a dentist appointment. At home, brush your teeth with a soft toothbrush and be gentle when you floss. SEEK MEDICAL CARE IF:   You have  dizziness.  You have mild pelvic cramps, pelvic pressure, or nagging pain in the abdominal area.  You have persistent nausea, vomiting, or diarrhea.  You have a bad smelling vaginal discharge.  You have pain with urination.  You notice increased swelling in your face, hands, legs, or ankles. SEEK IMMEDIATE MEDICAL CARE IF:   You have a fever.  You are leaking fluid from your vagina.  You have spotting or bleeding from your vagina.  You have severe abdominal cramping or pain.  You have rapid weight gain or loss.  You vomit blood or material that looks like coffee grounds.  You are exposed to German measles and have never had them.  You are exposed to fifth disease or chickenpox.  You develop a severe headache.  You have shortness of breath.  You have any kind of trauma, such as from a fall or a car accident. Document Released: 08/02/2001 Document Revised: 12/23/2013 Document Reviewed: 06/18/2013 [employer] Patient Information 2015 [employer]. This information is not intended to replace advice given to you by your health care provider. Make sure you discuss any questions you have with your health care provider.  Breastfeeding Deciding to breastfeed is one of the best choices you can make for you and your baby. A change in hormones during pregnancy causes your breast tissue to grow and increases the number and size of your milk ducts. These hormones also allow proteins, sugars, and fats from your blood supply to make breast milk in your milk-producing glands. Hormones prevent breast milk from being released before your baby is born as well as prompt milk flow after birth. Once breastfeeding has begun, thoughts of your baby, as well as his or her sucking or crying, can stimulate the release of milk from your milk-producing glands.  BENEFITS OF BREASTFEEDING For Your Baby  Your first milk (colostrum) helps your baby's digestive system function better.   There are antibodies  in your milk that help your baby fight off infections.   Your baby has a lower incidence of asthma, allergies, and sudden infant death syndrome.   The nutrients in breast milk are better for your baby than infant formulas and are designed uniquely for your baby's needs.   Breast milk improves your baby's brain development.   Your baby is less likely to develop other conditions, such as childhood obesity, asthma, or type 2 diabetes mellitus.  For You   Breastfeeding helps to create a very special bond between   you and your baby.   Breastfeeding is convenient. Breast milk is always available at the correct temperature and costs nothing.   Breastfeeding helps to burn calories and helps you lose the weight gained during pregnancy.   Breastfeeding makes your uterus contract to its prepregnancy size faster and slows bleeding (lochia) after you give birth.   Breastfeeding helps to lower your risk of developing type 2 diabetes mellitus, osteoporosis, and breast or ovarian cancer later in life. SIGNS THAT YOUR BABY IS HUNGRY Early Signs of Hunger  Increased alertness or activity.  Stretching.  Movement of the head from side to side.  Movement of the head and opening of the mouth when the corner of the mouth or cheek is stroked (rooting).  Increased sucking sounds, smacking lips, cooing, sighing, or squeaking.  Hand-to-mouth movements.  Increased sucking of fingers or hands. Late Signs of Hunger  Fussing.  Intermittent crying. Extreme Signs of Hunger Signs of extreme hunger will require calming and consoling before your baby will be able to breastfeed successfully. Do not wait for the following signs of extreme hunger to occur before you initiate breastfeeding:   Restlessness.  A loud, strong cry.   Screaming. BREASTFEEDING BASICS Breastfeeding Initiation  Find a comfortable place to sit or lie down, with your neck and back well supported.  Place a pillow or  rolled up blanket under your baby to bring him or her to the level of your breast (if you are seated). Nursing pillows are specially designed to help support your arms and your baby while you breastfeed.  Make sure that your baby's abdomen is facing your abdomen.   Gently massage your breast. With your fingertips, massage from your chest wall toward your nipple in a circular motion. This encourages milk flow. You may need to continue this action during the feeding if your milk flows slowly.  Support your breast with 4 fingers underneath and your thumb above your nipple. Make sure your fingers are well away from your nipple and your baby's mouth.   Stroke your baby's lips gently with your finger or nipple.   When your baby's mouth is open wide enough, quickly bring your baby to your breast, placing your entire nipple and as much of the colored area around your nipple (areola) as possible into your baby's mouth.   More areola should be visible above your baby's upper lip than below the lower lip.   Your baby's tongue should be between his or her lower gum and your breast.   Ensure that your baby's mouth is correctly positioned around your nipple (latched). Your baby's lips should create a seal on your breast and be turned out (everted).  It is common for your baby to suck about 2-3 minutes in order to start the flow of breast milk. Latching Teaching your baby how to latch on to your breast properly is very important. An improper latch can cause nipple pain and decreased milk supply for you and poor weight gain in your baby. Also, if your baby is not latched onto your nipple properly, he or she may swallow some air during feeding. This can make your baby fussy. Burping your baby when you switch breasts during the feeding can help to get rid of the air. However, teaching your baby to latch on properly is still the best way to prevent fussiness from swallowing air while breastfeeding. Signs  that your baby has successfully latched on to your nipple:    Silent tugging or silent   sucking, without causing you pain.   Swallowing heard between every 3-4 sucks.    Muscle movement above and in front of his or her ears while sucking.  Signs that your baby has not successfully latched on to nipple:   Sucking sounds or smacking sounds from your baby while breastfeeding.  Nipple pain. If you think your baby has not latched on correctly, slip your finger into the corner of your baby's mouth to break the suction and place it between your baby's gums. Attempt breastfeeding initiation again. Signs of Successful Breastfeeding Signs from your baby:   A gradual decrease in the number of sucks or complete cessation of sucking.   Falling asleep.   Relaxation of his or her body.   Retention of a small amount of milk in his or her mouth.   Letting go of your breast by himself or herself. Signs from you:  Breasts that have increased in firmness, weight, and size 1-3 hours after feeding.   Breasts that are softer immediately after breastfeeding.  Increased milk volume, as well as a change in milk consistency and color by the fifth day of breastfeeding.   Nipples that are not sore, cracked, or bleeding. Signs That Your Baby is Getting Enough Milk  Wetting at least 3 diapers in a 24-hour period. The urine should be clear and pale yellow by age 5 days.  At least 3 stools in a 24-hour period by age 5 days. The stool should be soft and yellow.  At least 3 stools in a 24-hour period by age 7 days. The stool should be seedy and yellow.  No loss of weight greater than 10% of birth weight during the first 3 days of age.  Average weight gain of 4-7 ounces (113-198 g) per week after age 4 days.  Consistent daily weight gain by age 5 days, without weight loss after the age of 2 weeks. After a feeding, your baby may spit up a small amount. This is common. BREASTFEEDING FREQUENCY AND  DURATION Frequent feeding will help you make more milk and can prevent sore nipples and breast engorgement. Breastfeed when you feel the need to reduce the fullness of your breasts or when your baby shows signs of hunger. This is called "breastfeeding on demand." Avoid introducing a pacifier to your baby while you are working to establish breastfeeding (the first 4-6 weeks after your baby is born). After this time you may choose to use a pacifier. Research has shown that pacifier use during the first year of a baby's life decreases the risk of sudden infant death syndrome (SIDS). Allow your baby to feed on each breast as long as he or she wants. Breastfeed until your baby is finished feeding. When your baby unlatches or falls asleep while feeding from the first breast, offer the second breast. Because newborns are often sleepy in the first few weeks of life, you may need to awaken your baby to get him or her to feed. Breastfeeding times will vary from baby to baby. However, the following rules can serve as a guide to help you ensure that your baby is properly fed:  Newborns (babies 4 weeks of age or younger) may breastfeed every 1-3 hours.  Newborns should not go longer than 3 hours during the day or 5 hours during the night without breastfeeding.  You should breastfeed your baby a minimum of 8 times in a 24-hour period until you begin to introduce solid foods to your baby at around 6   months of age. BREAST MILK PUMPING Pumping and storing breast milk allows you to ensure that your baby is exclusively fed your breast milk, even at times when you are unable to breastfeed. This is especially important if you are going back to work while you are still breastfeeding or when you are not able to be present during feedings. Your lactation consultant can give you guidelines on how long it is safe to store breast milk.  A breast pump is a machine that allows you to pump milk from your breast into a sterile bottle.  The pumped breast milk can then be stored in a refrigerator or freezer. Some breast pumps are operated by hand, while others use electricity. Ask your lactation consultant which type will work best for you. Breast pumps can be purchased, but some hospitals and breastfeeding support groups lease breast pumps on a monthly basis. A lactation consultant can teach you how to hand express breast milk, if you prefer not to use a pump.  CARING FOR YOUR BREASTS WHILE YOU BREASTFEED Nipples can become dry, cracked, and sore while breastfeeding. The following recommendations can help keep your breasts moisturized and healthy:  Avoid using soap on your nipples.   Wear a supportive bra. Although not required, special nursing bras and tank tops are designed to allow access to your breasts for breastfeeding without taking off your entire bra or top. Avoid wearing underwire-style bras or extremely tight bras.  Air dry your nipples for 3-4minutes after each feeding.   Use only cotton bra pads to absorb leaked breast milk. Leaking of breast milk between feedings is normal.   Use lanolin on your nipples after breastfeeding. Lanolin helps to maintain your skin's normal moisture barrier. If you use pure lanolin, you do not need to wash it off before feeding your baby again. Pure lanolin is not toxic to your baby. You may also hand express a few drops of breast milk and gently massage that milk into your nipples and allow the milk to air dry. In the first few weeks after giving birth, some women experience extremely full breasts (engorgement). Engorgement can make your breasts feel heavy, warm, and tender to the touch. Engorgement peaks within 3-5 days after you give birth. The following recommendations can help ease engorgement:  Completely empty your breasts while breastfeeding or pumping. You may want to start by applying warm, moist heat (in the shower or with warm water-soaked hand towels) just before feeding or  pumping. This increases circulation and helps the milk flow. If your baby does not completely empty your breasts while breastfeeding, pump any extra milk after he or she is finished.  Wear a snug bra (nursing or regular) or tank top for 1-2 days to signal your body to slightly decrease milk production.  Apply ice packs to your breasts, unless this is too uncomfortable for you.  Make sure that your baby is latched on and positioned properly while breastfeeding. If engorgement persists after 48 hours of following these recommendations, contact your health care provider or a lactation consultant. OVERALL HEALTH CARE RECOMMENDATIONS WHILE BREASTFEEDING  Eat healthy foods. Alternate between meals and snacks, eating 3 of each per day. Because what you eat affects your breast milk, some of the foods may make your baby more irritable than usual. Avoid eating these foods if you are sure that they are negatively affecting your baby.  Drink milk, fruit juice, and water to satisfy your thirst (about 10 glasses a day).   Rest   often, relax, and continue to take your prenatal vitamins to prevent fatigue, stress, and anemia.  Continue breast self-awareness checks.  Avoid chewing and smoking tobacco.  Avoid alcohol and drug use. Some medicines that may be harmful to your baby can pass through breast milk. It is important to ask your health care provider before taking any medicine, including all over-the-counter and prescription medicine as well as vitamin and herbal supplements. It is possible to become pregnant while breastfeeding. If birth control is desired, ask your health care provider about options that will be safe for your baby. SEEK MEDICAL CARE IF:   You feel like you want to stop breastfeeding or have become frustrated with breastfeeding.  You have painful breasts or nipples.  Your nipples are cracked or bleeding.  Your breasts are red, tender, or warm.  You have a swollen area on either  breast.  You have a fever or chills.  You have nausea or vomiting.  You have drainage other than breast milk from your nipples.  Your breasts do not become full before feedings by the fifth day after you give birth.  You feel sad and depressed.  Your baby is too sleepy to eat well.  Your baby is having trouble sleeping.   Your baby is wetting less than 3 diapers in a 24-hour period.  Your baby has less than 3 stools in a 24-hour period.  Your baby's skin or the white part of his or her eyes becomes yellow.   Your baby is not gaining weight by 5 days of age. SEEK IMMEDIATE MEDICAL CARE IF:   Your baby is overly tired (lethargic) and does not want to wake up and feed.  Your baby develops an unexplained fever. Document Released: 08/08/2005 Document Revised: 08/13/2013 Document Reviewed: 01/30/2013 [employer] Patient Information 2015 [employer]. This information is not intended to replace advice given to you by your health care provider. Make sure you discuss any questions you have with your health care provider.  

## 2015-02-17 NOTE — Progress Notes (Signed)
  Subjective:    Felicia HectorDonna E Clapp is a 39 y.o. G3P1101 3822w1d being seen today for her initial obstetrical visit.  Patient reports nausea. Pregnancy history is complicated by Protein C deficiency, on Lovenox in previous pregnancies. Prior classical C-section.  Objective:    BP 118/79 mmHg  Pulse 81  Wt 180 lb (81.647 kg)  LMP 12/15/2014  Physical Exam  Exam  FHT: Fetal Heart Rate (bpm): 166  Uterine Size:  10 wk size  General:  WD/WN female   HEENT: Carbon/AT, sclera without icterus Neck: supple CV: RRR without murmur Lungs: Clear bilaterally Abdomen: soft, NT Breasts: no masses noted, symmetric Ext: no cyanosis/clubbing/edema GU: BUS normal, vagina is pink and rugated, cervix is nulliparous without lesion, no adnexal mass or tenderness, pelvis is adequate.  Pelvic sono-TVUS reveal SIUP c/w dates 9 w 0 days Assessment/Plan:    Pregnancy:  G3P1101   Problem List Items Addressed This Visit      Unprioritized   Previous cesarean delivery, antepartum-classical   Supervision of high risk pregnancy, antepartum - Primary   Relevant Orders   Cytology - PAP   Prenatal Profile   Culture, OB Urine   Protein C deficiency complicating pregnancy, antepartum   Relevant Medications   enoxaparin (LOVENOX) 40 MG/0.4ML injection   AMA (advanced maternal age) multigravida 35+    Declines genetics referral

## 2015-02-17 NOTE — Progress Notes (Signed)
Bedside US shows single IUP 2.24cm GA 9237w0d FHR 168

## 2015-02-19 LAB — PRENATAL PROFILE (SOLSTAS)
ANTIBODY SCREEN: NEGATIVE
BASOS PCT: 0 % (ref 0–1)
Basophils Absolute: 0 10*3/uL (ref 0.0–0.1)
EOS ABS: 0.2 10*3/uL (ref 0.0–0.7)
EOS PCT: 2 % (ref 0–5)
HCT: 40.1 % (ref 36.0–46.0)
HEP B S AG: NEGATIVE
HIV 1&2 Ab, 4th Generation: NONREACTIVE
Hemoglobin: 13.5 g/dL (ref 12.0–15.0)
Lymphocytes Relative: 20 % (ref 12–46)
Lymphs Abs: 1.9 10*3/uL (ref 0.7–4.0)
MCH: 30.1 pg (ref 26.0–34.0)
MCHC: 33.7 g/dL (ref 30.0–36.0)
MCV: 89.3 fL (ref 78.0–100.0)
MONOS PCT: 7 % (ref 3–12)
MPV: 10 fL (ref 8.6–12.4)
Monocytes Absolute: 0.7 10*3/uL (ref 0.1–1.0)
Neutro Abs: 6.7 10*3/uL (ref 1.7–7.7)
Neutrophils Relative %: 71 % (ref 43–77)
Platelets: 287 10*3/uL (ref 150–400)
RBC: 4.49 MIL/uL (ref 3.87–5.11)
RDW: 13.8 % (ref 11.5–15.5)
Rh Type: POSITIVE
Rubella: 6.28 Index — ABNORMAL HIGH (ref <0.90)
WBC: 9.5 10*3/uL (ref 4.0–10.5)

## 2015-02-19 LAB — CULTURE, OB URINE: Colony Count: 6000

## 2015-02-20 ENCOUNTER — Encounter: Payer: Self-pay | Admitting: Family Medicine

## 2015-02-20 DIAGNOSIS — N879 Dysplasia of cervix uteri, unspecified: Secondary | ICD-10-CM | POA: Insufficient documentation

## 2015-02-20 LAB — CYTOLOGY - PAP

## 2015-02-25 ENCOUNTER — Telehealth: Payer: Self-pay | Admitting: *Deleted

## 2015-02-25 DIAGNOSIS — O99119 Other diseases of the blood and blood-forming organs and certain disorders involving the immune mechanism complicating pregnancy, unspecified trimester: Secondary | ICD-10-CM

## 2015-02-25 DIAGNOSIS — D6859 Other primary thrombophilia: Secondary | ICD-10-CM

## 2015-02-25 MED ORDER — ENOXAPARIN SODIUM 40 MG/0.4ML ~~LOC~~ SOLN: 40.0000 mg | SUBCUTANEOUS | Status: DC

## 2015-02-25 NOTE — Telephone Encounter (Signed)
I have sent patient Lovenox rx to patient pharmacy.  Patient aware.

## 2015-03-02 ENCOUNTER — Encounter: Payer: Self-pay | Admitting: *Deleted

## 2015-03-17 ENCOUNTER — Encounter: Payer: 59 | Admitting: Obstetrics & Gynecology

## 2015-03-26 ENCOUNTER — Encounter: Payer: Self-pay | Admitting: Obstetrics & Gynecology

## 2015-03-26 ENCOUNTER — Ambulatory Visit (INDEPENDENT_AMBULATORY_CARE_PROVIDER_SITE_OTHER): Payer: 59 | Admitting: Obstetrics & Gynecology

## 2015-03-26 VITALS — BP 133/85 | HR 81 | Resp 16 | Ht 65.0 in | Wt 174.0 lb

## 2015-03-26 DIAGNOSIS — Z332 Encounter for elective termination of pregnancy: Secondary | ICD-10-CM | POA: Diagnosis not present

## 2015-03-26 NOTE — Progress Notes (Signed)
   Subjective:    Patient ID: [name], female    DOB: June 22, 1976, 39 y.o.   MRN: [id number]  HPI  39 yo S AA P1 (39 yo daughter) here for follow up after surgical TAB in [place]. She is having no bleeding, no fevers, no problems. She is no longer planning to be in a sexual relationship.  Review of Systems Pap negative 6/16    Objective:   Physical Exam  WNWHBFNAD Breathing, conversing, and ambulating normally Abd- benign      Assessment & Plan:  Post op TAB- doing well We discussed BTL.

## 2015-04-02 ENCOUNTER — Telehealth: Payer: Self-pay | Admitting: *Deleted

## 2015-04-02 DIAGNOSIS — B379 Candidiasis, unspecified: Secondary | ICD-10-CM

## 2015-04-02 MED ORDER — FLUCONAZOLE 150 MG PO TABS: 150.0000 mg | ORAL_TABLET | Freq: Once | ORAL | Status: DC

## 2015-04-02 NOTE — Telephone Encounter (Signed)
Patient called and is having symptoms of a yeast infection. She is having white discharge and itching.  I have called in Diflucan to her pharmacy. Pt aware.

## 2015-04-30 ENCOUNTER — Telehealth: Payer: Self-pay | Admitting: Obstetrics & Gynecology

## 2015-04-30 MED ORDER — FLUCONAZOLE 150 MG PO TABS: 150.0000 mg | ORAL_TABLET | Freq: Once | ORAL | Status: DC

## 2015-04-30 NOTE — Telephone Encounter (Signed)
Pt was wondering if she could get an rx for Diflucan called in? Pt uses the [facility] on [address]. Please advise

## 2015-04-30 NOTE — Telephone Encounter (Signed)
Pt c/o thick white vaginal discharge with itching, pt states she has been getting a yeast infection with her menstrual cycles over the past few months.  Sent rx to pharmacy, advised if she continues to have recurrent yeast to come in for office visit.

## 2015-05-27 ENCOUNTER — Telehealth: Payer: Self-pay | Admitting: *Deleted

## 2015-05-27 MED ORDER — FLUCONAZOLE 150 MG PO TABS: 150.0000 mg | ORAL_TABLET | Freq: Once | ORAL | Status: DC

## 2015-05-27 NOTE — Telephone Encounter (Signed)
Pt c/o vaginal itching with thick white discharge, pt reports that she commonly gets a yeast infection around the time of her cycle each month. Sent Diflucan to pharmacy.  Pt informed on instruction of use.

## 2015-05-27 NOTE — Telephone Encounter (Signed)
Called pt, no answer, left message for pt to call office.  

## 2015-05-27 NOTE — Telephone Encounter (Signed)
-----   Message from Olevia Bowens sent at 05/27/2015  1:28 PM EDT ----- Regarding: Wants Rx Contact: [phone] Patient called in with symptoms of a yeast infection wants to know if she can have something called in or would you rather her be seen

## 2015-05-30 ENCOUNTER — Emergency Department (INDEPENDENT_AMBULATORY_CARE_PROVIDER_SITE_OTHER)
Admission: EM | Admit: 2015-05-30 | Discharge: 2015-05-30 | Disposition: A | Discharge status: Home / Self Care | Payer: 59 | Source: Home / Self Care | Attending: Family Medicine | Admitting: Family Medicine

## 2015-05-30 ENCOUNTER — Encounter (HOSPITAL_COMMUNITY): Payer: Self-pay | Admitting: Emergency Medicine

## 2015-05-30 DIAGNOSIS — B370 Candidal stomatitis: Secondary | ICD-10-CM

## 2015-05-30 MED ORDER — FLUCONAZOLE 150 MG PO TABS: 150.0000 mg | ORAL_TABLET | Freq: Every day | ORAL | Status: DC

## 2015-05-30 MED ORDER — NYSTATIN 100000 UNIT/ML MT SUSP: 500000.0000 [IU] | Freq: Four times a day (QID) | OROMUCOSAL | Status: DC

## 2015-05-30 NOTE — ED Provider Notes (Signed)
CSN: [id number]     Arrival date & time 05/30/15  1716 History   First MD Initiated Contact with Patient 05/30/15 1815     Chief Complaint  Patient presents with  . Oral Swelling   (Consider location/radiation/quality/duration/timing/severity/associated sxs/prior Treatment) HPI Comments: 39 year old female presents to the urgent care with a complaint of white bumps to the back of the time and on the soft palate. She noticed this yesterday. She states it burns but is not painful. No pain with swallowing. She has not taken any body aches recently. She did take Diflucan of the liver a week ago for vaginal candidiasis.   Past Medical History  Diagnosis Date  . Protein C deficiency (HCC)   . History of asthma     childhood  . Abnormal Pap smear     cryo age 86;colposcopy 2013  . History of diverticulitis of colon 11/2012   Past Surgical History  Procedure Laterality Date  . Cesarean section  06/08/00  . Cesarean section  11/05/04  . Colposcopy  2013  . Gynecologic cryosurgery  1999  . Colonoscopy  12/2012    mod diverticulosis Arlyce Dice)   Family History  Problem Relation Age of Onset  . Cancer Maternal Aunt 50    breast/colon  . Colon cancer Maternal Aunt   . Hypertension Mother   . Hypertension Father   . Diabetes Father   . Stroke Paternal Grandmother   . Prostate cancer Paternal Grandfather   . Pulmonary embolism Cousin 28    pulm embolism   Social History  Substance Use Topics  . Smoking status: Never Smoker   . Smokeless tobacco: Never Used  . Alcohol Use: Yes     Comment: social   OB History    Gravida Para Term Preterm AB TAB SAB Ectopic Multiple Living   Review of Systems  Constitutional: Negative.   HENT: Positive for mouth sores. Negative for congestion, dental problem and sore throat.   Respiratory: Negative.   Gastrointestinal: Negative.   All other systems reviewed and are negative.   Allergies  Review of patient's allergies  indicates no known allergies.  Home Medications   Prior to Admission medications   Medication Sig Start Date End Date Taking? Authorizing Provider  fluconazole (DIFLUCAN) 150 MG tablet Take 1 tablet (150 mg total) by mouth daily. 05/30/15   Hayden Rasmussen, NP  nystatin (MYCOSTATIN) 100000 UNIT/ML suspension Take 5 mLs (500,000 Units total) by mouth 4 (four) times daily. 05/30/15   Hayden Rasmussen, NP   Meds Ordered and Administered this Visit  Medications - No data to display  BP 143/98 mmHg  Pulse 88  Temp(Src) 98.2 F (36.8 C) (Oral)  Resp 18  SpO2 99%  LMP 05/30/2015  Breastfeeding? No No data found.   Physical Exam  Constitutional: She appears well-developed and well-nourished. No distress.  HENT:  Mouth/Throat: No oropharyngeal exudate.  Tongue with white raised papules and patches primarily to the posterior aspect. There are a few isolated lesions to the uvula and soft palate. None seen to the posterior pharynx.  Neck: Normal range of motion. Neck supple.  Cardiovascular: Normal rate.   Pulmonary/Chest: Effort normal.  Lymphadenopathy:    She has no cervical adenopathy.  Neurological: She is alert. She exhibits normal muscle tone.  Skin: Skin is warm and dry. No rash noted.  Psychiatric: She has a normal mood and affect.  Nursing note and vitals reviewed.  ED Course  Procedures (including critical care time)  Labs Review Labs Reviewed - No data to display  Imaging Review No results found.   Visual Acuity Review  Right Eye Distance:   Left Eye Distance:   Bilateral Distance:    Right Eye Near:   Left Eye Near:    Bilateral Near:         MDM   1. Oral thrush    Oral nystatin suspension 10 meals gargle and swallow 4 times a day Diflucan 150 mg one every day for 8 days. Follow-up with your PCP.  Hayden Rasmussen, NP 05/30/15 [phone]

## 2015-05-30 NOTE — Discharge Instructions (Signed)
Thrush, Adult  Thrush, also called oral candidiasis, is a fungal infection that develops in the mouth and throat and on the tongue. It causes white patches to form on the mouth and tongue. Thrush is most common in older adults, but it can occur at any age.   Many cases of thrush are mild, but this infection can also be more serious. Thrush can be a recurring problem for people who have chronic illnesses or who take medicines that limit the body's ability to fight infection. Because these people have difficulty fighting infections, the fungus that causes thrush can spread throughout the body. This can cause life-threatening blood or organ infections.  CAUSES   Thrush is usually caused by a yeast called Candida albicans. This fungus is normally present in small amounts in the mouth and on other mucous membranes. It usually causes no harm. However, when conditions are present that allow the fungus to grow uncontrolled, it invades surrounding tissues and becomes an infection. Less often, other Candida species can also lead to thrush.   RISK FACTORS  Thrush is more likely to develop in the following people:  · People with an impaired ability to fight infection (weakened immune system).    · Older adults.    · People with HIV.    · People with diabetes.    · People with dry mouth (xerostomia).    · Pregnant women.    · People with poor dental care, especially those who have false teeth.    · People who use antibiotic medicines.    SIGNS AND SYMPTOMS   Thrush can be a mild infection that causes no symptoms. If symptoms develop, they may include:   · A burning feeling in the mouth and throat. This can occur at the start of a thrush infection.    · White patches that adhere to the mouth and tongue. The tissue around the patches may be red, raw, and painful. If rubbed (during tooth brushing, for example), the patches and the tissue of the mouth may bleed easily.    · A bad taste in the mouth or difficulty tasting foods.     · Cottony feeling in the mouth.    · Pain during eating and swallowing.  DIAGNOSIS   Your health care provider can usually diagnose thrush by looking in your mouth and asking you questions about your health.   TREATMENT   Medicines that help prevent the growth of fungi (antifungals) are the standard treatment for thrush. These medicines are either applied directly to the affected area (topical) or swallowed (oral). The treatment will depend on the severity of the condition.   Mild Thrush  Mild cases of thrush may clear up with the use of an antifungal mouth rinse or lozenges. Treatment usually lasts about 14 days.   Moderate to Severe Thrush  · More severe thrush infections that have spread to the esophagus are treated with an oral antifungal medicine. A topical antifungal medicine may also be used.    · For some severe infections, a treatment period longer than 14 days may be needed.    · Oral antifungal medicines are almost never used during pregnancy because the fetus may be harmed. However, if a pregnant woman has a rare, severe thrush infection that has spread to her blood, oral antifungal medicines may be used. In this case, the risk of harm to the mother and fetus from the severe thrush infection may be greater than the risk posed by the use of antifungal medicines.    Persistent or Recurrent Thrush  For cases of   thrush that do not go away or keep coming back, treatment may involve the following:   · Treatment may be needed twice as long as the symptoms last.    · Treatment will include both oral and topical antifungal medicines.    · People with weakened immune systems can take an antifungal medicine on a continuous basis to prevent thrush infections.    It is important to treat conditions that make you more likely to get thrush, such as diabetes or HIV.   HOME CARE INSTRUCTIONS   · Only take over-the-counter or prescription medicine as directed by your health care provider. Talk to your health care  provider about an over-the-counter medicine called gentian violet, which kills bacteria and fungi.    · Eat plain, unflavored yogurt as directed by your health care provider. Check the label to make sure the yogurt contains live cultures. This yogurt can help healthy bacteria grow in the mouth that can stop the growth of the fungus that causes thrush.    · Try these measures to help reduce the discomfort of thrush:      Drink cold liquids such as water or iced tea.      Try flavored ice treats or frozen juices.      Eat foods that are easy to swallow, such as gelatin, ice cream, or custard.      If the patches in your mouth are painful, try drinking from a straw.    · Rinse your mouth several times a day with a warm saltwater rinse. You can make the saltwater mixture with 1 tsp (6 g) of salt in 8 fl oz (0.2 L) of warm water.    · If you wear dentures, remove the dentures before going to bed, brush them vigorously, and soak them in a cleaning solution as directed by your health care provider.    · Women who are breastfeeding should clean their nipples with an antifungal medicine as directed by their health care provider. Dry the nipples after breastfeeding. Applying lanolin-containing body lotion may help relieve nipple soreness.    SEEK MEDICAL CARE IF:  · Your symptoms are getting worse or are not improving within 7 days of starting treatment.    · You have symptoms of spreading infection, such as white patches on the skin outside of the mouth.    · You are nursing and you have redness, burning, or pain in the nipples that is not relieved with treatment.    MAKE SURE YOU:  · Understand these instructions.  · Will watch your condition.  · Will get help right away if you are not doing well or get worse.     This information is not intended to replace advice given to you by your health care provider. Make sure you discuss any questions you have with your health care provider.     Document Released: 05/03/2004 Document  Revised: 08/29/2014 Document Reviewed: 03/11/2013  Elsevier Interactive Patient Education ©2016 Elsevier Inc.

## 2015-05-30 NOTE — ED Notes (Signed)
Reports white bumps on tongue onset this am Denies pain, fevers A&O x4... No acute distress.

## 2015-10-13 ENCOUNTER — Ambulatory Visit: Payer: 59 | Admitting: Obstetrics and Gynecology

## 2016-02-02 ENCOUNTER — Ambulatory Visit: Payer: 59 | Admitting: Obstetrics & Gynecology

## 2016-02-17 ENCOUNTER — Ambulatory Visit (INDEPENDENT_AMBULATORY_CARE_PROVIDER_SITE_OTHER): Payer: 59 | Admitting: Obstetrics & Gynecology

## 2016-02-17 ENCOUNTER — Encounter: Payer: Self-pay | Admitting: Obstetrics & Gynecology

## 2016-02-17 VITALS — BP 131/87 | HR 93 | Resp 18 | Ht 65.0 in | Wt 175.0 lb

## 2016-02-17 DIAGNOSIS — N852 Hypertrophy of uterus: Secondary | ICD-10-CM

## 2016-02-17 DIAGNOSIS — N939 Abnormal uterine and vaginal bleeding, unspecified: Secondary | ICD-10-CM

## 2016-02-17 DIAGNOSIS — Z1151 Encounter for screening for human papillomavirus (HPV): Secondary | ICD-10-CM | POA: Diagnosis not present

## 2016-02-17 DIAGNOSIS — B977 Papillomavirus as the cause of diseases classified elsewhere: Secondary | ICD-10-CM | POA: Diagnosis not present

## 2016-02-17 DIAGNOSIS — Z124 Encounter for screening for malignant neoplasm of cervix: Secondary | ICD-10-CM | POA: Diagnosis not present

## 2016-02-17 DIAGNOSIS — Z113 Encounter for screening for infections with a predominantly sexual mode of transmission: Secondary | ICD-10-CM

## 2016-02-17 NOTE — Addendum Note (Signed)
Addended by: Gita KudoLASSITER, KRISTEN S on: 02/17/2016 09:03 AM   Modules accepted: Orders

## 2016-02-17 NOTE — Patient Instructions (Signed)
Uterine Fibroids Uterine fibroids are tissue masses (tumors) that can develop in the womb (uterus). They are also called leiomyomas. This type of tumor is not cancerous (benign) and does not spread to other parts of the body outside of the pelvic area, which is between the hip bones. Occasionally, fibroids may develop in the fallopian tubes, in the cervix, or on the support structures (ligaments) that surround the uterus. You can have one or many fibroids. Fibroids can vary in size, weight, and where they grow in the uterus. Some can become quite large. Most fibroids do not require medical treatment. CAUSES A fibroid can develop when a single uterine cell keeps growing (replicating). Most cells in the human body have a control mechanism that keeps them from replicating without control. SIGNS AND SYMPTOMS Symptoms may include:   Heavy bleeding during your period.  Bleeding or spotting between periods.  Pelvic pain and pressure.  Bladder problems, such as needing to urinate more often (urinary frequency) or urgently.  Inability to reproduce offspring (infertility).  Miscarriages. DIAGNOSIS Uterine fibroids are diagnosed through a physical exam. Your health care provider may feel the lumpy tumors during a pelvic exam. Ultrasonography and an MRI may be done to determine the size, location, and number of fibroids. TREATMENT Treatment may include:  Watchful waiting. This involves getting the fibroid checked by your health care provider to see if it grows or shrinks. Follow your health care provider's recommendations for how often to have this checked.  Hormone medicines. These can be taken by mouth or given through an intrauterine device (IUD).  Surgery.  Removing the fibroids (myomectomy) or the uterus (hysterectomy).  Removing blood supply to the fibroids (uterine artery embolization). If fibroids interfere with your fertility and you want to become pregnant, your health care provider  may recommend having the fibroids removed.  HOME CARE INSTRUCTIONS  Keep all follow-up visits as directed by your health care provider. This is important.  Take medicines only as directed by your health care provider.  If you were prescribed a hormone treatment, take the hormone medicines exactly as directed.  Do not take aspirin, because it can cause bleeding.  Ask your health care provider about taking iron pills and increasing the amount of dark green, leafy vegetables in your diet. These actions can help to boost your blood iron levels, which may be affected by heavy menstrual bleeding.  Pay close attention to your period and tell your health care provider about any changes, such as:  Increased blood flow that requires you to use more pads or tampons than usual per month.  A change in the number of days that your period lasts per month.  A change in symptoms that are associated with your period, such as abdominal cramping or back pain. SEEK MEDICAL CARE IF:  You have pelvic pain, back pain, or abdominal cramps that cannot be controlled with medicines.  You have an increase in bleeding between and during periods.  You soak tampons or pads in a half hour or less.  You feel lightheaded, extra tired, or weak. SEEK IMMEDIATE MEDICAL CARE IF:  You faint.  You have a sudden increase in pelvic pain.   This information is not intended to replace advice given to you by your health care provider. Make sure you discuss any questions you have with your health care provider.   Document Released: 08/05/2000 Document Revised: 08/29/2014 Document Reviewed: 02/04/2014 Elsevier Interactive Patient Education 2016 Elsevier Inc.  

## 2016-02-17 NOTE — Progress Notes (Signed)
Pt here today for annual physical exam, only complaint is occasional irregular spotting around ovulation time period.

## 2016-02-17 NOTE — Progress Notes (Signed)
Patient ID: [name], female   DOB: 12/18/1975, 40 y.o.   MRN: [id number] History:  40 y.o. [id number] here today for f/u of AUB.  Pt reports 2 episodes of spotting between cycles.  She reports that each time it occurred around the time of her ovulation.  Pt reports that she has not been sexually active for a year but, requests STI screening.  She denies abnormal vaginal discharge.  She denies pelvic pain.    The following portions of the patient's history were reviewed and updated as appropriate: allergies, current medications, past family history, past medical history, past social history, past surgical history and problem list.  Review of Systems:  Pertinent items are noted in HPI.  Objective:  Physical Exam Blood pressure 131/87, pulse 93, resp. rate 18, height 5\' 5"  (1.651 m), weight 175 lb (79.379 kg), last menstrual period 01/22/2016. Gen: NAD Abd: Soft, nontender and nondistended Pelvic: Normal appearing external genitalia; normal appearing vaginal mucosa and cervix.  Normal discharge.  Enlarged uterus- 10 weeks sized, no other palpable masses, no uterine or adnexal tenderness  Labs and Imaging No results found.  Assessment & Plan:   Problem List Items Addressed This Visit    None    Visit Diagnoses    Abnormal uterine bleeding (AUB)    -  Primary    Relevant Orders    US Pelvis Complete    US Transvaginal Non-OB    Uterine enlargement        Relevant Orders    US Pelvis Complete    US Transvaginal Non-OB    High risk HPV infection        Routine screening for STI (sexually transmitted infection)         PAP done today due to hrHPV on prior PAP 02/17/2015  Aeson Sawyers L. Harraway-Smith, M.D., Evern CoreFACOG

## 2016-02-18 LAB — CYTOLOGY - PAP

## 2016-02-18 LAB — RPR

## 2016-02-18 LAB — HEPATITIS C ANTIBODY: HCV Ab: NEGATIVE

## 2016-02-18 LAB — HIV ANTIBODY (ROUTINE TESTING W REFLEX): HIV: NONREACTIVE

## 2016-02-18 LAB — HEPATITIS B SURFACE ANTIGEN: HEP B S AG: NEGATIVE

## 2016-02-25 ENCOUNTER — Telehealth: Payer: Self-pay | Admitting: *Deleted

## 2016-02-25 DIAGNOSIS — B3731 Acute candidiasis of vulva and vagina: Secondary | ICD-10-CM

## 2016-02-25 DIAGNOSIS — B373 Candidiasis of vulva and vagina: Secondary | ICD-10-CM

## 2016-02-25 NOTE — Telephone Encounter (Signed)
-----   Message from Olevia BowensJacinda S Battle sent at 02/25/2016  4:12 PM EDT ----- Regarding: Rx Request Contact: [phone] Would like something for yeast infection Uses [facility] on [address]

## 2016-02-26 MED ORDER — FLUCONAZOLE 150 MG PO TABS: 150.0000 mg | ORAL_TABLET | ORAL | Status: DC

## 2016-03-01 ENCOUNTER — Ambulatory Visit (HOSPITAL_COMMUNITY)
Admission: RE | Admit: 2016-03-01 | Discharge: 2016-03-01 | Disposition: A | Discharge status: Home / Self Care | Payer: 59 | Source: Ambulatory Visit | Attending: Obstetrics & Gynecology | Admitting: Obstetrics & Gynecology

## 2016-03-01 DIAGNOSIS — N939 Abnormal uterine and vaginal bleeding, unspecified: Secondary | ICD-10-CM | POA: Diagnosis not present

## 2016-03-01 DIAGNOSIS — N852 Hypertrophy of uterus: Secondary | ICD-10-CM

## 2016-03-02 ENCOUNTER — Telehealth: Payer: Self-pay | Admitting: *Deleted

## 2016-03-02 NOTE — Telephone Encounter (Signed)
Called pt, no answer, left message to call the office.  

## 2016-03-02 NOTE — Telephone Encounter (Signed)
Pt returned call, informed her of normal US, pt will continue to monitor bleeding pattern and if it progressively gets worse she will call back to schedule appt for further evaluation and treatment.

## 2016-03-02 NOTE — Telephone Encounter (Signed)
-----   Message from Carolyn Harraway-Smith, MD sent at 03/02/2016 10:10 AM EDT ----- Please call pt. Her sono was neg.  Thx, clh-S 

## 2016-03-02 NOTE — Telephone Encounter (Signed)
-----   Message from Willodean Rosenthalarolyn Harraway-Smith, MD sent at 03/02/2016 10:10 AM EDT ----- Please call pt. Her sono was neg.  Thx, clh-S

## 2016-04-22 ENCOUNTER — Encounter: Payer: Self-pay | Admitting: Family Medicine

## 2016-04-22 ENCOUNTER — Ambulatory Visit (INDEPENDENT_AMBULATORY_CARE_PROVIDER_SITE_OTHER): Payer: 59 | Admitting: Family Medicine

## 2016-04-22 VITALS — BP 110/60 | HR 79 | Temp 98.2°F | Wt 179.0 lb

## 2016-04-22 DIAGNOSIS — R21 Rash and other nonspecific skin eruption: Secondary | ICD-10-CM

## 2016-04-22 MED ORDER — TRIAMCINOLONE ACETONIDE 0.1 % EX CREA: 1.0000 "application " | TOPICAL_CREAM | Freq: Two times a day (BID) | CUTANEOUS | 0 refills | Status: DC

## 2016-04-22 MED ORDER — PERMETHRIN 5 % EX CREA: 1.0000 "application " | TOPICAL_CREAM | Freq: Once | CUTANEOUS | 0 refills | Status: AC

## 2016-04-22 NOTE — Assessment & Plan Note (Signed)
Most consistent with bug bites vs scabies - treat with permethrin and TCI cream. Update if not improved. Discussed washing all bedding/clothing.

## 2016-04-22 NOTE — Progress Notes (Signed)
Pre visit review using our clinic review tool, if applicable. No additional management support is needed unless otherwise documented below in the visit note. 

## 2016-04-22 NOTE — Patient Instructions (Signed)
Possible scabies vs other bug bites. Treat with permethrin anti scabies solution and then may use steroid cream sent to pharmacy. Let us know if not improving with treatment.

## 2016-04-22 NOTE — Progress Notes (Signed)
   BP 110/60 (BP Location: Left Arm, Patient Position: Sitting, Cuff Size: Normal)   Pulse 79   Temp 98.2 F (36.8 C) (Oral)   Wt 179 lb (81.2 kg)   SpO2 99%   BMI 29.79 kg/m    CC: check rash Subjective:    Patient ID: [name], female    DOB: 05/20/1976, 40 y.o.   MRN: [id number]  HPI: [name] is a 40 y.o. female presenting on 04/22/2016 for Rash (3 weeks on her palms, legs, abdomen)   3 wk h/o itchy bumps on wrists, ankles, legs, some on sides.  No outdoor exposure.  No recent travel to motel/hotel.   Denies new lotions, detergents, soaps or shampoos, new medicines, new foods.  No one else at home with similar rash.  No fevers/chills, joint pain, abd pain or nausea.   Relevant past medical, surgical, family and social history reviewed and updated as indicated. Interim medical history since our last visit reviewed. Allergies and medications reviewed and updated. Current Outpatient Prescriptions on File Prior to Visit  Medication Sig  . ibuprofen (ADVIL,MOTRIN) 800 MG tablet Take by mouth.   No current facility-administered medications on file prior to visit.     Review of Systems Per HPI unless specifically indicated in ROS section     Objective:    BP 110/60 (BP Location: Left Arm, Patient Position: Sitting, Cuff Size: Normal)   Pulse 79   Temp 98.2 F (36.8 C) (Oral)   Wt 179 lb (81.2 kg)   SpO2 99%   BMI 29.79 kg/m   Wt Readings from Last 3 Encounters:  04/22/16 179 lb (81.2 kg)  02/17/16 175 lb (79.4 kg)  03/26/15 174 lb (78.9 kg)    Physical Exam  Constitutional: She appears well-developed and well-nourished. No distress.  Skin: Skin is warm and dry. Rash noted.  Few isolated pruritic papules dorsal and medial wrists as well as proximal forearms, around ankles and posterior knee, 1 isolated papule bilateral abd sides  Nursing note and vitals reviewed.     Assessment & Plan:   Problem List Items Addressed This Visit    Skin rash - Primary      Most consistent with bug bites vs scabies - treat with permethrin and TCI cream. Update if not improved. Discussed washing all bedding/clothing.        Other Visit Diagnoses   None.      Follow up plan: Return if symptoms worsen or fail to improve.  Eustaquio BoydenJavier Emlyn Maves, MD

## 2016-06-10 ENCOUNTER — Ambulatory Visit (INDEPENDENT_AMBULATORY_CARE_PROVIDER_SITE_OTHER): Payer: 59 | Admitting: Family Medicine

## 2016-06-10 ENCOUNTER — Encounter: Payer: Self-pay | Admitting: Family Medicine

## 2016-06-10 VITALS — BP 122/86 | HR 79 | Temp 98.7°F | Wt 178.5 lb

## 2016-06-10 DIAGNOSIS — N926 Irregular menstruation, unspecified: Secondary | ICD-10-CM | POA: Diagnosis not present

## 2016-06-10 DIAGNOSIS — L03012 Cellulitis of left finger: Secondary | ICD-10-CM | POA: Diagnosis not present

## 2016-06-10 MED ORDER — AMOXICILLIN-POT CLAVULANATE 875-125 MG PO TABS: 1.0000 | ORAL_TABLET | Freq: Two times a day (BID) | ORAL | 0 refills | Status: DC

## 2016-06-10 NOTE — Patient Instructions (Signed)
Please start your prenatal vitamin Take your antibiotic with food and a full glass of water  First Trimester of Pregnancy The first trimester of pregnancy is from week 1 until the end of week 12 (months 1 through 3). A week after a sperm fertilizes an egg, the egg will implant on the wall of the uterus. This embryo will begin to develop into a baby. Genes from you and your partner are forming the baby. The female genes determine whether the baby is a boy or a girl. At 6-8 weeks, the eyes and face are formed, and the heartbeat can be seen on ultrasound. At the end of 12 weeks, all the baby's organs are formed.  Now that you are pregnant, you will want to do everything you can to have a healthy baby. Two of the most important things are to get good prenatal care and to follow your health care provider's instructions. Prenatal care is all the medical care you receive before the baby's birth. This care will help prevent, find, and treat any problems during the pregnancy and childbirth. BODY CHANGES Your body goes through many changes during pregnancy. The changes vary from woman to woman.   You may gain or lose a couple of pounds at first.  You may feel sick to your stomach (nauseous) and throw up (vomit). If the vomiting is uncontrollable, call your health care provider.  You may tire easily.  You may develop headaches that can be relieved by medicines approved by your health care provider.  You may urinate more often. Painful urination may mean you have a bladder infection.  You may develop heartburn as a result of your pregnancy.  You may develop constipation because certain hormones are causing the muscles that push waste through your intestines to slow down.  You may develop hemorrhoids or swollen, bulging veins (varicose veins).  Your breasts may begin to grow larger and become tender. Your nipples may stick out more, and the tissue that surrounds them (areola) may become darker.  Your  gums may bleed and may be sensitive to brushing and flossing.  Dark spots or blotches (chloasma, mask of pregnancy) may develop on your face. This will likely fade after the baby is born.  Your menstrual periods will stop.  You may have a loss of appetite.  You may develop cravings for certain kinds of food.  You may have changes in your emotions from day to day, such as being excited to be pregnant or being concerned that something may go wrong with the pregnancy and baby.  You may have more vivid and strange dreams.  You may have changes in your hair. These can include thickening of your hair, rapid growth, and changes in texture. Some women also have hair loss during or after pregnancy, or hair that feels dry or thin. Your hair will most likely return to normal after your baby is born. WHAT TO EXPECT AT YOUR PRENATAL VISITS During a routine prenatal visit:  You will be weighed to make sure you and the baby are growing normally.  Your blood pressure will be taken.  Your abdomen will be measured to track your baby's growth.  The fetal heartbeat will be listened to starting around week 10 or 12 of your pregnancy.  Test results from any previous visits will be discussed. Your health care provider may ask you:  How you are feeling.  If you are feeling the baby move.  If you have had any abnormal symptoms, such  as leaking fluid, bleeding, severe headaches, or abdominal cramping.  If you are using any tobacco products, including cigarettes, chewing tobacco, and electronic cigarettes.  If you have any questions. Other tests that may be performed during your first trimester include:  Blood tests to find your blood type and to check for the presence of any previous infections. They will also be used to check for low iron levels (anemia) and Rh antibodies. Later in the pregnancy, blood tests for diabetes will be done along with other tests if problems develop.  Urine tests to check  for infections, diabetes, or protein in the urine.  An ultrasound to confirm the proper growth and development of the baby.  An amniocentesis to check for possible genetic problems.  Fetal screens for spina bifida and Down syndrome.  You may need other tests to make sure you and the baby are doing well.  HIV (human immunodeficiency virus) testing. Routine prenatal testing includes screening for HIV, unless you choose not to have this test. HOME CARE INSTRUCTIONS  Medicines  Follow your health care provider's instructions regarding medicine use. Specific medicines may be either safe or unsafe to take during pregnancy.  Take your prenatal vitamins as directed.  If you develop constipation, try taking a stool softener if your health care provider approves. Diet  Eat regular, well-balanced meals. Choose a variety of foods, such as meat or vegetable-based protein, fish, milk and low-fat dairy products, vegetables, fruits, and whole grain breads and cereals. Your health care provider will help you determine the amount of weight gain that is right for you.  Avoid raw meat and uncooked cheese. These carry germs that can cause birth defects in the baby.  Eating four or five small meals rather than three large meals a day may help relieve nausea and vomiting. If you start to feel nauseous, eating a few soda crackers can be helpful. Drinking liquids between meals instead of during meals also seems to help nausea and vomiting.  If you develop constipation, eat more high-fiber foods, such as fresh vegetables or fruit and whole grains. Drink enough fluids to keep your urine clear or pale yellow. Activity and Exercise  Exercise only as directed by your health care provider. Exercising will help you:  Control your weight.  Stay in shape.  Be prepared for labor and delivery.  Experiencing pain or cramping in the lower abdomen or low back is a good sign that you should stop exercising. Check with  your health care provider before continuing normal exercises.  Try to avoid standing for long periods of time. Move your legs often if you must stand in one place for a long time.  Avoid heavy lifting.  Wear low-heeled shoes, and practice good posture.  You may continue to have sex unless your health care provider directs you otherwise. Relief of Pain or Discomfort  Wear a good support bra for breast tenderness.   Take warm sitz baths to soothe any pain or discomfort caused by hemorrhoids. Use hemorrhoid cream if your health care provider approves.   Rest with your legs elevated if you have leg cramps or low back pain.  If you develop varicose veins in your legs, wear support hose. Elevate your feet for 15 minutes, 3-4 times a day. Limit salt in your diet. Prenatal Care  Schedule your prenatal visits by the twelfth week of pregnancy. They are usually scheduled monthly at first, then more often in the last 2 months before delivery.  Write down your  questions. Take them to your prenatal visits.  Keep all your prenatal visits as directed by your health care provider. Safety  Wear your seat belt at all times when driving.  Make a list of emergency phone numbers, including numbers for family, friends, the hospital, and police and fire departments. General Tips  Ask your health care provider for a referral to a local prenatal education class. Begin classes no later than at the beginning of month 6 of your pregnancy.  Ask for help if you have counseling or nutritional needs during pregnancy. Your health care provider can offer advice or refer you to specialists for help with various needs.  Do not use hot tubs, steam rooms, or saunas.  Do not douche or use tampons or scented sanitary pads.  Do not cross your legs for long periods of time.  Avoid cat litter boxes and soil used by cats. These carry germs that can cause birth defects in the baby and possibly loss of the fetus by  miscarriage or stillbirth.  Avoid all smoking, herbs, alcohol, and medicines not prescribed by your health care provider. Chemicals in these affect the formation and growth of the baby.  Do not use any tobacco products, including cigarettes, chewing tobacco, and electronic cigarettes. If you need help quitting, ask your health care provider. You may receive counseling support and other resources to help you quit.  Schedule a dentist appointment. At home, brush your teeth with a soft toothbrush and be gentle when you floss. SEEK MEDICAL CARE IF:   You have dizziness.  You have mild pelvic cramps, pelvic pressure, or nagging pain in the abdominal area.  You have persistent nausea, vomiting, or diarrhea.  You have a bad smelling vaginal discharge.  You have pain with urination.  You notice increased swelling in your face, hands, legs, or ankles. SEEK IMMEDIATE MEDICAL CARE IF:   You have a fever.  You are leaking fluid from your vagina.  You have spotting or bleeding from your vagina.  You have severe abdominal cramping or pain.  You have rapid weight gain or loss.  You vomit blood or material that looks like coffee grounds.  You are exposed to [place] measles and have never had them.  You are exposed to fifth disease or chickenpox.  You develop a severe headache.  You have shortness of breath.  You have any kind of trauma, such as from a fall or a car accident.   This information is not intended to replace advice given to you by your health care provider. Make sure you discuss any questions you have with your health care provider.   Document Released: 08/02/2001 Document Revised: 08/29/2014 Document Reviewed: 06/18/2013 Elsevier Interactive Patient Education 2016 [employer]  Paronychia Paronychia is an infection of the skin that surrounds a nail. It usually affects the skin around a fingernail, but it may also occur near a toenail. It often causes pain and swelling  around the nail. This condition may come on suddenly or develop over a longer period. In some cases, a collection of pus (abscess) can form near or under the nail. Usually, paronychia is not serious and it clears up with treatment. CAUSES This condition may be caused by bacteria or fungi. It is commonly caused by either Streptococcus or Staphylococcus bacteria. The bacteria or fungi often cause the infection by getting into the affected area through an opening in the skin, such as a cut or a hangnail. RISK FACTORS This condition is more likely  to develop in:  People who get their hands wet often, such as those who work as Fish farm manager, bartenders, or nurses.  People who bite their fingernails or suck their thumbs.  People who trim their nails too short.  People who have hangnails or injured fingertips.  People who get manicures.  People who have diabetes. SYMPTOMS Symptoms of this condition include:  Redness and swelling of the skin near the nail.  Tenderness around the nail when you touch the area.  Pus-filled bumps under the cuticle. The cuticle is the skin at the base or sides of the nail.  Fluid or pus under the nail.  Throbbing pain in the area. DIAGNOSIS This condition is usually diagnosed with a physical exam. In some cases, a sample of pus may be taken from an abscess to be tested in a lab. This can help to determine what type of bacteria or fungi is causing the condition. TREATMENT Treatment for this condition depends on the cause and severity of the condition. If the condition is mild, it may clear up on its own in a few days. Your health care provider may recommend soaking the affected area in warm water a few times a day. When treatment is needed, the options may include:  Antibiotic medicine, if the condition is caused by a bacterial infection.  Antifungal medicine, if the condition is caused by a fungal infection.  Incision and drainage, if an abscess is present. In  this procedure, the health care provider will cut open the abscess so the pus can drain out. HOME CARE INSTRUCTIONS  Soak the affected area in warm water if directed to do so by your health care provider. You may be told to do this for 20 minutes, 2-3 times a day. Keep the area dry in between soakings.  Take medicines only as directed by your health care provider.  If you were prescribed an antibiotic medicine, finish all of it even if you start to feel better.  Keep the affected area clean.  Do not try to drain a fluid-filled bump yourself.  If you will be washing dishes or performing other tasks that require your hands to get wet, wear rubber gloves. You should also wear gloves if your hands might come in contact with irritating substances, such as cleaners or chemicals.  Follow your health care provider's instructions about:  Wound care.  Bandage (dressing) changes and removal. SEEK MEDICAL CARE IF:  Your symptoms get worse or do not improve with treatment.  You have a fever or chills.  You have redness spreading from the affected area.  You have continued or increased fluid, blood, or pus coming from the affected area.  Your finger or knuckle becomes swollen or is difficult to move.   This information is not intended to replace advice given to you by your health care provider. Make sure you discuss any questions you have with your health care provider.   Document Released: 02/01/2001 Document Revised: 12/23/2014 Document Reviewed: 07/16/2014 Elsevier Interactive Patient Education [employer]

## 2016-06-10 NOTE — Progress Notes (Signed)
Subjective:    Patient ID: [name], female    DOB: 12/14/1975, 40 y.o.   MRN: [id number]  HPI This is a 40 yo female who presents today with pain and swelling of left third finger. Eight days ago she was cleaning a microwave when she cut the tip of her finger on a piece of metal. She did not have any retained material. Little bleeding. She has been cleaning the area with soap, water and alcohol as well as doing warm compresses. The area has continued to become more red and painful and has spread around her nail bed.   LMP 05/01/16, did an at home pregnancy test yesterday which was positive. She and her fiance are pleased with the pregnancy. She is not currently taking prenatal vitamins but knows she needs to start. No nausea/vomiting, no breast tenderness.   Past Medical History:  Diagnosis Date  . Abnormal Pap smear    cryo age 24;colposcopy 2013  . History of asthma    childhood  . History of diverticulitis of colon 11/2012  . 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 CESAREAN SECTION  06/08/00  . CESAREAN SECTION  11/05/04  . COLONOSCOPY  12/2012   mod diverticulosis Arlyce Dice([place])  . COLPOSCOPY  2013  . GYNECOLOGIC CRYOSURGERY  1999   Family History  Problem Relation Age of Onset  . Cancer Maternal Aunt 50    breast/colon  . Colon cancer Maternal Aunt   . Hypertension Mother   . Hypertension Father   . Diabetes Father   . Stroke Paternal Grandmother   . Prostate cancer Paternal Grandfather   . Pulmonary embolism Cousin 28    pulm embolism   Social History  Substance Use Topics  . Smoking status: Never Smoker  . Smokeless tobacco: Never Used  . Alcohol use Yes     Comment: social      Review of Systems Per HPI    Objective:   Physical Exam  Constitutional: She is oriented to person, place, and time. She appears well-developed and well-nourished.  HENT:  Head: Normocephalic and atraumatic.  Eyes: Conjunctivae are  normal.  Cardiovascular: Normal rate.   Pulmonary/Chest: Effort normal.  Neurological: She is alert and oriented to person, place, and time.  Skin: Skin is warm and dry. She is not diaphoretic.  Left third finger with erythema and edema around tip and lateral nail bed. Firm, tender. No drainage.   Psychiatric: She has a normal mood and affect. Her behavior is normal. Judgment and thought content normal.  Vitals reviewed.     BP 122/86   Pulse 79   Temp 98.7 F (37.1 C) (Oral)   Wt 178 lb 8 oz (81 kg)   LMP 05/07/2016   SpO2 98%   BMI 29.70 kg/m  Wt Readings from Last 3 Encounters:  06/10/16 178 lb 8 oz (81 kg)  04/22/16 179 lb (81.2 kg)  02/17/16 175 lb (79.4 kg)       Assessment & Plan:  1. Paronychia of finger of left hand - Provided written and verbal information regarding diagnosis and treatment. - warm soaks - amoxicillin-clavulanate (AUGMENTIN) 875-125 MG tablet; Take 1 tablet by mouth 2 (two) times daily.  Dispense: 14 tablet; Refill: 0- discussed use of medication in pregnancy, category B  2. Missed period - per patient report, positive home pregnancy test - Provided written and verbal information regarding diagnosis and treatment. - start prenatal vitamin  Olean Ree, FNP-BC  [facility], [facility]  06/10/2016 1:10 PM

## 2016-07-05 ENCOUNTER — Inpatient Hospital Stay (HOSPITAL_COMMUNITY)
Admission: AD | Admit: 2016-07-05 | Discharge: 2016-07-05 | Disposition: A | Discharge status: Home / Self Care | Payer: 59 | Source: Ambulatory Visit | Attending: Obstetrics and Gynecology | Admitting: Obstetrics and Gynecology

## 2016-07-05 ENCOUNTER — Encounter (HOSPITAL_COMMUNITY): Payer: Self-pay

## 2016-07-05 ENCOUNTER — Inpatient Hospital Stay (HOSPITAL_COMMUNITY): Payer: 59

## 2016-07-05 DIAGNOSIS — Z3A08 8 weeks gestation of pregnancy: Secondary | ICD-10-CM | POA: Insufficient documentation

## 2016-07-05 DIAGNOSIS — O209 Hemorrhage in early pregnancy, unspecified: Secondary | ICD-10-CM | POA: Diagnosis not present

## 2016-07-05 DIAGNOSIS — O200 Threatened abortion: Secondary | ICD-10-CM | POA: Insufficient documentation

## 2016-07-05 LAB — CBC
HCT: 37.5 % (ref 36.0–46.0)
Hemoglobin: 13.1 g/dL (ref 12.0–15.0)
MCH: 29.6 pg (ref 26.0–34.0)
MCHC: 34.9 g/dL (ref 30.0–36.0)
MCV: 84.7 fL (ref 78.0–100.0)
Platelets: 254 10*3/uL (ref 150–400)
RBC: 4.43 MIL/uL (ref 3.87–5.11)
RDW: 13.6 % (ref 11.5–15.5)
WBC: 8.7 10*3/uL (ref 4.0–10.5)

## 2016-07-05 LAB — WET PREP, GENITAL
Clue Cells Wet Prep HPF POC: NONE SEEN
Sperm: NONE SEEN
Trich, Wet Prep: NONE SEEN
Yeast Wet Prep HPF POC: NONE SEEN

## 2016-07-05 LAB — URINALYSIS, ROUTINE W REFLEX MICROSCOPIC
Bilirubin Urine: NEGATIVE
Glucose, UA: NEGATIVE mg/dL
Ketones, ur: NEGATIVE mg/dL
LEUKOCYTES UA: NEGATIVE
NITRITE: NEGATIVE
PROTEIN: NEGATIVE mg/dL
pH: 6 (ref 5.0–8.0)

## 2016-07-05 LAB — URINE MICROSCOPIC-ADD ON
BACTERIA UA: NONE SEEN
WBC UA: NONE SEEN WBC/hpf (ref 0–5)

## 2016-07-05 LAB — POCT PREGNANCY, URINE: PREG TEST UR: POSITIVE — AB

## 2016-07-05 LAB — HCG, QUANTITATIVE, PREGNANCY: hCG, Beta Chain, Quant, S: 74021 m[IU]/mL — ABNORMAL HIGH (ref <5)

## 2016-07-05 NOTE — Discharge Instructions (Signed)
Vaginal Bleeding During Pregnancy, First Trimester °A small amount of bleeding (spotting) from the vagina is relatively common in early pregnancy. It usually stops on its own. Various things may cause bleeding or spotting in early pregnancy. Some bleeding may be related to the pregnancy, and some may not. In most cases, the bleeding is normal and is not a problem. However, bleeding can also be a sign of something serious. Be sure to tell your health care provider about any vaginal bleeding right away. °Some possible causes of vaginal bleeding during the first trimester include: °· Infection or inflammation of the cervix. °· Growths (polyps) on the cervix. °· Miscarriage or threatened miscarriage. °· Pregnancy tissue has developed outside of the uterus and in a fallopian tube (tubal pregnancy). °· Tiny cysts have developed in the uterus instead of pregnancy tissue (molar pregnancy). °Follow these instructions at home: °Watch your condition for any changes. The following actions may help to lessen any discomfort you are feeling: °· Follow your health care provider's instructions for limiting your activity. If your health care provider orders bed rest, you may need to stay in bed and only get up to use the bathroom. However, your health care provider may allow you to continue light activity. °· If needed, make plans for someone to help with your regular activities and responsibilities while you are on bed rest. °· Keep track of the number of pads you use each day, how often you change pads, and how soaked (saturated) they are. Write this down. °· Do not use tampons. Do not douche. °· Do not have sexual intercourse or orgasms until approved by your health care provider. °· If you pass any tissue from your vagina, save the tissue so you can show it to your health care provider. °· Only take over-the-counter or prescription medicines as directed by your health care provider. °· Do not take aspirin because it can make you  bleed. °· Keep all follow-up appointments as directed by your health care provider. °Contact a health care provider if: °· You have any vaginal bleeding during any part of your pregnancy. °· You have cramps or labor pains. °· You have a fever, not controlled by medicine. °Get help right away if: °· You have severe cramps in your back or belly (abdomen). °· You pass large clots or tissue from your vagina. °· Your bleeding increases. °· You feel light-headed or weak, or you have fainting episodes. °· You have chills. °· You are leaking fluid or have a gush of fluid from your vagina. °· You pass out while having a bowel movement. °This information is not intended to replace advice given to you by your health care provider. Make sure you discuss any questions you have with your health care provider. °Document Released: 05/18/2005 Document Revised: 01/14/2016 Document Reviewed: 04/15/2013 °Elsevier Interactive Patient Education © 2017 Elsevier Inc. ° °

## 2016-07-05 NOTE — MAU Provider Note (Signed)
History     CSN: [id number]  Arrival date and time: 07/05/16 1737   First Provider Initiated Contact with Patient 07/05/16 1810      Chief Complaint  Patient presents with  . Vaginal Bleeding   HPI Felicia HectorDonna E Horn is a 40 y.o. [id number] at 3763w3d by LMP who presents with vaginal bleeding. Reports small amount of dark red vaginal blood in panty liner this morning. Bleeding has continued but decreased to small amount of brown spotting on toilet paper. Denies abdominal pain, n/v/d, vaginal discharge, or recent intercourse. Constipation; last BM this morning; did not strain. [facility] first appt this Friday.   OB History    Gravida Para Term Preterm AB Living   4 2 1 1 1 1    SAB TAB Ectopic Multiple Live Births     1     2      Past Medical History:  Diagnosis Date  . Abnormal Pap smear    cryo age 29;colposcopy 2013  . History of asthma    childhood  . History of diverticulitis of colon 11/2012  . Protein C deficiency [facility](HCC)     Past Surgical History:  Procedure Laterality Date  . CESAREAN SECTION  06/08/00  . CESAREAN SECTION  11/05/04  . COLONOSCOPY  12/2012   mod diverticulosis Arlyce Dice([place])  . COLPOSCOPY  2013  . GYNECOLOGIC CRYOSURGERY  1999    Family History  Problem Relation Age of Onset  . Hypertension Mother   . Hypertension Father   . Diabetes Father   . Cancer Maternal Aunt 50    breast/colon  . Colon cancer Maternal Aunt   . Stroke Paternal Grandmother   . Prostate cancer Paternal Grandfather   . Pulmonary embolism Cousin 28    pulm embolism    Social History  Substance Use Topics  . Smoking status: Never Smoker  . Smokeless tobacco: Never Used  . Alcohol use Yes     Comment: social    Allergies:  Allergies  Allergen Reactions  . Other Hives    Eggplant, pecans, corn, walnuts, tobacco    Prescriptions Prior to Admission  Medication Sig Dispense Refill Last Dose  . amoxicillin-clavulanate (AUGMENTIN) 875-125 MG tablet Take 1 tablet by  mouth 2 (two) times daily. 14 tablet 0     Review of Systems  Constitutional: Negative.   Gastrointestinal: Positive for constipation. Negative for abdominal pain, diarrhea, nausea and vomiting.  Genitourinary: Negative for dysuria.       + vaginal bleeding   Physical Exam   Blood pressure 135/87, pulse 90, temperature 98.3 F (36.8 C), temperature source Oral, resp. rate 18, last menstrual period 05/07/2016.  Physical Exam  Nursing note and vitals reviewed. Constitutional: She is oriented to person, place, and time. She appears well-developed and well-nourished. No distress.  HENT:  Head: Normocephalic and atraumatic.  Eyes: Conjunctivae are normal. Right eye exhibits no discharge. Left eye exhibits no discharge. No scleral icterus.  Neck: Normal range of motion.  Respiratory: Effort normal. No respiratory distress.  GI: Soft. She exhibits no distension. There is no tenderness. There is no rebound and no guarding.  Genitourinary: Uterus is enlarged. Cervix exhibits no motion tenderness and no friability. Right adnexum displays no mass and no tenderness. Left adnexum displays no mass and no tenderness.  Genitourinary Comments: Minimal amount of brown discharge. Cervix closed.   Neurological: She is alert and oriented to person, place, and time.  Skin: Skin is warm and dry. She  is not diaphoretic.  Psychiatric: She has a normal mood and affect. Her behavior is normal. Judgment and thought content normal.    MAU Course  Procedures Results for orders placed or performed during the hospital encounter of 07/05/16 (from the past 24 hour(s))  Urinalysis, Routine w reflex microscopic (not at [facility])     Status: Abnormal   Collection Time: 07/05/16  5:45 PM  Result Value Ref Range   Color, Urine YELLOW YELLOW   APPearance CLEAR CLEAR   Specific Gravity, Urine <1.005 (L) 1.005 - 1.030   pH 6.0 5.0 - 8.0   Glucose, UA NEGATIVE NEGATIVE mg/dL   Hgb urine dipstick LARGE (A) NEGATIVE    Bilirubin Urine NEGATIVE NEGATIVE   Ketones, ur NEGATIVE NEGATIVE mg/dL   Protein, ur NEGATIVE NEGATIVE mg/dL   Nitrite NEGATIVE NEGATIVE   Leukocytes, UA NEGATIVE NEGATIVE  Urine microscopic-add on     Status: Abnormal   Collection Time: 07/05/16  5:45 PM  Result Value Ref Range   Squamous Epithelial / LPF 0-5 (A) NONE SEEN   WBC, UA NONE SEEN 0 - 5 WBC/hpf   RBC / HPF 0-5 0 - 5 RBC/hpf   Bacteria, UA NONE SEEN NONE SEEN  Pregnancy, urine POC     Status: Abnormal   Collection Time: 07/05/16  6:09 PM  Result Value Ref Range   Preg Test, Ur POSITIVE (A) NEGATIVE  CBC     Status: None   Collection Time: 07/05/16  6:12 PM  Result Value Ref Range   WBC 8.7 4.0 - 10.5 K/uL   RBC 4.43 3.87 - 5.11 MIL/uL   Hemoglobin 13.1 12.0 - 15.0 g/dL   HCT 16.1 09.6 - 04.5 %   MCV 84.7 78.0 - 100.0 fL   MCH 29.6 26.0 - 34.0 pg   MCHC 34.9 30.0 - 36.0 g/dL   RDW 40.9 81.1 - 91.4 %   Platelets 254 150 - 400 K/uL  hCG, quantitative, pregnancy     Status: Abnormal   Collection Time: 07/05/16  6:12 PM  Result Value Ref Range   hCG, Beta Chain, Quant, S 74,021 (H) <5 mIU/mL  HIV antibody     Status: None   Collection Time: 07/05/16  6:12 PM  Result Value Ref Range   HIV Screen 4th Generation wRfx Non Reactive Non Reactive  Wet prep, genital     Status: Abnormal   Collection Time: 07/05/16  6:20 PM  Result Value Ref Range   Yeast Wet Prep HPF POC NONE SEEN NONE SEEN   Trich, Wet Prep NONE SEEN NONE SEEN   Clue Cells Wet Prep HPF POC NONE SEEN NONE SEEN   WBC, Wet Prep HPF POC FEW (A) NONE SEEN   Sperm NONE SEEN    US Ob Comp Less 14 Wks  Result Date: 07/05/2016 CLINICAL DATA:  Vaginal bleeding EXAM: OBSTETRIC <14 WK [place] AND TRANSVAGINAL OB US TECHNIQUE: Both transabdominal and transvaginal ultrasound examinations were performed for complete evaluation of the gestation as well as the maternal uterus, adnexal regions, and pelvic cul-de-sac. Transvaginal technique was performed to assess early  pregnancy. COMPARISON:  None. FINDINGS: Intrauterine gestational sac: Visualized Yolk sac:  Visualized Embryo:  Visualized Cardiac Activity: Not visualized CRL:  3  mm   5 w   6 d Subchorionic hemorrhage:  None visualized. Maternal uterus/adnexae: No intrauterine mass. Cervical os closed. No extrauterine pelvic or adnexal mass. There is trace free pelvic fluid. IMPRESSION: A fetal pole is visualized without demonstrable fetal heart  activity. Crown-rump length measures 3 mm corresponding to an approximately [redacted] weeks gestation. Findings are suspicious but not yet definitive for failed pregnancy. Recommend follow-up US in 10-14 days for definitive diagnosis. This recommendation follows SRU consensus guidelines: Diagnostic Criteria for Nonviable Pregnancy Early in the First Trimester. Malva Limes Engl J Med 2013; 161:0960-45; 369:1443-51. Study otherwise unremarkable. Trace free pelvic fluid may be physiologic. Electronically Signed   By: Bretta BangWilliam  Woodruff III M.D.   On: 07/05/2016 19:28   [place] Ob Transvaginal  Result Date: 07/05/2016 CLINICAL DATA:  Vaginal bleeding EXAM: OBSTETRIC <14 WK US AND TRANSVAGINAL OB US TECHNIQUE: Both transabdominal and transvaginal ultrasound examinations were performed for complete evaluation of the gestation as well as the maternal uterus, adnexal regions, and pelvic cul-de-sac. Transvaginal technique was performed to assess early pregnancy. COMPARISON:  None. FINDINGS: Intrauterine gestational sac: Visualized Yolk sac:  Visualized Embryo:  Visualized Cardiac Activity: Not visualized CRL:  3  mm   5 w   6 d Subchorionic hemorrhage:  None visualized. Maternal uterus/adnexae: No intrauterine mass. Cervical os closed. No extrauterine pelvic or adnexal mass. There is trace free pelvic fluid. IMPRESSION: A fetal pole is visualized without demonstrable fetal heart activity. Crown-rump length measures 3 mm corresponding to an approximately [redacted] weeks gestation. Findings are suspicious but not yet definitive for failed  pregnancy. Recommend follow-up US in 10-14 days for definitive diagnosis. This recommendation follows SRU consensus guidelines: Diagnostic Criteria for Nonviable Pregnancy Early in the First Trimester. Malva Limes Engl J Med 2013; 409:8119-14; 369:1443-51. Study otherwise unremarkable. Trace free pelvic fluid may be physiologic. Electronically Signed   By: Bretta BangWilliam  Woodruff III M.D.   On: 07/05/2016 19:28     MDM +UPT UA, wet prep, GC/chlamydia, CBC, ABO/Rh, quant hCG, HIV, and US today to rule out ectopic pregnancy B positive Ultrasound shows SIUP measuring 8828w0d (3mm) with no cardiac activity --not yet definitive for failed pregnancy  Assessment and Plan  A: 1. Threatened miscarriage   2. Vaginal bleeding in pregnancy, first trimester    P: Discharge home Pelvic rest [facility] Discussed reasons to return to MAU  Judeth HornErin Marieclaire Bettenhausen 07/05/2016, 6:10 PM

## 2016-07-05 NOTE — MAU Note (Signed)
Patient presents with c/o vaginal spotting that started at 1600. Patient denies any pain.

## 2016-07-06 LAB — HIV ANTIBODY (ROUTINE TESTING W REFLEX): HIV Screen 4th Generation wRfx: NONREACTIVE

## 2016-07-07 LAB — GC/CHLAMYDIA PROBE AMP (~~LOC~~) NOT AT ARMC
Chlamydia: NEGATIVE
Neisseria Gonorrhea: NEGATIVE

## 2016-07-08 ENCOUNTER — Ambulatory Visit (INDEPENDENT_AMBULATORY_CARE_PROVIDER_SITE_OTHER): Payer: 59 | Admitting: Obstetrics and Gynecology

## 2016-07-08 VITALS — BP 121/82 | HR 93 | Wt 181.0 lb

## 2016-07-08 DIAGNOSIS — D6859 Other primary thrombophilia: Secondary | ICD-10-CM

## 2016-07-08 DIAGNOSIS — Z87898 Personal history of other specified conditions: Secondary | ICD-10-CM | POA: Insufficient documentation

## 2016-07-08 DIAGNOSIS — O200 Threatened abortion: Secondary | ICD-10-CM

## 2016-07-08 NOTE — Progress Notes (Signed)
Obstetrics and Gynecology Visit Problem Visit Evaluation  Appointment Date: 07/08/2016  [facility]  Primary Care Provider: Eustaquio BoydenJavier Horn  Chief Complaint:  Chief Complaint  Patient presents with  . Initial Prenatal Visit  Threatened abortion  History of Present Illness: [name] is a 40 y.o. African-American [phone] (Patient's last menstrual period was 05/07/2016.), seen for the above chief complaint. Her past medical history is significant for AMA, protein C deficiency, 25wk iatrogenic preterm birth (c-section) for IUGR with abnormal dopplers, h/o classical c-section  Patient had NOB with us today already scheduled but had to go to the MAU on 11/14 for VB. Bleeding described as small amount of dark red blood on the panty liner with continued but decreased to small amount of brown spotting; brown d/c seen on exam. Pt is B pos and beta hcg 74k with FP 3mm and no cardiac motion seen on u/s.  Pt with no VB, spotting or pain since seen in the MAU.    Review of Systems: Her 12 point review of systems is negative or as noted in the History of Present Illness.  Past Medical History:  Past Medical History:  Diagnosis Date  . Abnormal Pap smear    cryo age 70;colposcopy 2013  . History of asthma    childhood  . History of diverticulitis of colon 11/2012  . Protein C deficiency [facility](HCC)     Past Surgical History:  Past Surgical History:  Procedure Laterality Date  . CESAREAN SECTION  06/08/00  . CESAREAN SECTION  11/05/04  . COLONOSCOPY  12/2012   mod diverticulosis Arlyce Dice([place])  . COLPOSCOPY  2013  . GYNECOLOGIC CRYOSURGERY  1999    Past Obstetrical History:  OB History  Gravida Para Term Preterm AB Living  4 2 1 1 1 1   SAB TAB Ectopic Multiple Live Births    1     2    # Outcome Date GA Lbr Len/2nd Weight Sex Delivery Anes PTL Lv  4 Current           3 TAB 03/06/15 1858w4d    TAB     2 Term 11/05/04 4277w0d  6 lb 12 oz (3.062 kg)  F CS-Unspec   LIV  1 Preterm 06/08/00 6270w0d  14.9 oz (0.423 kg) F CS-Classical Spinal  DEC      Past Gynecological History: As per HPI.  Social History:  Social History   Social History  . Marital status: Divorced    Spouse name: N/A  . Number of children: 1  . Years of education: N/A   Occupational History  . customer service [employer]   Social History Main Topics  . Smoking status: Never Smoker  . Smokeless tobacco: Never Used  . Alcohol use Yes     Comment: social  . Drug use: No  . Sexual activity: Not Currently    Partners: Male    Birth control/ protection: Abstinence, Condom   Other Topics Concern  . Not on file   Social History Narrative   Lives with 1 daughter (2006)   Occupation: customer service   Edu: some college   Activity: walks some   Diet: good water, fruits/vegetables daily    Family History:  Family History  Problem Relation Age of Onset  . Hypertension Mother   . Hypertension Father   . Diabetes Father   . Cancer Maternal Aunt 50    breast/colon  . Colon cancer Maternal Aunt   .  Stroke Paternal Grandmother   . Prostate cancer Paternal Grandfather   . Pulmonary embolism Cousin 28    pulm embolism    Medications Ms. Horn had no medications administered during this visit. Current Outpatient Prescriptions  Medication Sig Dispense Refill  . diphenhydrAMINE (BENADRYL) 25 MG tablet Take 25 mg by mouth every 6 (six) hours as needed for allergies.    . Prenatal Vit-Fe Fumarate-FA (PRENATAL MULTIVITAMIN) TABS tablet Take 1 tablet by mouth daily at 12 noon.     No current facility-administered medications for this visit.     Allergies Other   Physical Exam:  BP 121/82   Pulse 93   Wt 181 lb (82.1 kg)   LMP 05/07/2016   BMI 30.12 kg/m  Body mass index is 30.12 kg/m. General appearance: Well nourished, well developed female in no acute distress.  Neuro/Psych:  Normal mood and affect.  Skin:  Warm and dry.   Laboratory: as  above  Radiology: as above  Assessment: pt stable  Plan:  ER precautions given. Rpt u/s in two weeks for viability and will tenatively schedule 3wk NOB.d/w pt that she is at high risk for SAB given AMA and episode of bleeding.   Pt denies any history blood clots. She states that in her other pregnancies she was put on lovenox around 12wks. If viability confirmed, can d/w re: anticoagulation. Per ACOG PB 138 04/2012, protein c deficiency is a low risk thrombophilia and with no personal h/o VTE, she can do surveillance w/o anticoagulation  Orders Placed This Encounter  Procedures  . US MFM OB Transvaginal    RTC 3wks for ROB  Cornelia Copaharlie Jadian Karman, Jr MD Attending Center for [facility](Faculty Practice)

## 2016-07-18 ENCOUNTER — Encounter (HOSPITAL_COMMUNITY): Payer: Self-pay | Admitting: Obstetrics and Gynecology

## 2016-07-21 ENCOUNTER — Other Ambulatory Visit: Payer: Self-pay | Admitting: Obstetrics and Gynecology

## 2016-07-21 DIAGNOSIS — O200 Threatened abortion: Secondary | ICD-10-CM

## 2016-07-21 DIAGNOSIS — O3680X0 Pregnancy with inconclusive fetal viability, not applicable or unspecified: Secondary | ICD-10-CM

## 2016-07-22 ENCOUNTER — Ambulatory Visit (HOSPITAL_COMMUNITY): Admission: RE | Admit: 2016-07-22 | Payer: 59 | Source: Ambulatory Visit

## 2016-07-22 ENCOUNTER — Ambulatory Visit (HOSPITAL_COMMUNITY)
Admission: RE | Admit: 2016-07-22 | Discharge: 2016-07-22 | Disposition: A | Discharge status: Home / Self Care | Payer: 59 | Source: Ambulatory Visit | Attending: Obstetrics and Gynecology | Admitting: Obstetrics and Gynecology

## 2016-07-22 ENCOUNTER — Telehealth: Payer: Self-pay | Admitting: *Deleted

## 2016-07-22 DIAGNOSIS — O021 Missed abortion: Secondary | ICD-10-CM

## 2016-07-22 DIAGNOSIS — O3680X0 Pregnancy with inconclusive fetal viability, not applicable or unspecified: Secondary | ICD-10-CM

## 2016-07-22 DIAGNOSIS — Z3A01 Less than 8 weeks gestation of pregnancy: Secondary | ICD-10-CM | POA: Insufficient documentation

## 2016-07-22 DIAGNOSIS — N8311 Corpus luteum cyst of right ovary: Secondary | ICD-10-CM | POA: Insufficient documentation

## 2016-07-22 DIAGNOSIS — O3481 Maternal care for other abnormalities of pelvic organs, first trimester: Secondary | ICD-10-CM | POA: Diagnosis not present

## 2016-07-22 DIAGNOSIS — O200 Threatened abortion: Secondary | ICD-10-CM | POA: Insufficient documentation

## 2016-07-22 MED ORDER — MISOPROSTOL 200 MCG PO TABS: 800.0000 ug | ORAL_TABLET | Freq: Once | ORAL | 0 refills | Status: DC

## 2016-07-22 NOTE — Telephone Encounter (Signed)
Informed pt of US result with no FHR noted suggesting nonviable pregnancy.  Pt denies any bleeding at this time, informed pt that she could opt to wait and see if things would pass on their own or we could send in Cytotec to assist.  Pt would like to proceed with Cytotec.  Sent medication to pharmacy per Dr Marice Potterove order and schedule follow-up appt for 08-17-16.

## 2016-07-29 ENCOUNTER — Encounter: Payer: 59 | Admitting: Obstetrics and Gynecology

## 2016-08-03 ENCOUNTER — Other Ambulatory Visit: Payer: Self-pay | Admitting: Family Medicine

## 2016-08-03 DIAGNOSIS — O021 Missed abortion: Secondary | ICD-10-CM

## 2016-08-03 MED ORDER — MISOPROSTOL 200 MCG PO TABS: 800.0000 ug | ORAL_TABLET | Freq: Once | ORAL | 0 refills | Status: DC

## 2016-08-04 ENCOUNTER — Telehealth: Payer: Self-pay | Admitting: *Deleted

## 2016-08-04 ENCOUNTER — Encounter (HOSPITAL_COMMUNITY): Payer: Self-pay | Admitting: Anesthesiology

## 2016-08-04 ENCOUNTER — Encounter (HOSPITAL_COMMUNITY): Payer: Self-pay | Admitting: *Deleted

## 2016-08-04 NOTE — Telephone Encounter (Signed)
Spoke to pt, states she never took the Cytotec due to the uterine rupture warning, informed pt that the chances of uterine rupture are very low but they have to put the warning on the medication.  Pt would rather not take the medication.  Is having very light brown spotting occasionally and would like to proceed with D&C.  [name] will schedule and notify pt of date and time.

## 2016-08-05 ENCOUNTER — Encounter (HOSPITAL_COMMUNITY)
Admission: RE | Disposition: A | Discharge status: Home / Self Care | Payer: Self-pay | Source: Ambulatory Visit | Attending: Obstetrics and Gynecology

## 2016-08-05 ENCOUNTER — Ambulatory Visit (HOSPITAL_COMMUNITY)
Admission: RE | Admit: 2016-08-05 | Discharge: 2016-08-05 | Disposition: A | Discharge status: Home / Self Care | Payer: 59 | Source: Ambulatory Visit | Attending: Obstetrics and Gynecology | Admitting: Obstetrics and Gynecology

## 2016-08-05 ENCOUNTER — Encounter (HOSPITAL_COMMUNITY): Payer: Self-pay | Admitting: *Deleted

## 2016-08-05 DIAGNOSIS — O09521 Supervision of elderly multigravida, first trimester: Secondary | ICD-10-CM | POA: Diagnosis not present

## 2016-08-05 DIAGNOSIS — Z3A01 Less than 8 weeks gestation of pregnancy: Secondary | ICD-10-CM | POA: Diagnosis not present

## 2016-08-05 DIAGNOSIS — O200 Threatened abortion: Secondary | ICD-10-CM | POA: Diagnosis present

## 2016-08-05 LAB — CBC
HCT: 38.3 % (ref 36.0–46.0)
Hemoglobin: 13.6 g/dL (ref 12.0–15.0)
MCH: 30.5 pg (ref 26.0–34.0)
MCHC: 35.5 g/dL (ref 30.0–36.0)
MCV: 85.9 fL (ref 78.0–100.0)
Platelets: 247 K/uL (ref 150–400)
RBC: 4.46 MIL/uL (ref 3.87–5.11)
RDW: 13.6 % (ref 11.5–15.5)
WBC: 6.3 K/uL (ref 4.0–10.5)

## 2016-08-05 SURGERY — DILATION AND EVACUATION, UTERUS
Anesthesia: Choice

## 2016-08-05 MED ORDER — SCOPOLAMINE 1 MG/3DAYS TD PT72
1.0000 | MEDICATED_PATCH | Freq: Once | TRANSDERMAL | Status: DC
  Administered 2016-08-05: 1.5 mg via TRANSDERMAL

## 2016-08-05 MED ORDER — SCOPOLAMINE 1 MG/3DAYS TD PT72
MEDICATED_PATCH | TRANSDERMAL | Status: AC
  Administered 2016-08-05: 1.5 mg via TRANSDERMAL
  Filled 2016-08-05: qty 1

## 2016-08-05 MED ORDER — LACTATED RINGERS IV SOLN
INTRAVENOUS | Status: DC
  Administered 2016-08-05: 13:00:00 via INTRAVENOUS

## 2016-08-05 NOTE — Anesthesia Preprocedure Evaluation (Deleted)
Anesthesia Evaluation  Patient identified by MRN, date of birth, ID band Patient awake    Reviewed: Allergy & Precautions, NPO status , Patient's Chart, lab work & pertinent test results  Airway Mallampati: II  TM Distance: >3 FB Neck ROM: Full    Dental no notable dental hx. (+) Dental Advisory Given   Pulmonary asthma ,    Pulmonary exam normal        Cardiovascular negative cardio ROS Normal cardiovascular exam     Neuro/Psych negative neurological ROS  negative psych ROS   GI/Hepatic negative GI ROS, Neg liver ROS,   Endo/Other  negative endocrine ROS  Renal/GU negative Renal ROS  negative genitourinary   Musculoskeletal negative musculoskeletal ROS (+)   Abdominal   Peds negative pediatric ROS (+)  Hematology negative hematology ROS (+)   Anesthesia Other Findings   Reproductive/Obstetrics                             Anesthesia Physical Anesthesia Plan  ASA: II  Anesthesia Plan: General   Post-op Pain Management:    Induction: Intravenous  Airway Management Planned: LMA  Additional Equipment:   Intra-op Plan:   Post-operative Plan: Extubation in OR  Informed Consent: I have reviewed the patients History and Physical, chart, labs and discussed the procedure including the risks, benefits and alternatives for the proposed anesthesia with the patient or authorized representative who has indicated his/her understanding and acceptance.   Dental advisory given  Plan Discussed with: CRNA and Anesthesiologist  Anesthesia Plan Comments:         Anesthesia Quick Evaluation

## 2016-08-05 NOTE — OR Nursing (Signed)
Patient decided with Dr Vergie LivingPickens to cancel today and return for a rescheduled procedure.Instructions given. Discharged for home ambulatory with finacee. Matilde BashN Rhylee Pucillo, RN

## 2016-08-05 NOTE — H&P (Signed)
Pre-Operative History & Physical  08/05/2016 - 2:22 PM [facility]: [facility]  Chief Complaint: threatened abortion  History of Present Illness  40 y.o. [id number] @ 6wks by MSD on 12/1 u/s, with the above CC. Pregnancy complicated by: AMA, protein c deficiency, h/o c-section x 1, BMI 30  Patient diagnosed with non viable IUP on 1/21 at approximately 6wk 5d by MSD and options d/w pt and she first wanted to do medical management and then changed her mind to do surgery yesterday. Just some brown spotting since dx with non viable IUP and no fevers, chills, chest pain, sob, nausea, vomiting, abdominal pain.   Review of Systems:  as noted in the History of Present Illness.  PMHx:  Past Medical History:  Diagnosis Date  . Abnormal Pap smear    cryo age 30;colposcopy 2013  . Asthma    CHILDHOOD  . History of asthma    childhood  . History of diverticulitis of colon 11/2012  . Protein C deficiency (HCC)    PSHx:  Past Surgical History:  Procedure Laterality Date  . CESAREAN SECTION  06/08/00  . CESAREAN SECTION  11/05/04  . COLONOSCOPY  12/2012   mod diverticulosis Arlyce Dice([place])  . COLPOSCOPY  2013  . GYNECOLOGIC CRYOSURGERY  1999   Medications:  Prescriptions Prior to Admission  Medication Sig Dispense Refill Last Dose  . ibuprofen (ADVIL,MOTRIN) 200 MG tablet Take 400 mg by mouth every 6 (six) hours as needed for headache or moderate pain.   08/03/2016  . Prenatal Vit-Fe Fumarate-FA (PRENATAL MULTIVITAMIN) TABS tablet Take 1 tablet by mouth daily at 12 noon.   08/04/2016 at Unknown time  . cetirizine-pseudoephedrine (ZYRTEC-D) 5-120 MG tablet Take 1 tablet by mouth daily as needed for allergies.   Unknown at Unknown time  . diphenhydrAMINE (BENADRYL) 25 MG tablet Take 25 mg by mouth every 6 (six) hours as needed for allergies.   Unknown at Unknown time  . misoprostol (CYTOTEC) 200 MCG tablet Place 4 tablets (800 mcg total) vaginally once. (Patient not taking: Reported on  08/04/2016) 4 tablet 0 Not Taking at Unknown time     Allergies: is allergic to other. OBHx:  OB History  Gravida Para Term Preterm AB Living  4 2 1 1 1 1   SAB TAB Ectopic Multiple Live Births    1     2    # Outcome Date GA Lbr Len/2nd Weight Sex Delivery Anes PTL Lv  4 Current           3 TAB 03/06/15 1787w4d    TAB     2 Term 11/05/04 5357w0d  3.062 kg (6 lb 12 oz) F CS-Unspec   LIV  1 Preterm 06/08/00 1218w0d  0.423 kg (14.9 oz) F CS-Classical Spinal  DEC     GYNHx:  As above           FHx:  Family History  Problem Relation Age of Onset  . Hypertension Mother   . Hypertension Father   . Diabetes Father   . Cancer Maternal Aunt 50    breast/colon  . Colon cancer Maternal Aunt   . Stroke Paternal Grandmother   . Prostate cancer Paternal Grandfather   . Pulmonary embolism Cousin 28    pulm embolism   Soc Hx:  Social History   Social History  . Marital status: Divorced    Spouse name: N/A  . Number of children: 1  . Years of education: N/A   Occupational History  .  customer service [employer]   Social History Main Topics  . Smoking status: Never Smoker  . Smokeless tobacco: Never Used  . Alcohol use Yes     Comment: social  . Drug use: No  . Sexual activity: Not Currently    Partners: Male    Birth control/ protection: Abstinence, Condom   Other Topics Concern  . Not on file   Social History Narrative   Lives with 1 daughter (2006)   Occupation: Clinical biochemistcustomer service   Edu: some college   Activity: walks some   Diet: good water, fruits/vegetables daily    Objective    Current Vital Signs 24h Vital Sign Ranges  T 98.3 F (36.8 C) Temp  Avg: 98.3 F (36.8 C)  Min: 98.3 F (36.8 C)  Max: 98.3 F (36.8 C)  BP 131/89 BP  Min: 131/89  Max: 131/89  HR 80 Pulse  Avg: 80  Min: 80  Max: 80  RR 18 Resp  Avg: 18  Min: 18  Max: 18  SaO2 100 % Not Delivered SpO2  Avg: 100 %  Min: 100 %  Max: 100 %       24 Hour I/O Current Shift I/O  Time Ins Outs No  intake/output data recorded. No intake/output data recorded.   General: Well nourished, well developed female in no acute distress.  Skin:  Warm and dry.  Cardiovascular: S1, S2 normal, no murmur, rub or gallop, regular rate and rhythm Respiratory:  Clear to auscultation bilateral. Normal respiratory effort Abdomen: obese, nttp, nd Neuro/Psych:  Normal mood and affect.   Labs  B pos  Recent Labs Lab 08/05/16 1250  WBC 6.3  HGB 13.6  HCT 38.3  PLT 247    Radiology As above  Assessment & Plan   40 y.o. [id number] @ 4075w6d with non viable IUP. Pt stable Unfortunately, I only found out about the procedure this morning. I went to talk to her about it and she was wondering about getting a BTL, too. I d/w her r/b/a and permanency and additional surgery involved with it and she would like to do it. Unfortunately we didn't get approval for it (she has Nurse, learning disabilitycommercial insurance) so I talked to our schedulers and we will add her on my schedule for 12/22 for suction d&c and l/s BTL; pt states she's off on fridays anyway and would like to have it done then. ER precautions given. I also d/w her re: most likely reason for SAB is AMA and chromosomal abnormalities and I didn't recommend doing sending the POCs to genetics.   Cornelia Copaharlie Tamarius Rosenfield, Jr. MD Attending Center for [facility](Faculty Practice)

## 2016-08-05 NOTE — Discharge Instructions (Signed)
Miscarriage What are the signs or symptoms?  Vaginal bleeding or spotting, with or without cramps or pain.  Pain or cramping in the abdomen or lower back.  Passing fluid, tissue, or blood clots from the vagina. Follow these instructions at home:  Your caregiver may order bed rest or may allow you to continue light activity. Resume activity as directed by your caregiver.  Have someone help with home and family responsibilities during this time.  Keep track of the number of sanitary pads you use each day and how soaked (saturated) they are. Write down this information.  Do not use tampons. Do not douche or have sexual intercourse until approved by your caregiver.  Only take over-the-counter or prescription medicines for pain or discomfort as directed by your caregiver.  Do not take aspirin. Aspirin can cause bleeding.  Keep all follow-up appointments with your caregiver.  If you or your partner have problems with grieving, talk to your caregiver or seek counseling to help cope with the pregnancy loss. Allow enough time to grieve before trying to get pregnant again. Get help right away if:  You have severe cramps or pain in your back or abdomen.  You have a fever.  You pass large blood clots (walnut-sized or larger) ortissue from your vagina. Save any tissue for your caregiver to inspect.  Your bleeding increases.  You have a thick, bad-smelling vaginal discharge.  You become lightheaded, weak, or you faint.  You have chills. This information is not intended to replace advice given to you by your health care provider. Make sure you discuss any questions you have with your health care provider. Document Released: 02/01/2001 Document Revised: 01/14/2016 Document Reviewed: 09/27/2011 Elsevier Interactive Patient Education  2017 [employer]

## 2016-08-10 ENCOUNTER — Inpatient Hospital Stay (HOSPITAL_COMMUNITY)
Admission: AD | Admit: 2016-08-10 | Discharge: 2016-08-10 | Disposition: A | Discharge status: Home / Self Care | Payer: 59 | Source: Ambulatory Visit | Attending: Family Medicine | Admitting: Family Medicine

## 2016-08-10 ENCOUNTER — Telehealth: Payer: Self-pay | Admitting: *Deleted

## 2016-08-10 ENCOUNTER — Inpatient Hospital Stay (HOSPITAL_COMMUNITY): Payer: 59

## 2016-08-10 ENCOUNTER — Encounter (HOSPITAL_COMMUNITY): Payer: Self-pay | Admitting: *Deleted

## 2016-08-10 DIAGNOSIS — O039 Complete or unspecified spontaneous abortion without complication: Secondary | ICD-10-CM

## 2016-08-10 DIAGNOSIS — R109 Unspecified abdominal pain: Secondary | ICD-10-CM | POA: Diagnosis present

## 2016-08-10 LAB — CBC
HEMATOCRIT: 37.9 % (ref 36.0–46.0)
Hemoglobin: 13.5 g/dL (ref 12.0–15.0)
MCH: 30.5 pg (ref 26.0–34.0)
MCHC: 35.6 g/dL (ref 30.0–36.0)
MCV: 85.7 fL (ref 78.0–100.0)
Platelets: 231 10*3/uL (ref 150–400)
RBC: 4.42 MIL/uL (ref 3.87–5.11)
RDW: 13.4 % (ref 11.5–15.5)
WBC: 12.7 10*3/uL — ABNORMAL HIGH (ref 4.0–10.5)

## 2016-08-10 MED ORDER — IBUPROFEN 800 MG PO TABS: 800.0000 mg | ORAL_TABLET | Freq: Three times a day (TID) | ORAL | 0 refills | Status: DC | PRN

## 2016-08-10 MED ORDER — HYDROMORPHONE HCL 1 MG/ML IJ SOLN
1.0000 mg | Freq: Once | INTRAMUSCULAR | Status: AC
  Administered 2016-08-10: 1 mg via INTRAMUSCULAR
  Filled 2016-08-10: qty 1

## 2016-08-10 MED ORDER — IBUPROFEN 600 MG PO TABS: 600.0000 mg | ORAL_TABLET | Freq: Once | ORAL | Status: DC

## 2016-08-10 MED ORDER — OXYCODONE-ACETAMINOPHEN 5-325 MG PO TABS: 1.0000 | ORAL_TABLET | Freq: Once | ORAL | Status: DC

## 2016-08-10 MED ORDER — MISOPROSTOL 200 MCG PO TABS: 600.0000 ug | ORAL_TABLET | Freq: Once | ORAL | Status: DC

## 2016-08-10 MED ORDER — MISOPROSTOL 200 MCG PO TABS
800.0000 ug | ORAL_TABLET | Freq: Once | ORAL | Status: AC
  Administered 2016-08-10: 800 ug via ORAL
  Filled 2016-08-10: qty 4

## 2016-08-10 MED ORDER — MISOPROSTOL 200 MCG PO TABS: 800.0000 ug | ORAL_TABLET | Freq: Once | ORAL | 0 refills | Status: DC

## 2016-08-10 MED ORDER — OXYCODONE-ACETAMINOPHEN 5-325 MG PO TABS: 2.0000 | ORAL_TABLET | ORAL | 0 refills | Status: DC | PRN

## 2016-08-10 NOTE — MAU Provider Note (Signed)
History     CSN: [id number]  Arrival date and time: 08/10/16 1526   First Provider Initiated Contact with Patient 08/10/16 1758      Chief Complaint  Patient presents with  . Abdominal Pain  . Vaginal Bleeding   HPI   [name] is a 40 y.o. female [phone] here in MAU with vaginal bleeding and abdominal pain. She has a known failed pregnancy and was scheduled for a D&C and tubal ligation on Friday 12/22.   Patient diagnosed with non viable IUP on 1/21 at approximately 6wk 5d by MSD Last night she passed a large gestational sac and called this morning to notify her Dr. It was recommended that she come into MAU to have an US to evaluate if everything had passed. Her D&C was canceled for Friday.   After she passed the sac she continued to have heavy bleeding and passing clots. Currently her bleeding is menstrual like. Her pain is still there which comes and goes.   OB History    Gravida Para Term Preterm AB Living   4 2 1 1 1 1    SAB TAB Ectopic Multiple Live Births     1     2      Past Medical History:  Diagnosis Date  . Abnormal Pap smear    cryo age 67;colposcopy 2013  . Asthma    CHILDHOOD  . History of asthma    childhood  . History of diverticulitis of colon 11/2012  . Protein C deficiency [facility](HCC)     Past Surgical History:  Procedure Laterality Date  . CESAREAN SECTION  06/08/00  . CESAREAN SECTION  11/05/04  . COLONOSCOPY  12/2012   mod diverticulosis Arlyce Dice([facility])  . COLPOSCOPY  2013  . GYNECOLOGIC CRYOSURGERY  1999    Family History  Problem Relation Age of Onset  . Hypertension Mother   . Hypertension Father   . Diabetes Father   . Cancer Maternal Aunt 50    breast/colon  . Colon cancer Maternal Aunt   . Stroke Paternal Grandmother   . Prostate cancer Paternal Grandfather   . Pulmonary embolism Cousin 28    pulm embolism    Social History  Substance Use Topics  . Smoking status: Never Smoker  . Smokeless tobacco: Never Used  . Alcohol use  Yes     Comment: social    Allergies:  Allergies  Allergen Reactions  . Other Hives    Eggplant, pecans, corn, walnuts, tobacco    Prescriptions Prior to Admission  Medication Sig Dispense Refill Last Dose  . cetirizine-pseudoephedrine (ZYRTEC-D) 5-120 MG tablet Take 1 tablet by mouth daily as needed for allergies.   08/09/2016 at Unknown time  . ibuprofen (ADVIL,MOTRIN) 200 MG tablet Take 400 mg by mouth every 6 (six) hours as needed for headache or moderate pain.   08/10/2016 at Unknown time  . diphenhydrAMINE (BENADRYL) 25 MG tablet Take 25 mg by mouth every 6 (six) hours as needed for allergies.   Not Taking at Unknown time   Results for orders placed or performed during the hospital encounter of 08/10/16 (from the past 48 hour(s))  CBC     Status: Abnormal   Collection Time: 08/10/16  6:39 PM  Result Value Ref Range   WBC 12.7 (H) 4.0 - 10.5 K/uL   RBC 4.42 3.87 - 5.11 MIL/uL   Hemoglobin 13.5 12.0 - 15.0 g/dL   HCT 14.737.9 82.936.0 - 56.246.0 %   MCV 85.7 78.0 -  100.0 fL   MCH 30.5 26.0 - 34.0 pg   MCHC 35.6 30.0 - 36.0 g/dL   RDW 16.113.4 09.611.5 - 04.515.5 %   Platelets 231 150 - 400 K/uL    [place] Ob Transvaginal  Result Date: 08/10/2016 CLINICAL DATA:  Follow-up nonviable pregnancy, heavy bleeding EXAM: TRANSVAGINAL OB ULTRASOUND TECHNIQUE: Transvaginal ultrasound was performed for complete evaluation of the gestation as well as the maternal uterus, adnexal regions, and pelvic cul-de-sac. COMPARISON:  07/22/2016 FINDINGS: Intrauterine gestational sac: None Subchorionic hemorrhage:  None visualized. Maternal uterus/adnexae: Endometrial complex is thickened/heterogeneous, measuring 24 mm. No focal color Doppler flow. Bilateral ovaries are within normal limits. No free fluid. IMPRESSION: No IUP is visualized. Endometrial complex is thickened/heterogeneous, measuring 24 mm. No focal color Doppler flow. Overall clinical appearance favors missed abortion with blood products in the endometrial cavity. No  findings to suggest vascularized products of conception. Correlate with beta HCG. If symptoms persist, consider repeat pelvic ultrasound in 7-10 days. Electronically Signed   By: Charline BillsSriyesh  Krishnan M.D.   On: 08/10/2016 17:23   Review of Systems  Constitutional: Negative for chills and fever.  Neurological: Negative for dizziness.   Physical Exam   Blood pressure 149/93, pulse 94, temperature 98 F (36.7 C), temperature source Oral, resp. rate 18, last menstrual period 05/07/2016.  Physical Exam  Constitutional: She is oriented to person, place, and time. She appears well-developed and well-nourished. No distress.  HENT:  Head: Normocephalic.  Eyes: Pupils are equal, round, and reactive to light.  GI: Soft. She exhibits no distension. There is no tenderness. There is no rebound.  Genitourinary:  Genitourinary Comments: Vagina - Large amount of clots, tissue and blood noted in the vaginal canal.  Cervix - ?Gestational sac/ products of conception adhered to cervix. Attempted to removed with ring forceps; unsuccessful. Active bleeding noted  Chaperone present for exam.   Musculoskeletal: Normal range of motion.  Neurological: She is alert and oriented to person, place, and time.  Skin: Skin is warm. She is not diaphoretic.  Psychiatric: Her behavior is normal.    MAU Course  Procedures  None  MDM  Dilaudid 1 mg IM US  CBC  Cytotec 800 mcg PO given. Will observe.  Report given to Thressa ShellerHeather Hogan CNM who resumes care of the patient.  2140: Pelvic exam repeated. Large amount of tissue removed from the cervical os. Some clot still seen at the os, but reassured the patient that this should pass on its own after the cytotec. Given repeat dose of cytotec to take at home if she feels she has not passed any clot within about 24 hours. Will have patient FU with [place] by Friday to make a plan going into the holiday weekend.   Assessment and Plan   1. SAB (spontaneous abortion)    DC  home Comfort measures reviewed   Bleeding precautions RX: ibuprofen 800mg  PRN, Oxycodone PRN, cytotec 800mcg buccally as directed.  Return to MAU as needed FU with OB as planned  Follow-up Information    [facility].   Specialty:  Obstetrics and Gynecology Contact information: [address] [place] [phone]

## 2016-08-10 NOTE — Discharge Instructions (Signed)

## 2016-08-10 NOTE — Telephone Encounter (Signed)
Pt called in stating she thinks she passed products of conception last night around 11pm. Approximately 2:00pm today she has increased abdominal pain and cramping. Spoke to attending, Dr. Alvester MorinNewton, who adv pt to report to MAU for eval. Pt expressed understanding.

## 2016-08-10 NOTE — MAU Note (Signed)
Pt states she thinks she passed the whole sac last night and the pain and bleeding stopped but it returned around 1445 today and she took 800mg  of ibuprofen right before 3.

## 2016-08-10 NOTE — Telephone Encounter (Signed)
-----   Message from Olevia BowensJacinda S Battle sent at 08/10/2016  9:53 AM EST ----- Regarding: Need Order Pt called and left a message on office voicemail statin she needed to cancel her D&C, because she had passed the baby, looked for her surgery, it looks like it was already cancelled; not sure by who  Dr. Vergie LivingPickens called the office asking about this patient and I told him the above message. He wants to schedule the patient for a transvag ultrasound (please place this under his name). He will follow up w/ patient via phone after the U/S

## 2016-08-10 NOTE — Telephone Encounter (Signed)
US order placed, will schedule.

## 2016-08-11 ENCOUNTER — Telehealth: Payer: Self-pay | Admitting: *Deleted

## 2016-08-11 NOTE — Telephone Encounter (Signed)
Pt called in stating she was seen at MAU yesterday and given Cytotec. She was instructed to take a repeat does of Cytotec at home which she did. She states she felt like she had "passed some blood clots this morning but nothing major". Advised pt to follow directions provided by MAU and call in for appt if needed tomorrow as directed by MAU.Bleeding precautions given. Pt expressed understanding.

## 2016-08-17 ENCOUNTER — Ambulatory Visit: Payer: 59 | Admitting: Obstetrics and Gynecology

## 2016-08-17 ENCOUNTER — Ambulatory Visit (HOSPITAL_COMMUNITY): Payer: 59

## 2016-08-18 ENCOUNTER — Encounter (INDEPENDENT_AMBULATORY_CARE_PROVIDER_SITE_OTHER): Payer: 59 | Admitting: *Deleted

## 2016-08-18 ENCOUNTER — Ambulatory Visit (INDEPENDENT_AMBULATORY_CARE_PROVIDER_SITE_OTHER): Payer: 59 | Admitting: Family Medicine

## 2016-08-18 ENCOUNTER — Encounter: Payer: Self-pay | Admitting: Family Medicine

## 2016-08-18 VITALS — BP 139/85 | HR 87 | Ht 65.0 in | Wt 181.0 lb

## 2016-08-18 DIAGNOSIS — O021 Missed abortion: Secondary | ICD-10-CM

## 2016-08-18 NOTE — Progress Notes (Signed)
Patient ID: [name], female   DOB: 05/10/1976, 40 y.o.   MRN: [id number]   CLINIC ENCOUNTER NOTE  History:  40 y.o. [id number] here today for follow up SAB. Seen in MAU on 12/20 for passage of POC, US confirmed no IUP. Given cytotec from MAU which she took. Reports passing clots the next day and since has had only minimal spotting  She denies any abnormal vaginal discharge, bleeding, pelvic pain or other concerns.   Past Medical History:  Diagnosis Date  . Abnormal Pap smear    cryo age 78;colposcopy 2013  . Asthma    CHILDHOOD  . History of asthma    childhood  . History of diverticulitis of colon 11/2012  . Protein C deficiency [facility](HCC)     Past Surgical History:  Procedure Laterality Date  . CESAREAN SECTION  06/08/00  . CESAREAN SECTION  11/05/04  . COLONOSCOPY  12/2012   mod diverticulosis Arlyce Dice([place])  . COLPOSCOPY  2013  . GYNECOLOGIC CRYOSURGERY  1999    The following portions of the patient's history were reviewed and updated as appropriate: allergies, current medications, past family history, past medical history, past social history, past surgical history and problem list.   Health Maintenance:  Normal pap and negative HRHPV on 02/17/2016.    Review of Systems:  Pertinent items noted in HPI and remainder of comprehensive ROS otherwise negative.   Objective:  Physical Exam BP 139/85   Pulse 87   Ht 5\' 5"  (1.651 m)   Wt 181 lb (82.1 kg)   LMP 05/07/2016   Breastfeeding? No   BMI 30.12 kg/m  CONSTITUTIONAL: Well-developed, well-nourished female in no acute distress.  HENT:  Normocephalic, atraumatic. External right and left ear normal. Oropharynx is clear and moist EYES: Conjunctivae and EOM are normal. Pupils are equal, round, and reactive to light. No scleral icterus.  NECK: Normal range of motion, supple, no masses SKIN: Skin is warm and dry. No rash noted. Not diaphoretic. No erythema. No pallor. NEUROLGIC: Alert and oriented to person, place, and time. Normal  reflexes, muscle tone coordination. No cranial nerve deficit noted. PSYCHIATRIC: Normal mood and affect. Normal behavior. Normal judgment and thought content. CARDIOVASCULAR: Normal heart rate noted RESPIRATORY: Effort and breath sounds normal, no problems with respiration noted ABDOMEN: Soft, no distention noted.   PELVIC: deferred MUSCULOSKELETAL: Normal range of motion. No edema noted.  Labs and Imaging [place] Ob Transvaginal  Result Date: 08/10/2016 CLINICAL DATA:  Follow-up nonviable pregnancy, heavy bleeding EXAM: TRANSVAGINAL OB ULTRASOUND TECHNIQUE: Transvaginal ultrasound was performed for complete evaluation of the gestation as well as the maternal uterus, adnexal regions, and pelvic cul-de-sac. COMPARISON:  07/22/2016 FINDINGS: Intrauterine gestational sac: None Subchorionic hemorrhage:  None visualized. Maternal uterus/adnexae: Endometrial complex is thickened/heterogeneous, measuring 24 mm. No focal color Doppler flow. Bilateral ovaries are within normal limits. No free fluid. IMPRESSION: No IUP is visualized. Endometrial complex is thickened/heterogeneous, measuring 24 mm. No focal color Doppler flow. Overall clinical appearance favors missed abortion with blood products in the endometrial cavity. No findings to suggest vascularized products of conception. Correlate with beta HCG. If symptoms persist, consider repeat pelvic ultrasound in 7-10 days. Electronically Signed   By: Charline BillsSriyesh  Krishnan M.D.   On: 08/10/2016 17:23   [place] Ob Transvaginal  Result Date: 07/22/2016 CLINICAL DATA:  Threatened abortion, assess fetal viability, follow-up, quantitative beta HCG = 74,021 on 07/05/2016 EXAM: TRANSVAGINAL OB ULTRASOUND TECHNIQUE: Transvaginal ultrasound was performed for complete evaluation of the gestation as well as the maternal  uterus, adnexal regions, and pelvic cul-de-sac. COMPARISON:  07/05/2016 FINDINGS: Intrauterine gestational sac: Present Yolk sac:  Questionable visualization of an  abnormal yolk sac Embryo:  None identified Cardiac Activity: Not identified Heart Rate: N/A bpm MSD: 18.1  mm   6 w   5  d Subchorionic hemorrhage: Very small subchronic hemorrhage identified. Maternal uterus/adnexae: Uterus otherwise unremarkable. RIGHT ovary 3.8 x 2.0 x 2.6 cm containing a small corpus luteal cyst. LEFT ovary normal size and morphology 36.7 x 2.9 x 2.1 cm. Internal blood flow present within both ovaries on color Doppler imaging. Trace free pelvic fluid. No adnexal masses. IMPRESSION: Gestational sac within the uterus questionably containing an abnormal yolk sac but without identification of a fetal pole. Nonvisualization of a fetal pole 2 weeks after visualization on a prior ultrasound exam is consistent with a nonviable pregnancy. Electronically Signed   By: Ulyses SouthwardMark  Boles M.D.   On: 07/22/2016 10:23    Assessment & Plan:   1. Missed abortion Completed miscarriage-- surgical pathology showed possible gestational sac and chorionic villi Provided emotional support Discussed return of fertility Patient does not desire another pregnancy is considering tubal ligation. Encouraged patient to discuss with insurance and scheduled pre op visit with one of my partners if this is what she desires Reviewed safety to start trying for pregnancy after next menses if desired   Routine preventative health maintenance measures emphasized. Please refer to After Visit Summary for other counseling recommendations.   Return pre-op for interval tubal ligation if desired.  Total face-to-face time with patient: 25 minutes. Over 50% of encounter was spent on counseling and coordination of care.

## 2016-08-18 NOTE — Progress Notes (Signed)
Pt here for follow up SAB. She states she still has some vaginal spotting and last passed clots/tissue on Friday, 6 days ago.

## 2016-09-16 ENCOUNTER — Ambulatory Visit (INDEPENDENT_AMBULATORY_CARE_PROVIDER_SITE_OTHER): Payer: 59 | Admitting: Obstetrics and Gynecology

## 2016-09-16 ENCOUNTER — Encounter: Payer: Self-pay | Admitting: Obstetrics and Gynecology

## 2016-09-16 DIAGNOSIS — Z87410 Personal history of cervical dysplasia: Secondary | ICD-10-CM | POA: Diagnosis not present

## 2016-09-16 NOTE — Progress Notes (Signed)
Obstetrics and Gynecology Visit Established Patient Evaluation  Appointment Date: 09/16/2016  OBGYN Clinic: [facility]  Primary Care Provider: Eustaquio Boyden  Referring Provider: Karyl Kinnier, MD  Chief Complaint:  Chief Complaint  Patient presents with  . BTL Consult    History of Present Illness: [name] is a 41 y.o. African-American [phone] (Patient's last menstrual period was 09/14/2016.), seen for the above chief complaint. Her past medical history is significant for protein c deficiency, h/o c-section x 2, BMI 30   Recent SAB and LMP just started. Patient denies any problems or questions.    Review of Systems: as noted in the History of Present Illness.  Past Medical History:  Past Medical History:  Diagnosis Date  . Abnormal Pap smear    cryo age 67;colposcopy 2013  . Asthma    CHILDHOOD  . History of asthma    childhood  . History of diverticulitis of colon 11/2012  . Protein C deficiency [facility])     Past Surgical History:  Past Surgical History:  Procedure Laterality Date  . CESAREAN SECTION  06/08/00  . CESAREAN SECTION  11/05/04  . COLONOSCOPY  12/2012   mod diverticulosis Arlyce Dice)  . COLPOSCOPY  2013  . GYNECOLOGIC CRYOSURGERY  1999    Past Obstetrical History:  OB History  Gravida Para Term Preterm AB Living  4 2 1 1 2 1   SAB TAB Ectopic Multiple Live Births  1 1 0 0 2    # Outcome Date GA Lbr Len/2nd Weight Sex Delivery Anes PTL Lv  4 SAB 08/18/16 [redacted]w[redacted]d    SAB     3 TAB 03/06/15 [redacted]w[redacted]d    TAB     2 Term 11/05/04 [redacted]w[redacted]d  6 lb 12 oz (3.062 kg) F CS-Unspec   LIV  1 Preterm 06/08/00 [redacted]w[redacted]d  14.9 oz (0.423 kg) F CS-Classical Spinal  DEC      Past Gynecological History: As per HPI. 04/2011 LGSIL pap followed by benign colposcopic biopsies and ECC 12/2011 LGSIL pap followed by benign colposcopic biopsies and ECC 12/2012 Normal pap and negative HRHPV 2016 NILM, +HR but 16/18 neg 01/2016 NILM, HPV neg  Social History:  Social History    Social History  . Marital status: Divorced    Spouse name: N/A  . Number of children: 1  . Years of education: N/A   Occupational History  . customer service [employer]   Social History Main Topics  . Smoking status: Never Smoker  . Smokeless tobacco: Never Used  . Alcohol use Yes     Comment: social  . Drug use: No  . Sexual activity: Yes    Partners: Male    Birth control/ protection: Condom   Other Topics Concern  . Not on file   Social History Narrative   Lives with 1 daughter (2006)   Occupation: customer service   Edu: some college   Activity: walks some   Diet: good water, fruits/vegetables daily    Family History:  Family History  Problem Relation Age of Onset  . Hypertension Mother   . Hypertension Father   . Diabetes Father   . Cancer Maternal Aunt 50    breast/colon  . Colon cancer Maternal Aunt   . Stroke Paternal Grandmother   . Prostate cancer Paternal Grandfather   . Pulmonary embolism Cousin 28    pulm embolism    Medications Ms. Clapp had no medications administered during this visit. Current Outpatient Prescriptions  Medication Sig  Dispense Refill  . ibuprofen (ADVIL,MOTRIN) 800 MG tablet Take 1 tablet (800 mg total) by mouth every 8 (eight) hours as needed. 30 tablet 0   No current facility-administered medications for this visit.     Allergies Other   Physical Exam:  BP 133/81 (BP Location: Left Arm, Patient Position: Sitting, Cuff Size: Normal)   Pulse 87   Resp 18   Ht 5\' 5"  (1.651 m)   Wt 181 lb (82.1 kg)   LMP 09/14/2016   BMI 30.12 kg/m  Body mass index is 30.12 kg/m. General appearance: Well nourished, well developed female in no acute distress.  Neck:  Supple, normal appearance, and no thyromegaly  Cardiovascular: normal s1 and s2.  No murmurs, rubs or gallops. Respiratory:  Clear to auscultation bilateral. Normal respiratory effort Abdomen: positive bowel sounds and no masses, hernias; diffusely non tender to  palpation, non distended Neuro/Psych:  Normal mood and affect.  Skin:  Warm and dry.   Laboratory: none  Radiology: none  Assessment: pt doing well  Plan: BC options d/w her and she'd like to do BTL. D/w her re: GET and filshie clips and possible increased risks of scar tissue increasing surgical risks and risk of regret and vasectomy option. Pt would like to do BTL. Request sent via in basket.  RTC after surgery.   Cornelia Copaharlie Mordecai Tindol, Jr MD Attending [facility](Faculty Practice)

## 2016-09-28 ENCOUNTER — Encounter (HOSPITAL_COMMUNITY): Payer: Self-pay

## 2016-10-19 ENCOUNTER — Telehealth: Payer: Self-pay | Admitting: *Deleted

## 2016-10-19 NOTE — Telephone Encounter (Signed)
Pt called in stating she had heavy vaginal bleeding yesterday but today is normal to light. She denies any sx as in dizziness or feeling light headed. She states heavy bleeding only last part of the day and she is better today. Explained to pt that if she started to have symptoms or changing soaked maxi pad every hour, [facility] would need [facility]. Pt expressed understanding.

## 2016-10-19 NOTE — Telephone Encounter (Signed)
-----   Message from Lindell SparHeather L Bacon, [place] sent at 10/19/2016 10:42 AM EST ----- Regarding: Severe AUB  Contact: [phone] Pt states that she is having an extremely heavy cycle, soaking through a pad every hour and a half. Miscarried back in December. Concerned and wants to speak w a Nurse

## 2016-10-19 NOTE — Telephone Encounter (Signed)
LM for pt to rtn call if needed 

## 2016-11-21 ENCOUNTER — Telehealth: Payer: Self-pay | Admitting: *Deleted

## 2016-11-21 ENCOUNTER — Inpatient Hospital Stay (HOSPITAL_COMMUNITY)
Admission: AD | Admit: 2016-11-21 | Discharge: 2016-11-21 | Disposition: A | Discharge status: Home / Self Care | Payer: 59 | Source: Ambulatory Visit | Attending: Obstetrics & Gynecology | Admitting: Obstetrics & Gynecology

## 2016-11-21 DIAGNOSIS — Z91018 Allergy to other foods: Secondary | ICD-10-CM | POA: Insufficient documentation

## 2016-11-21 DIAGNOSIS — Z3202 Encounter for pregnancy test, result negative: Secondary | ICD-10-CM | POA: Diagnosis not present

## 2016-11-21 DIAGNOSIS — Z823 Family history of stroke: Secondary | ICD-10-CM | POA: Insufficient documentation

## 2016-11-21 DIAGNOSIS — N939 Abnormal uterine and vaginal bleeding, unspecified: Secondary | ICD-10-CM

## 2016-11-21 DIAGNOSIS — Z803 Family history of malignant neoplasm of breast: Secondary | ICD-10-CM | POA: Insufficient documentation

## 2016-11-21 DIAGNOSIS — Z8249 Family history of ischemic heart disease and other diseases of the circulatory system: Secondary | ICD-10-CM | POA: Insufficient documentation

## 2016-11-21 DIAGNOSIS — Z9889 Other specified postprocedural states: Secondary | ICD-10-CM | POA: Diagnosis not present

## 2016-11-21 DIAGNOSIS — Z8042 Family history of malignant neoplasm of prostate: Secondary | ICD-10-CM | POA: Diagnosis not present

## 2016-11-21 DIAGNOSIS — Z800 Family history of malignant neoplasm of digestive organs: Secondary | ICD-10-CM | POA: Diagnosis not present

## 2016-11-21 LAB — URINALYSIS, ROUTINE W REFLEX MICROSCOPIC
BILIRUBIN URINE: NEGATIVE
Glucose, UA: NEGATIVE mg/dL
Ketones, ur: NEGATIVE mg/dL
Leukocytes, UA: NEGATIVE
Nitrite: NEGATIVE
Protein, ur: NEGATIVE mg/dL
SPECIFIC GRAVITY, URINE: 1.012 (ref 1.005–1.030)
pH: 7 (ref 5.0–8.0)

## 2016-11-21 LAB — CBC
HEMATOCRIT: 37.7 % (ref 36.0–46.0)
HEMOGLOBIN: 12.7 g/dL (ref 12.0–15.0)
MCH: 29.3 pg (ref 26.0–34.0)
MCHC: 33.7 g/dL (ref 30.0–36.0)
MCV: 87.1 fL (ref 78.0–100.0)
Platelets: 267 10*3/uL (ref 150–400)
RBC: 4.33 MIL/uL (ref 3.87–5.11)
RDW: 12.9 % (ref 11.5–15.5)
WBC: 5 10*3/uL (ref 4.0–10.5)

## 2016-11-21 LAB — POCT PREGNANCY, URINE: Preg Test, Ur: NEGATIVE

## 2016-11-21 MED ORDER — MEGESTROL ACETATE 20 MG PO TABS: 40.0000 mg | ORAL_TABLET | Freq: Two times a day (BID) | ORAL | 0 refills | Status: DC

## 2016-11-21 MED ORDER — IBUPROFEN 600 MG PO TABS: 600.0000 mg | ORAL_TABLET | Freq: Four times a day (QID) | ORAL | 0 refills | Status: DC | PRN

## 2016-11-21 NOTE — MAU Note (Signed)
Pt started her period on Saturday, woke up with heavy bleeding last night, almost fainted around 0200, passed large clot.  Feels very tired but has not felt lightheaded since then.  Has changed 3 pads since 0200.  Denies pain.

## 2016-11-21 NOTE — Telephone Encounter (Signed)
Pt called in stating she started heavy vaginal bleeding this morning around 0200 and had some presyncopal episodes. She states she now feels extremely drained/fatigued. Advised pt to report to MAU for eval since no MD in office this AM. Pt expressed understanding. MD on call notified.

## 2016-11-21 NOTE — Discharge Instructions (Signed)

## 2016-11-21 NOTE — MAU Provider Note (Signed)
History     CSN: [id number]  Arrival date and time: 11/21/16 1191   First Provider Initiated Contact with Patient 11/21/16 1338      Chief Complaint  Patient presents with  . Vaginal Bleeding   HPI   [name] is 41 y.o. female [phone] non pregnant here in MAU with heavy vaginal bleeding.  Her menstrual cycle started Saturday. She had heavy bleeding for about 24 hours. Her bleeding today is much lighter and is "normal". She was concerned about the heaviness and wanted to make sure everything was ok.   OB History    Gravida Para Term Preterm AB Living   SAB TAB Ectopic Multiple Live Births   1 1 0 0 2      Past Medical History:  Diagnosis Date  . Abnormal Pap smear    cryo age 66;colposcopy 2013  . Asthma    CHILDHOOD  . History of asthma    childhood  . History of diverticulitis of colon 11/2012  . Protein C deficiency [facility])     Past Surgical History:  Procedure Laterality Date  . CESAREAN SECTION  06/08/00  . CESAREAN SECTION  11/05/04  . COLONOSCOPY  12/2012   mod diverticulosis Arlyce Dice)  . COLPOSCOPY  2013  . GYNECOLOGIC CRYOSURGERY  1999    Family History  Problem Relation Age of Onset  . Hypertension Mother   . Hypertension Father   . Diabetes Father   . Cancer Maternal Aunt 50    breast/colon  . Colon cancer Maternal Aunt   . Stroke Paternal Grandmother   . Prostate cancer Paternal Grandfather   . Pulmonary embolism Cousin 28    pulm embolism    Social History  Substance Use Topics  . Smoking status: Never Smoker  . Smokeless tobacco: Never Used  . Alcohol use Yes     Comment: social    Allergies:  Allergies  Allergen Reactions  . Other Hives    Eggplant, pecans, corn, walnuts, tobacco    Prescriptions Prior to Admission  Medication Sig Dispense Refill Last Dose  . ibuprofen (ADVIL,MOTRIN) 800 MG tablet Take 1 tablet (800 mg total) by mouth every 8 (eight) hours as needed. 30 tablet 0 Taking   Results for orders  placed or performed during the hospital encounter of 11/21/16 (from the past 48 hour(s))  Urinalysis, Routine w reflex microscopic     Status: Abnormal   Collection Time: 11/21/16  9:38 AM  Result Value Ref Range   Color, Urine YELLOW YELLOW   APPearance HAZY (A) CLEAR   Specific Gravity, Urine 1.012 1.005 - 1.030   pH 7.0 5.0 - 8.0   Glucose, UA NEGATIVE NEGATIVE mg/dL   Hgb urine dipstick LARGE (A) NEGATIVE   Bilirubin Urine NEGATIVE NEGATIVE   Ketones, ur NEGATIVE NEGATIVE mg/dL   Protein, ur NEGATIVE NEGATIVE mg/dL   Nitrite NEGATIVE NEGATIVE   Leukocytes, UA NEGATIVE NEGATIVE   RBC / HPF TOO NUMEROUS TO COUNT 0 - 5 RBC/hpf   WBC, UA 0-5 0 - 5 WBC/hpf   Bacteria, UA RARE (A) NONE SEEN   Squamous Epithelial / LPF 0-5 (A) NONE SEEN   Mucous PRESENT   Pregnancy, urine POC     Status: None   Collection Time: 11/21/16  9:51 AM  Result Value Ref Range   Preg Test, Ur NEGATIVE NEGATIVE    Comment:        THE SENSITIVITY OF THIS METHODOLOGY  IS >24 mIU/mL   CBC     Status: None   Collection Time: 11/21/16 10:22 AM  Result Value Ref Range   WBC 5.0 4.0 - 10.5 K/uL   RBC 4.33 3.87 - 5.11 MIL/uL   Hemoglobin 12.7 12.0 - 15.0 g/dL   HCT 16.1 09.6 - 04.5 %   MCV 87.1 78.0 - 100.0 fL   MCH 29.3 26.0 - 34.0 pg   MCHC 33.7 30.0 - 36.0 g/dL   RDW 40.9 81.1 - 91.4 %   Platelets 267 150 - 400 K/uL    Review of Systems  Gastrointestinal: Negative for abdominal pain.  Neurological: Negative for dizziness.   Physical Exam   Blood pressure (!) 132/92, pulse 88, temperature 97.9 F (36.6 C), temperature source Oral, resp. rate 18, height  (1.651 m), weight 181 lb (82.1 kg), last menstrual period 11/19/2016.  Physical Exam  Constitutional: She is oriented to person, place, and time. She appears well-developed and well-nourished. No distress.  HENT:  Head: Normocephalic.  Eyes: Pupils are equal, round, and reactive to light.  Musculoskeletal: Normal range of motion.   Neurological: She is alert and oriented to person, place, and time.  Skin: Skin is warm. She is not diaphoretic.  Psychiatric: Her behavior is normal.   MAU Course  Procedures  None  MDM  Patient waited 4+ hours in the lobby.  Patient is without heavy bleeding at this time. Hgb stable.   Assessment and Plan   A:  1. Episode of heavy vaginal bleeding     P:  Discharge home in stable condition Rx: Megace, ibuprofen Call [facility] to schedule a GYN appointment for management Return to MAU for emergencies only   Duane Lope, NP 11/21/2016 2:32 PM

## 2016-11-25 ENCOUNTER — Encounter: Payer: Self-pay | Admitting: Obstetrics and Gynecology

## 2016-11-25 ENCOUNTER — Ambulatory Visit (INDEPENDENT_AMBULATORY_CARE_PROVIDER_SITE_OTHER): Payer: 59 | Admitting: Obstetrics and Gynecology

## 2016-11-25 VITALS — BP 140/92 | HR 90 | Resp 18 | Ht 65.0 in | Wt 182.0 lb

## 2016-11-25 DIAGNOSIS — N879 Dysplasia of cervix uteri, unspecified: Secondary | ICD-10-CM

## 2016-11-25 DIAGNOSIS — N939 Abnormal uterine and vaginal bleeding, unspecified: Secondary | ICD-10-CM

## 2016-11-25 MED ORDER — MEFENAMIC ACID 250 MG PO CAPS: ORAL_CAPSULE | ORAL | 1 refills | Status: DC

## 2016-11-25 NOTE — Progress Notes (Signed)
Obstetrics and Gynecology Follow Up Evaluation  Appointment Date: 11/25/2016  OBGYN Clinic: [facility]  Primary Care Provider: Eustaquio Boyden  Referring Provider: [facility]  Chief Complaint:  Chief Complaint  Patient presents with  . Follow-up    History of Present Illness: [name] is a 41 y.o. African-American [phone] (Patient's last menstrual period was 11/19/2016.), seen for the above chief complaint. Her past medical history is significant for protein c deficiency, h/o c-section x 2, BMI 30    Since having her SAB in 07/2016 she's had heavier periods. They are only 3d and not painful but heavy. Her last one on 4/2 caused her to feel dizzy so she was advised to go to MAU; CBC was normal. No exam done but she was put on Megace.  She states she never started the megace and denies any VB currently.   Review of Systems:  as noted in the History of Present Illness.   Past Medical History:  Past Medical History:  Diagnosis Date  . Abnormal Pap smear    cryo age 16;colposcopy 2013  . Asthma    CHILDHOOD  . History of asthma    childhood  . History of diverticulitis of colon 11/2012  . Protein C deficiency [facility])     Past Surgical History:  Past Surgical History:  Procedure Laterality Date  . CESAREAN SECTION  06/08/00  . CESAREAN SECTION  11/05/04  . COLONOSCOPY  12/2012   mod diverticulosis Arlyce Dice)  . COLPOSCOPY  2013  . GYNECOLOGIC CRYOSURGERY  1999    Past Obstetrical History:  OB History  Gravida Para Term Preterm AB Living  SAB TAB Ectopic Multiple Live Births  1 1 0 0 2    # Outcome Date GA Lbr Len/2nd Weight Sex Delivery Anes PTL Lv  4 SAB 08/18/16 [redacted]w[redacted]d    SAB     3 TAB 03/06/15 [redacted]w[redacted]d    TAB     2 Term 11/05/04 [redacted]w[redacted]d  6 lb 12 oz (3.062 kg) F CS-Unspec   LIV  1 Preterm 06/08/00 [redacted]w[redacted]d  14.9 oz (0.423 kg) F CS-Classical Spinal  DEC      Past Gynecological History: As per HPI. 04/2011 LGSIL pap followed  by benign colposcopic biopsies and ECC 12/2011 LGSIL pap followed by benign colposcopic biopsies and ECC 12/2012 Normal pap and negative HRHPV 2016 NILM, +HR but 16/18 neg 01/2016 NILM, HPV neg  Social History:  Social History   Social History  . Marital status: Divorced    Spouse name: N/A  . Number of children: 1  . Years of education: N/A   Occupational History  . customer service [employer]   Social History Main Topics  . Smoking status: Never Smoker  . Smokeless tobacco: Never Used  . Alcohol use Yes     Comment: social  . Drug use: No  . Sexual activity: Yes    Partners: Male    Birth control/ protection: Condom   Other Topics Concern  . Not on file   Social History Narrative   Lives with 1 daughter (2006)   Occupation: customer service   Edu: some college   Activity: walks some   Diet: good water, fruits/vegetables daily    Family History:  Family History  Problem Relation Age of Onset  . Hypertension Mother   . Hypertension Father   . Diabetes Father   . Cancer Maternal Aunt 50    breast/colon  .  Colon cancer Maternal Aunt   . Stroke Paternal Grandmother   . Prostate cancer Paternal Grandfather   . Pulmonary embolism Cousin 28    pulm embolism    Medications None  Allergies Other   Physical Exam:  BP (!) 140/92 (BP Location: Left Arm, Patient Position: Sitting, Cuff Size: Normal)   Pulse 90   Resp 18   Ht  (1.651 m)   Wt 182 lb (82.6 kg)   LMP 11/19/2016   BMI 30.29 kg/m  Body mass index is 30.29 kg/m. General appearance: Well nourished, well developed female in no acute distress.  Cardiovascular: normal s1 and s2.  No murmurs, rubs or gallops. Respiratory:  Clear to auscultation bilateral. Normal respiratory effort Abdomen: positive bowel sounds and no masses, hernias; diffusely non tender to palpation, non distended Neuro/Psych:  Normal mood and affect.  Skin:  Warm and dry.  Lymphatic:  No inguinal lymphadenopathy.   Pelvic  exam: is not limited by body habitus EGBUS: within normal limits, Vagina: within normal limits and with no blood or discharge in the vault, Cervix: normal appearing cervix without tenderness, discharge or lesions. Uterus:  nonenlarged and non tender and Adnexa:  normal adnexa and no mass, fullness, tenderness Rectovaginal: deferred  Laboratory: none  Radiology: none  Assessment: pt doing well  Plan:  1. Abnormal uterine bleeding (AUB) D/w her re: NSAIDs and LNG and I recommend starting with NSAIDs and she is amenable to this. She does have some prodromal s/s the day prior to menses and recommended that she do mefenamic acid starting then and scheduled x 3d and give it 1-2 cycles. If no effect, pt told to call and talk to her more about Mirena.   Patient amenable to plan.   RTC PRN  Cornelia Copa MD Attending [facility])

## 2016-11-28 ENCOUNTER — Encounter: Payer: Self-pay | Admitting: *Deleted

## 2016-12-07 ENCOUNTER — Ambulatory Visit: Admit: 2016-12-07 | Payer: 59 | Admitting: Obstetrics and Gynecology

## 2016-12-07 SURGERY — LIGATION, FALLOPIAN TUBE, LAPAROSCOPIC
Anesthesia: Choice | Site: Abdomen | Laterality: Bilateral

## 2017-02-14 ENCOUNTER — Ambulatory Visit: Payer: 59 | Admitting: Family Medicine

## 2017-02-16 ENCOUNTER — Ambulatory Visit (INDEPENDENT_AMBULATORY_CARE_PROVIDER_SITE_OTHER): Payer: 59 | Admitting: Family Medicine

## 2017-02-16 ENCOUNTER — Encounter: Payer: Self-pay | Admitting: Family Medicine

## 2017-02-16 VITALS — BP 144/94 | HR 71 | Temp 98.8°F | Wt 180.8 lb

## 2017-02-16 DIAGNOSIS — I10 Essential (primary) hypertension: Secondary | ICD-10-CM | POA: Diagnosis not present

## 2017-02-16 MED ORDER — AMLODIPINE BESYLATE 5 MG PO TABS: 5.0000 mg | ORAL_TABLET | Freq: Every day | ORAL | 6 refills | Status: DC

## 2017-02-16 NOTE — Patient Instructions (Addendum)
Start amlodipine 5mg  daily sent to pharmacy. Return in 2 months for blood pressure follow up.  Your goal blood pressure is <140/90.  Work on low salt/sodium diet - goal <1.5gm (1,500mg ) per day. Eat a diet high in fruits/vegetables and whole grains.  Look into mediterranean and DASH diet.  Goal activity is 16150min/wk of moderate intensity exercise.  This can be split into 30 minute chunks.  If you are not at this level, you can start with smaller 10-15 min increments and slowly build up activity. Look at www.heart.org for more resources   DASH Eating Plan DASH stands for "Dietary Approaches to Stop Hypertension." The DASH eating plan is a healthy eating plan that has been shown to reduce high blood pressure (hypertension). It may also reduce your risk for type 2 diabetes, heart disease, and stroke. The DASH eating plan may also help with weight loss. What are tips for following this plan? General guidelines  Avoid eating more than 2,300 mg (milligrams) of salt (sodium) a day. If you have hypertension, you may need to reduce your sodium intake to 1,500 mg a day.  Limit alcohol intake to no more than 1 drink a day for nonpregnant women and 2 drinks a day for men. One drink equals 12 oz of beer, 5 oz of wine, or 1 oz of hard liquor.  Work with your health care provider to maintain a healthy body weight or to lose weight. Ask what an ideal weight is for you.  Get at least 30 minutes of exercise that causes your heart to beat faster (aerobic exercise) most days of the week. Activities may include walking, swimming, or biking.  Work with your health care provider or diet and nutrition specialist (dietitian) to adjust your eating plan to your individual calorie needs. Reading food labels  Check food labels for the amount of sodium per serving. Choose foods with less than 5 percent of the Daily Value of sodium. Generally, foods with less than 300 mg of sodium per serving fit into this eating  plan.  To find whole grains, look for the word "whole" as the first word in the ingredient list. Shopping  Buy products labeled as "low-sodium" or "no salt added."  Buy fresh foods. Avoid canned foods and premade or frozen meals. Cooking  Avoid adding salt when cooking. Use salt-free seasonings or herbs instead of table salt or sea salt. Check with your health care provider or pharmacist before using salt substitutes.  Do not fry foods. Cook foods using healthy methods such as baking, boiling, grilling, and broiling instead.  Cook with heart-healthy oils, such as olive, canola, soybean, or sunflower oil. Meal planning   Eat a balanced diet that includes: ? 5 or more servings of fruits and vegetables each day. At each meal, try to fill half of your plate with fruits and vegetables. ? Up to 6-8 servings of whole grains each day. ? Less than 6 oz of lean meat, poultry, or fish each day. A 3-oz serving of meat is about the same size as a deck of cards. One egg equals 1 oz. ? 2 servings of low-fat dairy each day. ? A serving of nuts, seeds, or beans 5 times each week. ? Heart-healthy fats. Healthy fats called Omega-3 fatty acids are found in foods such as flaxseeds and coldwater fish, like sardines, salmon, and mackerel.  Limit how much you eat of the following: ? Canned or prepackaged foods. ? Food that is high in trans fat, such as  fried foods. ? Food that is high in saturated fat, such as fatty meat. ? Sweets, desserts, sugary drinks, and other foods with added sugar. ? Full-fat dairy products.  Do not salt foods before eating.  Try to eat at least 2 vegetarian meals each week.  Eat more home-cooked food and less restaurant, buffet, and fast food.  When eating at a restaurant, ask that your food be prepared with less salt or no salt, if possible. What foods are recommended? The items listed may not be a complete list. Talk with your dietitian about what dietary choices are best  for you. Grains Whole-grain or whole-wheat bread. Whole-grain or whole-wheat pasta. Brown rice. Modena Morrow. Bulgur. Whole-grain and low-sodium cereals. Pita bread. Low-fat, low-sodium crackers. Whole-wheat flour tortillas. Vegetables Fresh or frozen vegetables (raw, steamed, roasted, or grilled). Low-sodium or reduced-sodium tomato and vegetable juice. Low-sodium or reduced-sodium tomato sauce and tomato paste. Low-sodium or reduced-sodium canned vegetables. Fruits All fresh, dried, or frozen fruit. Canned fruit in natural juice (without added sugar). Meat and other protein foods Skinless chicken or [place]. Ground chicken or [place]. Pork with fat trimmed off. Fish and seafood. Egg whites. Dried beans, peas, or lentils. Unsalted nuts, nut butters, and seeds. Unsalted canned beans. Lean cuts of beef with fat trimmed off. Low-sodium, lean deli meat. Dairy Low-fat (1%) or fat-free (skim) milk. Fat-free, low-fat, or reduced-fat cheeses. Nonfat, low-sodium ricotta or cottage cheese. Low-fat or nonfat yogurt. Low-fat, low-sodium cheese. Fats and oils Soft margarine without trans fats. Vegetable oil. Low-fat, reduced-fat, or light mayonnaise and salad dressings (reduced-sodium). Canola, safflower, olive, soybean, and sunflower oils. Avocado. Seasoning and other foods Herbs. Spices. Seasoning mixes without salt. Unsalted popcorn and pretzels. Fat-free sweets. What foods are not recommended? The items listed may not be a complete list. Talk with your dietitian about what dietary choices are best for you. Grains Baked goods made with fat, such as croissants, muffins, or some breads. Dry pasta or rice meal packs. Vegetables Creamed or fried vegetables. Vegetables in a cheese sauce. Regular canned vegetables (not low-sodium or reduced-sodium). Regular canned tomato sauce and paste (not low-sodium or reduced-sodium). Regular tomato and vegetable juice (not low-sodium or reduced-sodium). Angie Fava.  Olives. Fruits Canned fruit in a light or heavy syrup. Fried fruit. Fruit in cream or butter sauce. Meat and other protein foods Fatty cuts of meat. Ribs. Fried meat. Berniece Salines. Sausage. Bologna and other processed lunch meats. Salami. Fatback. Hotdogs. Bratwurst. Salted nuts and seeds. Canned beans with added salt. Canned or smoked fish. Whole eggs or egg yolks. Chicken or [place] with skin. Dairy Whole or 2% milk, cream, and half-and-half. Whole or full-fat cream cheese. Whole-fat or sweetened yogurt. Full-fat cheese. Nondairy creamers. Whipped toppings. Processed cheese and cheese spreads. Fats and oils Butter. Stick margarine. Lard. Shortening. Ghee. Bacon fat. Tropical oils, such as coconut, palm kernel, or palm oil. Seasoning and other foods Salted popcorn and pretzels. Onion salt, garlic salt, seasoned salt, table salt, and sea salt. Worcestershire sauce. Tartar sauce. Barbecue sauce. Teriyaki sauce. Soy sauce, including reduced-sodium. Steak sauce. Canned and packaged gravies. Fish sauce. Oyster sauce. Cocktail sauce. Horseradish that you find on the shelf. Ketchup. Mustard. Meat flavorings and tenderizers. Bouillon cubes. Hot sauce and Tabasco sauce. Premade or packaged marinades. Premade or packaged taco seasonings. Relishes. Regular salad dressings. Where to find more information:  National Heart, Lung, and [place]: https://wilson-eaton.com/  American Heart Association: www.heart.org Summary  The DASH eating plan is a healthy eating plan that has been shown to  reduce high blood pressure (hypertension). It may also reduce your risk for type 2 diabetes, heart disease, and stroke.  With the DASH eating plan, you should limit salt (sodium) intake to 2,300 mg a day. If you have hypertension, you may need to reduce your sodium intake to 1,500 mg a day.  When on the DASH eating plan, aim to eat more fresh fruits and vegetables, whole grains, lean proteins, low-fat dairy, and heart-healthy  fats.  Work with your health care provider or diet and nutrition specialist (dietitian) to adjust your eating plan to your individual calorie needs. This information is not intended to replace advice given to you by your health care provider. Make sure you discuss any questions you have with your health care provider. Document Released: 07/28/2011 Document Revised: 08/01/2016 Document Reviewed: 08/01/2016 Elsevier Interactive Patient Education  2017 [employer].

## 2017-02-16 NOTE — Progress Notes (Signed)
BP (!) 144/94 (BP Location: Right Arm, Cuff Size: Large)   Pulse 71   Temp 98.8 F (37.1 C) (Oral)   Wt 180 lb 12 oz (82 kg)   LMP 01/23/2017   SpO2 98%   BMI 30.08 kg/m    CC: HTN Subjective:    Patient ID: [name], female    DOB: 08/21/1976, 41 y.o.   MRN: [id number]  HPI: [name] is a 41 y.o. female presenting on 02/16/2017 for Hypertension (145/102  02/12/17)   Noticing bp elevated over last few months, 145/102 at [facility]. 145/97 at home. Nadir 133/84. No HA, vision changes, CP/tightness, SOB, leg swelling.   She already drinks plenty of water, avoids salt and sodium.  No regular activity.   No h/o hypertension.   Relevant past medical, surgical, family and social history reviewed and updated as indicated. Interim medical history since our last visit reviewed. Allergies and medications reviewed and updated. Outpatient Medications Prior to Visit  Medication Sig Dispense Refill  . cetirizine (ZYRTEC) 10 MG tablet Take 10 mg by mouth daily.    Marland Kitchen. ibuprofen (ADVIL,MOTRIN) 600 MG tablet Take 1 tablet (600 mg total) by mouth every 6 (six) hours as needed. (Patient not taking: Reported on 11/25/2016) 30 tablet 0  . Mefenamic Acid 250 MG CAPS 500mg  po loading dose, and then 250mg  po q6h x 3 days 45 each 1  . megestrol (MEGACE) 20 MG tablet Take 2 tablets (40 mg total) by mouth 2 (two) times daily. (Patient not taking: Reported on 11/25/2016) 28 tablet 0   No facility-administered medications prior to visit.      Per HPI unless specifically indicated in ROS section below Review of Systems     Objective:    BP (!) 144/94 (BP Location: Right Arm, Cuff Size: Large)   Pulse 71   Temp 98.8 F (37.1 C) (Oral)   Wt 180 lb 12 oz (82 kg)   LMP 01/23/2017   SpO2 98%   BMI 30.08 kg/m   Wt Readings from Last 3 Encounters:  02/16/17 180 lb 12 oz (82 kg)  11/25/16 182 lb (82.6 kg)  11/21/16 181 lb (82.1 kg)    Physical Exam  Constitutional: She appears well-developed  and well-nourished. No distress.  HENT:  Head: Normocephalic and atraumatic.  Mouth/Throat: Oropharynx is clear and moist. No oropharyngeal exudate.  Eyes: Conjunctivae and EOM are normal. Pupils are equal, round, and reactive to light. No scleral icterus.  Neck: Normal range of motion. Neck supple. Carotid bruit is not present.  Cardiovascular: Normal rate, regular rhythm, normal heart sounds and intact distal pulses.   No murmur heard. Pulmonary/Chest: Effort normal and breath sounds normal. No respiratory distress. She has no wheezes. She has no rales.  Musculoskeletal: She exhibits no edema.  Lymphadenopathy:    She has no cervical adenopathy.  Skin: Skin is warm and dry. No rash noted.       Assessment & Plan:   Problem List Items Addressed This Visit    Hypertension, essential - Primary    Several documented elevated blood pressure readings over the past several weeks. Reviewed healthy diet and lifestyle changes to help control blood pressures, handout provided. Pt also interested in medication to help control blood pressure. Will start amlodipine 5mg  daily, discussed common side effects to monitor for. RTC 1-2 mo f/u HTN visit. Pt agrees with plan.       Relevant Medications   amLODipine (NORVASC) 5 MG tablet  Follow up plan: Return in about 2 months (around 04/18/2017) for follow up visit.  Eustaquio Boyden, MD

## 2017-02-16 NOTE — Assessment & Plan Note (Signed)
Several documented elevated blood pressure readings over the past several weeks. Reviewed healthy diet and lifestyle changes to help control blood pressures, handout provided. Pt also interested in medication to help control blood pressure. Will start amlodipine 5mg  daily, discussed common side effects to monitor for. RTC 1-2 mo f/u HTN visit. Pt agrees with plan.

## 2017-02-24 ENCOUNTER — Telehealth: Payer: Self-pay

## 2017-02-24 MED ORDER — AMLODIPINE BESYLATE 5 MG PO TABS: 2.5000 mg | ORAL_TABLET | Freq: Every day | ORAL | Status: DC

## 2017-02-24 NOTE — Telephone Encounter (Signed)
Thanks

## 2017-02-24 NOTE — Telephone Encounter (Signed)
Pt last seen 02/16/17 and started taking amlodipine 5 mg taking one daily. On 02/20/17 intermittently pt started with indigestion type chest pain in middle of chest, non exertional; pt said actually more a discomfort than a pain with SOB that only last a few mins. BP averaging 120/76 since taking the amlodipine.periodically also has fast heart beat of 93 for a few mins to 15 mins. No irregular heart beat, no swelling of legs or ankles, no flushing of face;no N&V and no real dizziness.  Pt is not having any symptoms now. Dr Reece AgarG out of office and Dr Para Marchuncan said to cut Amlodipine 5 mg in half taking 2.5 mg daily;pt voiced understanding and is fine with recommendation. pt to cb first of wk with update unless needs to cb sooner; if pt condition changes or [facility] at [facility] FYI to Dr Reece AgarG and Dr Para Marchuncan.(Updated med list)

## 2017-03-06 ENCOUNTER — Telehealth: Payer: Self-pay | Admitting: *Deleted

## 2017-03-06 NOTE — Telephone Encounter (Signed)
Patient left a voicemail stating that she is on Amlodipine and now her left kidney is hurting. Patient wants to know if you think that this could be related to the medication?

## 2017-03-06 NOTE — Telephone Encounter (Signed)
No I don't think amlodipine should cause any kidney or flank pain. Ensure voiding well, no dysuria or other UTI sxs. If so, rec eval.

## 2017-03-07 NOTE — Telephone Encounter (Signed)
Left detailed message.   

## 2017-03-08 NOTE — Telephone Encounter (Signed)
Please close encounter if completed by sign visit.

## 2017-03-15 ENCOUNTER — Ambulatory Visit (INDEPENDENT_AMBULATORY_CARE_PROVIDER_SITE_OTHER): Payer: 59 | Admitting: Family Medicine

## 2017-03-15 ENCOUNTER — Encounter: Payer: Self-pay | Admitting: Family Medicine

## 2017-03-15 VITALS — BP 132/85 | HR 86 | Ht 65.0 in | Wt 178.0 lb

## 2017-03-15 DIAGNOSIS — Z113 Encounter for screening for infections with a predominantly sexual mode of transmission: Secondary | ICD-10-CM | POA: Diagnosis not present

## 2017-03-15 DIAGNOSIS — Z01419 Encounter for gynecological examination (general) (routine) without abnormal findings: Secondary | ICD-10-CM | POA: Diagnosis not present

## 2017-03-15 DIAGNOSIS — Z8742 Personal history of other diseases of the female genital tract: Secondary | ICD-10-CM

## 2017-03-15 NOTE — Patient Instructions (Signed)
Preventive Care 18-39 Years, Female Preventive care refers to lifestyle choices and visits with your health care provider that can promote health and wellness. What does preventive care include?  A yearly physical exam. This is also called an annual well check.  Dental exams once or twice a year.  Routine eye exams. Ask your health care provider how often you should have your eyes checked.  Personal lifestyle choices, including: ? Daily care of your teeth and gums. ? Regular physical activity. ? Eating a healthy diet. ? Avoiding tobacco and drug use. ? Limiting alcohol use. ? Practicing safe sex. ? Taking vitamin and mineral supplements as recommended by your health care provider. What happens during an annual well check? The services and screenings done by your health care provider during your annual well check will depend on your age, overall health, lifestyle risk factors, and family history of disease. Counseling Your health care provider may ask you questions about your:  Alcohol use.  Tobacco use.  Drug use.  Emotional well-being.  Home and relationship well-being.  Sexual activity.  Eating habits.  Work and work Statistician.  Method of birth control.  Menstrual cycle.  Pregnancy history.  Screening You may have the following tests or measurements:  Height, weight, and BMI.  Diabetes screening. This is done by checking your blood sugar (glucose) after you have not eaten for a while (fasting).  Blood pressure.  Lipid and cholesterol levels. These may be checked every 5 years starting at age 66.  Skin check.  Hepatitis C blood test.  Hepatitis B blood test.  Sexually transmitted disease (STD) testing.  BRCA-related cancer screening. This may be done if you have a family history of breast, ovarian, tubal, or peritoneal cancers.  Pelvic exam and Pap test. This may be done every 3 years starting at age 40. Starting at age 59, this may be done every 5  years if you have a Pap test in combination with an HPV test.  Discuss your test results, treatment options, and if necessary, the need for more tests with your health care provider. Vaccines Your health care provider may recommend certain vaccines, such as:  Influenza vaccine. This is recommended every year.  Tetanus, diphtheria, and acellular pertussis (Tdap, Td) vaccine. You may need a Td booster every 10 years.  Varicella vaccine. You may need this if you have not been vaccinated.  HPV vaccine. If you are 69 or younger, you may need three doses over 6 months.  Measles, mumps, and rubella (MMR) vaccine. You may need at least one dose of MMR. You may also need a second dose.  Pneumococcal 13-valent conjugate (PCV13) vaccine. You may need this if you have certain conditions and were not previously vaccinated.  Pneumococcal polysaccharide (PPSV23) vaccine. You may need one or two doses if you smoke cigarettes or if you have certain conditions.  Meningococcal vaccine. One dose is recommended if you are age 27-21 years and a first-year college student living in a residence hall, or if you have one of several medical conditions. You may also need additional booster doses.  Hepatitis A vaccine. You may need this if you have certain conditions or if you travel or work in places where you may be exposed to hepatitis A.  Hepatitis B vaccine. You may need this if you have certain conditions or if you travel or work in places where you may be exposed to hepatitis B.  Haemophilus influenzae type b (Hib) vaccine. You may need this if  you have certain risk factors.  Talk to your health care provider about which screenings and vaccines you need and how often you need them. This information is not intended to replace advice given to you by your health care provider. Make sure you discuss any questions you have with your health care provider. Document Released: 10/04/2001 Document Revised: 04/27/2016  Document Reviewed: 06/09/2015 Elsevier Interactive Patient Education  2017 [employer].

## 2017-03-15 NOTE — Progress Notes (Signed)
Pt desires pap smear and fasting labs today

## 2017-03-15 NOTE — Progress Notes (Signed)
   CLINIC ENCOUNTER NOTE  History:  41 y.o. [id number] here today for yearly well woman. She denies any abnormal vaginal discharge, bleeding, pelvic pain or other concerns.  Reports history of abnormal pap and cryotherapy. Reviewed pap history in detail- since 2013. Not planning on pregnancy and partner might get vasectomy.   Past Medical History:  Diagnosis Date  . Abnormal Pap smear    cryo age 41;colposcopy 2013  . Asthma    CHILDHOOD  . History of asthma    childhood  . History of diverticulitis of colon 11/2012  . 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 CESAREAN SECTION  06/08/00  . CESAREAN SECTION  11/05/04  . COLONOSCOPY  12/2012   mod diverticulosis Arlyce Dice([place])  . COLPOSCOPY  2013  . GYNECOLOGIC CRYOSURGERY  1999    The following portions of the patient's history were reviewed and updated as appropriate: allergies, current medications, past family history, past medical history, past social history, past surgical history and problem list.   Health Maintenance:  Normal pap and negative HRHPV on 01/2017.  Has not had a mammogram yet  Review of Systems:  Pertinent items noted in HPI and remainder of comprehensive ROS otherwise negative.   Objective:  Physical Exam BP 132/85   Pulse 86   Ht 5\' 5"  (1.651 m)   Wt 178 lb (80.7 kg)   LMP 03/04/2017   BMI 29.62 kg/m  CONSTITUTIONAL: Well-developed, well-nourished female in no acute distress.  HENT:  Normocephalic, atraumatic. External right and left ear normal. Oropharynx is clear and moist EYES: Conjunctivae and EOM are normal. Pupils are equal, round, and reactive to light. No scleral icterus.  NECK: Normal range of motion, supple, no masses SKIN: Skin is warm and dry. No rash noted. Not diaphoretic. No erythema. No pallor. NEUROLGIC: Alert and oriented to person, place, and time. Normal reflexes, muscle tone coordination. No cranial nerve deficit noted. PSYCHIATRIC: Normal mood and  affect. Normal behavior. Normal judgment and thought content. CARDIOVASCULAR: Normal heart rate noted RESPIRATORY: Effort and breath sounds normal, no problems with respiration noted BREAST: symmetric, everted nipples bilaterally. No nipple discharge. No palpable masses bilaterally.  ABDOMEN: Soft, no distention noted.   PELVIC: Normal appearing external genitalia; normal appearing vaginal mucosa and cervix.  No abnormal discharge noted.  Normal uterine size, no other palpable masses, no uterine or adnexal tenderness. MUSCULOSKELETAL: Normal range of motion. No edema noted.  Labs and Imaging No results found.  Assessment & Plan:  1. Well woman exam with routine gynecological exam - Cytology - PAP - Lipid panel - Hemoglobin A1c - TSH - Comprehensive metabolic panel - Discussed BCM and reviewed reproductive life planning-- does not desire another pregnancy. Has planned on tubal but partner is planning on a vasectomy. Reviewed Nexplanon and LNG IUD. Gave info on nexplanon  2. H/O abnormal cervical Papanicolaou smear Reviewed results, 2016- had NIL with HPV pos but then followed by neg HPV in 2017. If negative again patient can return to routine screening.    Routine preventative health maintenance measures emphasized. Please refer to After Visit Summary for other counseling recommendations.   Return in about 1 year (around 03/15/2018) for Yearly wellness exam.   Total face-to-face time with patient: 25 minutes. Over 50% of encounter was spent on counseling and coordination of care.

## 2017-03-16 LAB — COMPREHENSIVE METABOLIC PANEL
ALT: 21 IU/L (ref 0–32)
AST: 27 IU/L (ref 0–40)
Albumin/Globulin Ratio: 1.2 (ref 1.2–2.2)
Albumin: 4.3 g/dL (ref 3.5–5.5)
Alkaline Phosphatase: 64 IU/L (ref 39–117)
BUN/Creatinine Ratio: 13 (ref 9–23)
BUN: 11 mg/dL (ref 6–20)
Bilirubin Total: 0.6 mg/dL (ref 0.0–1.2)
CALCIUM: 8.7 mg/dL (ref 8.7–10.2)
CO2: 25 mmol/L (ref 20–29)
CREATININE: 0.88 mg/dL (ref 0.57–1.00)
Chloride: 103 mmol/L (ref 96–106)
GFR, EST AFRICAN AMERICAN: 96 mL/min/{1.73_m2} (ref >59)
GFR, EST NON AFRICAN AMERICAN: 84 mL/min/{1.73_m2} (ref >59)
GLOBULIN, TOTAL: 3.5 g/dL (ref 1.5–4.5)
Glucose: 91 mg/dL (ref 65–99)
Potassium: 4 mmol/L (ref 3.5–5.2)
SODIUM: 140 mmol/L (ref 134–144)
TOTAL PROTEIN: 7.8 g/dL (ref 6.0–8.5)

## 2017-03-16 LAB — LIPID PANEL
CHOLESTEROL TOTAL: 180 mg/dL (ref 100–199)
Chol/HDL Ratio: 3 ratio (ref 0.0–4.4)
HDL: 60 mg/dL (ref >39)
LDL CALC: 108 mg/dL — AB (ref 0–99)
TRIGLYCERIDES: 58 mg/dL (ref 0–149)
VLDL CHOLESTEROL CAL: 12 mg/dL (ref 5–40)

## 2017-03-16 LAB — TSH: TSH: 1.71 u[IU]/mL (ref 0.450–4.500)

## 2017-03-16 LAB — HEMOGLOBIN A1C
ESTIMATED AVERAGE GLUCOSE: 103 mg/dL
Hgb A1c MFr Bld: 5.2 % (ref 4.8–5.6)

## 2017-03-17 LAB — CYTOLOGY - PAP
Chlamydia: NEGATIVE
Diagnosis: NEGATIVE
HPV: NOT DETECTED
Neisseria Gonorrhea: NEGATIVE

## 2017-04-20 ENCOUNTER — Ambulatory Visit: Payer: 59 | Admitting: Family Medicine

## 2017-05-10 ENCOUNTER — Encounter: Payer: Self-pay | Admitting: Family Medicine

## 2017-05-10 ENCOUNTER — Ambulatory Visit (INDEPENDENT_AMBULATORY_CARE_PROVIDER_SITE_OTHER): Payer: 59 | Admitting: Family Medicine

## 2017-05-10 VITALS — BP 124/72 | HR 82 | Temp 98.4°F | Wt 177.2 lb

## 2017-05-10 DIAGNOSIS — Z23 Encounter for immunization: Secondary | ICD-10-CM

## 2017-05-10 DIAGNOSIS — I10 Essential (primary) hypertension: Secondary | ICD-10-CM

## 2017-05-10 MED ORDER — AMLODIPINE BESYLATE 2.5 MG PO TABS: 2.5000 mg | ORAL_TABLET | Freq: Every day | ORAL | 9 refills | Status: DC

## 2017-05-10 NOTE — Assessment & Plan Note (Addendum)
Chronic, improved. Tolerating lower dose of medication better. Continue amlodipine 2.5mg  daily. She continues to monitor sodium intake.  Congratulated on weight loss noted.  Baseline EKG today.

## 2017-05-10 NOTE — Addendum Note (Signed)
Addended by: Nanci Pina on: 05/10/2017 03:36 PM   Modules accepted: Orders

## 2017-05-10 NOTE — Patient Instructions (Addendum)
Baseline EKG today.  Blood pressure is looking good today. Continue amlodipine 2.5mg  daily. Keep watching sodium in diet.  Return as needed or in 1 year for follow up visit.

## 2017-05-10 NOTE — Progress Notes (Addendum)
BP 124/72 (BP Location: Left Arm, Patient Position: Sitting, Cuff Size: Normal)   Pulse 82   Temp 98.4 F (36.9 C) (Oral)   Wt 177 lb 4 oz (80.4 kg)   LMP 04/09/2017   SpO2 99%   BMI 29.50 kg/m    CC: HTN f/u visit Subjective:    Patient ID: [name], female    DOB: Jun 07, 1976, 41 y.o.   MRN: [id number]  HPI: [name] is a 41 y.o. female presenting on 05/10/2017 for 2 mot follow-up   See prior note for details.  Last visit 01/2017 we started amlodipine  daily for uncontrolled hypertension. This may have caused indigestion, palpitations and shortness of breath. Amlodipine was decreased to 2.5mg  daily. She is tolerating this better. She is happy with blood pressure readings now.  She continues to cut down on sodium and has actually lost 3 lbs! No HA, vision changes, CP/tightness, SOB, leg swelling.   Relevant past medical, surgical, family and social history reviewed and updated as indicated. Interim medical history since our last visit reviewed. Allergies and medications reviewed and updated. Outpatient Medications Prior to Visit  Medication Sig Dispense Refill  . cetirizine (ZYRTEC) 10 MG tablet Take 10 mg by mouth daily.    Marland Kitchen amLODipine (NORVASC) 5 MG tablet Take 0.5 tablets (2.5 mg total) by mouth daily.     No facility-administered medications prior to visit.      Per HPI unless specifically indicated in ROS section below Review of Systems     Objective:    BP 124/72 (BP Location: Left Arm, Patient Position: Sitting, Cuff Size: Normal)   Pulse 82   Temp 98.4 F (36.9 C) (Oral)   Wt 177 lb 4 oz (80.4 kg)   LMP 04/09/2017   SpO2 99%   BMI 29.50 kg/m   Wt Readings from Last 3 Encounters:  05/10/17 177 lb 4 oz (80.4 kg)  03/15/17 178 lb (80.7 kg)  02/16/17 180 lb 12 oz (82 kg)    Physical Exam  Constitutional: She appears well-developed and well-nourished. No distress.  HENT:  Head: Normocephalic and atraumatic.  Mouth/Throat: Oropharynx is clear  and moist. No oropharyngeal exudate.  Cardiovascular: Normal rate, regular rhythm, normal heart sounds and intact distal pulses.   No murmur heard. Pulmonary/Chest: Effort normal and breath sounds normal. No respiratory distress. She has no wheezes. She has no rales.  Musculoskeletal: She exhibits no edema.  Nursing note and vitals reviewed.  Results for orders placed or performed in visit on 03/15/17  Lipid panel  Result Value Ref Range   Cholesterol, Total 180 100 - 199 mg/dL   Triglycerides 58 0 - 149 mg/dL   HDL 60 >40 mg/dL   VLDL Cholesterol Cal 12 5 - 40 mg/dL   LDL Calculated 981 (H) 0 - 99 mg/dL   Chol/HDL Ratio 3.0 0.0 - 4.4 ratio  Hemoglobin A1c  Result Value Ref Range   Hgb A1c MFr Bld 5.2 4.8 - 5.6 %   Est. average glucose Bld gHb Est-mCnc 103 mg/dL  TSH  Result Value Ref Range   TSH 1.710 0.450 - 4.500 uIU/mL  Comprehensive metabolic panel  Result Value Ref Range   Glucose 91 65 - 99 mg/dL   BUN 11 6 - 20 mg/dL   Creatinine, Ser 1.91 0.57 - 1.00 mg/dL   GFR calc non Af Amer 84 >59 mL/min/1.73   GFR calc Af Amer 96 >59 mL/min/1.73   BUN/Creatinine Ratio 13 9 - 23  Sodium 140 134 - 144 mmol/L   Potassium 4.0 3.5 - 5.2 mmol/L   Chloride 103 96 - 106 mmol/L   CO2 25 20 - 29 mmol/L   Calcium 8.7 8.7 - 10.2 mg/dL   Total Protein 7.8 6.0 - 8.5 g/dL   Albumin 4.3 3.5 - 5.5 g/dL   Globulin, Total 3.5 1.5 - 4.5 g/dL   Albumin/Globulin Ratio 1.2 1.2 - 2.2   Bilirubin Total 0.6 0.0 - 1.2 mg/dL   Alkaline Phosphatase 64 39 - 117 IU/L   AST 27 0 - 40 IU/L   ALT 21 0 - 32 IU/L  Cytology - PAP  Result Value Ref Range   Adequacy      Satisfactory for evaluation  endocervical/transformation zone component PRESENT.   Diagnosis      NEGATIVE FOR INTRAEPITHELIAL LESIONS OR MALIGNANCY.   Chlamydia Negative    Neisseria gonorrhea Negative    HPV NOT DETECTED    Material Submitted CervicoVaginal Pap [ThinPrep Imaged]    CYTOLOGY - PAP PAP RESULT    EKG - NSR rate 70s,  normal axis, intervals, no acute ST/T changes, good R wave progression    Assessment & Plan:   Problem List Items Addressed This Visit    Hypertension, essential - Primary    Chronic, improved. Tolerating lower dose of medication better. Continue amlodipine 2.5mg  daily. She continues to monitor sodium intake.  Congratulated on weight loss noted.  Baseline EKG today.       Relevant Medications   amLODipine (NORVASC) 2.5 MG tablet       Follow up plan: Return in about 1 year (around 05/10/2018) for annual exam, prior fasting for blood work.  Eustaquio Boyden, MD

## 2017-05-10 NOTE — Addendum Note (Signed)
Addended by: Nanci Pina on: 05/10/2017 03:54 PM   Modules accepted: Orders

## 2017-05-25 ENCOUNTER — Other Ambulatory Visit: Payer: Self-pay

## 2017-05-25 MED ORDER — FLUCONAZOLE 150 MG PO TABS: 150.0000 mg | ORAL_TABLET | Freq: Once | ORAL | 0 refills | Status: AC

## 2017-05-25 NOTE — Telephone Encounter (Signed)
Patient thinks she has yeast and would like a diflucan called into the pharmacy. Rx called in

## 2017-06-18 ENCOUNTER — Encounter (HOSPITAL_COMMUNITY): Payer: Self-pay

## 2017-06-18 ENCOUNTER — Emergency Department (HOSPITAL_COMMUNITY): Payer: 59

## 2017-06-18 ENCOUNTER — Emergency Department (HOSPITAL_COMMUNITY)
Admission: EM | Admit: 2017-06-18 | Discharge: 2017-06-18 | Disposition: A | Discharge status: Home / Self Care | Payer: 59 | Attending: Emergency Medicine | Admitting: Emergency Medicine

## 2017-06-18 DIAGNOSIS — Z79899 Other long term (current) drug therapy: Secondary | ICD-10-CM | POA: Insufficient documentation

## 2017-06-18 DIAGNOSIS — I10 Essential (primary) hypertension: Secondary | ICD-10-CM | POA: Insufficient documentation

## 2017-06-18 DIAGNOSIS — R1031 Right lower quadrant pain: Secondary | ICD-10-CM | POA: Insufficient documentation

## 2017-06-18 DIAGNOSIS — J45909 Unspecified asthma, uncomplicated: Secondary | ICD-10-CM | POA: Insufficient documentation

## 2017-06-18 LAB — COMPREHENSIVE METABOLIC PANEL
ALBUMIN: 3.7 g/dL (ref 3.5–5.0)
ALK PHOS: 68 U/L (ref 38–126)
ALT: 17 U/L (ref 14–54)
ANION GAP: 7 (ref 5–15)
AST: 27 U/L (ref 15–41)
BUN: 12 mg/dL (ref 6–20)
CO2: 23 mmol/L (ref 22–32)
Calcium: 8.4 mg/dL — ABNORMAL LOW (ref 8.9–10.3)
Chloride: 106 mmol/L (ref 101–111)
Creatinine, Ser: 0.92 mg/dL (ref 0.44–1.00)
GFR calc Af Amer: >60 mL/min (ref >60)
GFR calc non Af Amer: >60 mL/min (ref >60)
GLUCOSE: 85 mg/dL (ref 65–99)
POTASSIUM: 3.7 mmol/L (ref 3.5–5.1)
SODIUM: 136 mmol/L (ref 135–145)
Total Bilirubin: 0.6 mg/dL (ref 0.3–1.2)
Total Protein: 7.4 g/dL (ref 6.5–8.1)

## 2017-06-18 LAB — CBC
HCT: 36.8 % (ref 36.0–46.0)
Hemoglobin: 12.3 g/dL (ref 12.0–15.0)
MCH: 28.9 pg (ref 26.0–34.0)
MCHC: 33.4 g/dL (ref 30.0–36.0)
MCV: 86.6 fL (ref 78.0–100.0)
PLATELETS: 277 10*3/uL (ref 150–400)
RBC: 4.25 MIL/uL (ref 3.87–5.11)
RDW: 13.4 % (ref 11.5–15.5)
WBC: 5.6 10*3/uL (ref 4.0–10.5)

## 2017-06-18 LAB — URINALYSIS, ROUTINE W REFLEX MICROSCOPIC
BILIRUBIN URINE: NEGATIVE
Bacteria, UA: NONE SEEN
Glucose, UA: NEGATIVE mg/dL
Ketones, ur: NEGATIVE mg/dL
NITRITE: NEGATIVE
PH: 6 (ref 5.0–8.0)
Protein, ur: NEGATIVE mg/dL
SPECIFIC GRAVITY, URINE: 1.027 (ref 1.005–1.030)

## 2017-06-18 LAB — I-STAT BETA HCG BLOOD, ED (MC, WL, AP ONLY): I-stat hCG, quantitative: <5 m[IU]/mL (ref <5)

## 2017-06-18 LAB — LIPASE, BLOOD: Lipase: 41 U/L (ref 11–51)

## 2017-06-18 NOTE — ED Notes (Signed)
Pt to ultrasound

## 2017-06-18 NOTE — Discharge Instructions (Signed)
The testing today, is reassuring.  We have not found a for your discomfort.  Watch for worsening pain, fever, inability to eat, nausea, vomiting, weakness or dizziness.  If you have these problems, or if not improved in the next few days, follow-up with your primary care doctor or return here for another evaluation.  It is safe to take Tylenol as needed for pain.

## 2017-06-18 NOTE — ED Notes (Signed)
Patient changing into gown 

## 2017-06-18 NOTE — ED Triage Notes (Signed)
Patient complains of lower right sided abdominal pain since last pm, no nausea, no vomiting, no diarrhea. Denies dysuria. NAD

## 2017-06-18 NOTE — ED Provider Notes (Signed)
[facility]   CSN: [id number] Arrival date & time: 06/18/17  1336     History   Chief Complaint No chief complaint on file.   HPI [name] is a 41 y.o. female.  Resents for evaluation right lower quadrant abdominal pain, since yesterday.  It did not bother her while she was sleeping.  She has been eating well.  She was worried that it might be her diverticulitis, so she came here for evaluation.  She denies dysuria, urinary frequency, nausea, vomiting, diarrhea, constipation, back pain, weakness or dizziness.  There are no other known modifying factors.     HPI  Past Medical History:  Diagnosis Date  . Abnormal Pap smear    cryo age 80;colposcopy 2013  . Asthma    CHILDHOOD  . History of asthma    childhood  . History of diverticulitis of colon 11/2012  . 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 H/O abnormal cervical Papanicolaou smear 03/15/2017  . Hypertension, essential 02/16/2017  . Skin rash 04/22/2016  . 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 History of classical cesarean section 02/17/2015  . Diverticulitis of colon 11/29/2012  . 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 CESAREAN SECTION  06/08/00  . CESAREAN SECTION  11/05/04  . COLONOSCOPY  12/2012   mod diverticulosis Arlyce Dice([place])  . COLPOSCOPY  2013  . GYNECOLOGIC CRYOSURGERY  1999    OB History    Gravida Para Term Preterm AB Living   4 2 1 1 2 1    SAB TAB Ectopic Multiple Live Births   1 1 0 0 2       Home Medications    Prior to Admission medications   Medication Sig Start Date End Date Taking? Authorizing Provider  amLODipine (NORVASC) 2.5 MG tablet Take 1 tablet (2.5 mg total) by mouth daily. 05/10/17   Eustaquio BoydenGutierrez, Javier, MD  cetirizine (ZYRTEC) 10 MG tablet Take 10 mg by mouth daily.    [provider]    Family History Family History    Problem Relation Age of Onset  . Hypertension Mother   . Hypertension Father   . Diabetes Father   . Cancer Maternal Aunt 50       breast/colon  . Colon cancer Maternal Aunt   . Stroke Paternal Grandmother   . Prostate cancer Paternal Grandfather   . Pulmonary embolism Cousin 28       pulm embolism    Social History Social History  Substance Use Topics  . Smoking status: Never Smoker  . Smokeless tobacco: Never Used  . Alcohol use Yes     Comment: social     Allergies   Other   Review of Systems Review of Systems  All other systems reviewed and are negative.    Physical Exam Updated Vital Signs BP 123/81 (BP Location: Right Arm)   Pulse 70   Temp 98.1 F (36.7 C) (Oral)   Resp 14   Ht 5\' 5"  (1.651 m)   Wt 79.8 kg (176 lb)   SpO2 100%   BMI 29.29 kg/m   Physical Exam  Constitutional: She is oriented to person, place, and time. She appears well-developed and well-nourished. No distress.  HENT:  Head: Normocephalic and atraumatic.  Eyes: Pupils are equal, round, and reactive to light. Conjunctivae and EOM are normal.  Neck: Normal  range of motion and phonation normal. Neck supple.  Cardiovascular: Normal rate and regular rhythm.   Pulmonary/Chest: Effort normal and breath sounds normal. She exhibits no tenderness.  Abdominal: Soft. She exhibits no distension. There is no tenderness. There is no guarding.  Genitourinary:  Genitourinary Comments: No costovertebral angle tenderness, with percussion  Musculoskeletal: Normal range of motion.  Neurological: She is alert and oriented to person, place, and time. She exhibits normal muscle tone.  Skin: Skin is warm and dry.  Psychiatric: She has a normal mood and affect. Her behavior is normal. Judgment and thought content normal.  Nursing note and vitals reviewed.    ED Treatments / Results  Labs (all labs ordered are listed, but only abnormal results are displayed) Labs Reviewed  COMPREHENSIVE METABOLIC  PANEL - Abnormal; Notable for the following:       Result Value   Calcium 8.4 (*)    All other components within normal limits  URINALYSIS, ROUTINE W REFLEX MICROSCOPIC - Abnormal; Notable for the following:    Hgb urine dipstick LARGE (*)    Leukocytes, UA TRACE (*)    Squamous Epithelial / LPF 0-5 (*)    All other components within normal limits  LIPASE, BLOOD  CBC  I-STAT BETA HCG BLOOD, ED ([facility], [facility], [facility] ONLY)    EKG  EKG Interpretation None       Radiology US Transvaginal Non-ob  Result Date: 06/18/2017 CLINICAL DATA:  Right-sided pelvic pain beginning last night. EXAM: TRANSABDOMINAL AND TRANSVAGINAL ULTRASOUND OF PELVIS TECHNIQUE: Both transabdominal and transvaginal ultrasound examinations of the pelvis were performed. Transabdominal technique was performed for global imaging of the pelvis including uterus, ovaries, adnexal regions, and pelvic cul-de-sac. It was necessary to proceed with endovaginal exam following the transabdominal exam to visualize the ovaries. COMPARISON:  None FINDINGS: Uterus Measurements: 8.6 x 4.9 x 4.9 cm. No fibroids or other mass visualized. C-section scar in lower uterine segment. Endometrium Thickness: 8 mm.  No focal abnormality visualized. Right ovary Measurements: 3.0 x 1.3 x 1.8 cm. Normal appearance/no adnexal mass. Left ovary Measurements: 4.1 x 2.0 x 2.3 cm. Normal appearance/no adnexal mass. Other findings No abnormal free fluid. IMPRESSION: Normal appearance of uterus and ovaries. No pelvic mass or other significant abnormality identified. Electronically Signed   By: Myles Rosenthal M.D.   On: 06/18/2017 17:26   US Pelvis Complete  Result Date: 06/18/2017 CLINICAL DATA:  Right-sided pelvic pain beginning last night. EXAM: TRANSABDOMINAL AND TRANSVAGINAL ULTRASOUND OF PELVIS TECHNIQUE: Both transabdominal and transvaginal ultrasound examinations of the pelvis were performed. Transabdominal technique was performed for global imaging of the pelvis  including uterus, ovaries, adnexal regions, and pelvic cul-de-sac. It was necessary to proceed with endovaginal exam following the transabdominal exam to visualize the ovaries. COMPARISON:  None FINDINGS: Uterus Measurements: 8.6 x 4.9 x 4.9 cm. No fibroids or other mass visualized. C-section scar in lower uterine segment. Endometrium Thickness: 8 mm.  No focal abnormality visualized. Right ovary Measurements: 3.0 x 1.3 x 1.8 cm. Normal appearance/no adnexal mass. Left ovary Measurements: 4.1 x 2.0 x 2.3 cm. Normal appearance/no adnexal mass. Other findings No abnormal free fluid. IMPRESSION: Normal appearance of uterus and ovaries. No pelvic mass or other significant abnormality identified. Electronically Signed   By: Myles Rosenthal M.D.   On: 06/18/2017 17:26    Procedures Procedures (including critical care time)  Medications Ordered in ED Medications - No data to display   Initial Impression / Assessment and Plan / ED Course  I  have reviewed the triage vital signs and the nursing notes.  Pertinent labs & imaging results that were available during my care of the patient were reviewed by me and considered in my medical decision making (see chart for details).      Patient Vitals for the past 24 hrs:  BP Temp Temp src Pulse Resp SpO2 Height Weight  06/18/17 1800 123/81 - - 70 14 100 % - -  06/18/17 1345 136/90 98.1 F (36.7 C) Oral 85 16 100 % 5\' 5"  (1.651 m) 79.8 kg (176 lb)    6:08 PM Reevaluation with update and discussion. After initial assessment and treatment, an updated evaluation reveals no change in clinical status.  She continues to have intermittent right pelvic region discomfort.  Findings discussed with the patient, and her husband, all questions were answered. Maciah Feeback L      Final Clinical Impressions(s) / ED Diagnoses   Final diagnoses:  Right lower quadrant abdominal pain   Nonspecific low abdominal/pelvic pain.  Doubt colitis, appendicitis, ovarian cyst  dysfunction, UTI, serious bacterial infection or metabolic instability.  Nursing Notes Reviewed/ Care Coordinated Applicable Imaging Reviewed Interpretation of Laboratory Data incorporated into ED treatment  The patient appears reasonably screened and/or stabilized for discharge and I doubt any other medical condition or other [facility] requiring further screening, evaluation, or treatment in the ED at this time prior to discharge.  Plan: Home Medications-APAP for pain, continue usual medications; Home Treatments-rest, fluids; return here if the recommended treatment, does not improve the symptoms; Recommended follow up-return here or see PCP for worsening comfort, or new complaints.  I discussed with the patient and her husband a number of things to be watchful for.  New Prescriptions New Prescriptions   No medications on file     Mancel Bale, MD 06/18/17 1810

## 2017-06-18 NOTE — Pre-Procedure Instructions (Signed)
Presentation for abdominal pain, recurrent.

## 2017-06-18 NOTE — ED Notes (Signed)
The pt is waiting on  Ultra sound

## 2017-06-18 NOTE — ED Notes (Signed)
Pt returned from ultra sound wants water  Waiting on us results

## 2017-06-19 ENCOUNTER — Telehealth: Payer: Self-pay | Admitting: *Deleted

## 2017-06-19 DIAGNOSIS — B3731 Acute candidiasis of vulva and vagina: Secondary | ICD-10-CM

## 2017-06-19 DIAGNOSIS — B373 Candidiasis of vulva and vagina: Secondary | ICD-10-CM

## 2017-06-19 MED ORDER — FLUCONAZOLE 150 MG PO TABS: 150.0000 mg | ORAL_TABLET | ORAL | 3 refills | Status: DC

## 2017-06-19 NOTE — Telephone Encounter (Signed)
-----   Message from Lindell SparHeather L Bacon, [place] sent at 06/19/2017 11:50 AM EDT ----- Regarding: Rx refill for Diflucan (2 pills) Contact: [phone] Please call into [facility] on [address]. ## please send two pills, one does not work##

## 2017-10-03 ENCOUNTER — Encounter: Payer: Self-pay | Admitting: Family Medicine

## 2017-10-03 ENCOUNTER — Ambulatory Visit (INDEPENDENT_AMBULATORY_CARE_PROVIDER_SITE_OTHER): Payer: 59 | Admitting: Family Medicine

## 2017-10-03 VITALS — BP 122/82 | HR 91 | Temp 98.7°F | Wt 180.0 lb

## 2017-10-03 DIAGNOSIS — R202 Paresthesia of skin: Secondary | ICD-10-CM | POA: Insufficient documentation

## 2017-10-03 DIAGNOSIS — R0981 Nasal congestion: Secondary | ICD-10-CM | POA: Diagnosis not present

## 2017-10-03 DIAGNOSIS — R0989 Other specified symptoms and signs involving the circulatory and respiratory systems: Secondary | ICD-10-CM | POA: Insufficient documentation

## 2017-10-03 MED ORDER — ALBUTEROL SULFATE HFA 108 (90 BASE) MCG/ACT IN AERS: 2.0000 | INHALATION_SPRAY | Freq: Four times a day (QID) | RESPIRATORY_TRACT | 0 refills | Status: DC | PRN

## 2017-10-03 MED ORDER — VITAMIN B-12 1000 MCG PO TABS: 1000.0000 ug | ORAL_TABLET | Freq: Every day | ORAL | Status: DC

## 2017-10-03 NOTE — Progress Notes (Signed)
BP 122/82 (BP Location: Left Arm, Patient Position: Sitting, Cuff Size: Normal)   Pulse 91   Temp 98.7 F (37.1 C) (Oral)   Wt 180 lb (81.6 kg)   LMP 09/15/2017   SpO2 98%   BMI 29.95 kg/m    CC: cough Subjective:    Patient ID: [name], female    DOB: 01/18/1976, 42 y.o.   MRN: [id number]  HPI: Felicia MountDonna E Stell is a 42 y.o. female presenting on 10/03/2017 for URI (Chest congestion about 2 wks. Also, c/o sinus congestion and itchy ears. Taken Zyrtec) and Tingling (Has had some tingling/numbness in left 4th and 5th fingers. Noticed summer 2018, worsened 06/2017. )   Some "rumbling" in chest noted over last 2 weeks - feels like when she had childhood asthma. No significant cough. No significant dyspnea or wheezing. + head congestion over last 2 days. Some post nasal drainage. Treating with zyrtec.  No fevers/chills, ear or tooth pain, ST, PNdrainage, HA.  H/o childhood asthma - no recent albuterol use.  Daughter coughing at home.   Tingling/numbness of left 4th/5th digits over last 3-4 months. No paresthesias of upper arm. No regular neck pain. Known RTC issues - s/p cortisone shot years ago. No toe paresthesias.  Relevant past medical, surgical, family and social history reviewed and updated as indicated. Interim medical history since our last visit reviewed. Allergies and medications reviewed and updated. Outpatient Medications Prior to Visit  Medication Sig Dispense Refill  . amLODipine (NORVASC) 2.5 MG tablet Take 1 tablet (2.5 mg total) by mouth daily. 30 tablet 9  . cetirizine (ZYRTEC) 10 MG tablet Take 10 mg by mouth daily. As needed    . fluconazole (DIFLUCAN) 150 MG tablet Take 1 tablet (150 mg total) by mouth every 3 (three) days. For three doses 3 tablet 3   No facility-administered medications prior to visit.      Per HPI unless specifically indicated in ROS section below Review of Systems     Objective:    BP 122/82 (BP Location: Left Arm, Patient Position:  Sitting, Cuff Size: Normal)   Pulse 91   Temp 98.7 F (37.1 C) (Oral)   Wt 180 lb (81.6 kg)   LMP 09/15/2017   SpO2 98%   BMI 29.95 kg/m   Wt Readings from Last 3 Encounters:  10/03/17 180 lb (81.6 kg)  06/18/17 176 lb (79.8 kg)  05/10/17 177 lb 4 oz (80.4 kg)   BP Readings from Last 3 Encounters:  10/03/17 122/82  06/18/17 123/81  05/10/17 124/72     Physical Exam  Constitutional: She is oriented to person, place, and time. She appears well-developed and well-nourished. No distress.  HENT:  Head: Normocephalic and atraumatic.  Right Ear: Hearing, external ear and ear canal normal.  Left Ear: Hearing, external ear and ear canal normal.  Nose: Mucosal edema present. No rhinorrhea. Right sinus exhibits no maxillary sinus tenderness and no frontal sinus tenderness. Left sinus exhibits no maxillary sinus tenderness and no frontal sinus tenderness.  Mouth/Throat: Uvula is midline, oropharynx is clear and moist and mucous membranes are normal. No oropharyngeal exudate, posterior oropharyngeal edema, posterior oropharyngeal erythema or tonsillar abscesses.  Fluid behind TMs bilaterally, mild  Eyes: Conjunctivae and EOM are normal. Pupils are equal, round, and reactive to light. No scleral icterus.  Neck: Normal range of motion. Neck supple.  Cardiovascular: Normal rate, regular rhythm, normal heart sounds and intact distal pulses.  No murmur heard. Pulmonary/Chest: Effort normal and breath sounds  normal. No respiratory distress. She has no wheezes. She has no rales.  Lungs clear  Musculoskeletal: She exhibits no edema.  2+ rad pulses bilaterally No pain at medial epicondyle, no appreciable ulnar subluxation near elbow.   Lymphadenopathy:    She has no cervical adenopathy.  Neurological: She is alert and oriented to person, place, and time. She has normal strength. No sensory deficit.  Grip strength intact Neg tinel/phalen  Skin: Skin is warm and dry. No rash noted.  Nursing note  and vitals reviewed.      Assessment & Plan:   Problem List Items Addressed This Visit    Nasal congestion - Primary    Lungs clear. Anticipate allergic rhinitis related symptoms. Supportive care reviewed. In asthma hx, sent in albuterol rescue inhaler PRN. Pt agrees with plan.       Paresthesias in left hand    Not consistent with CTS, cervical radiculopathy, or ulnar neuropathy. ?B12 deficiency - will start daily x 1 month and monitor effect.          Meds ordered this encounter  Medications  . vitamin B-12 (CYANOCOBALAMIN) 1000 MCG tablet    Sig: Take 1 tablet (1,000 mcg total) by mouth daily.  Marland Kitchen albuterol (PROVENTIL HFA;VENTOLIN HFA) 108 (90 Base) MCG/ACT inhaler    Sig: Inhale 2 puffs into the lungs every 6 (six) hours as needed for wheezing or shortness of breath.    Dispense:  1 Inhaler    Refill:  0   No orders of the defined types were placed in this encounter.   Follow up plan: Return if symptoms worsen or fail to improve.  Eustaquio Boyden, MD

## 2017-10-03 NOTE — Assessment & Plan Note (Signed)
Not consistent with CTS, cervical radiculopathy, or ulnar neuropathy. ?B12 deficiency - will start daily x 1 month and monitor effect.

## 2017-10-03 NOTE — Assessment & Plan Note (Signed)
Lungs clear. Anticipate allergic rhinitis related symptoms. Supportive care reviewed. In asthma hx, sent in albuterol rescue inhaler PRN. Pt agrees with plan.

## 2017-10-03 NOTE — Patient Instructions (Addendum)
Try xyzal for allergies.  Start vitamin B12 1000mcg or B complex daily for 1 month and monitor effect.  Lungs sound good today. May try albuterol inhaler as needed for shortness of breath or wheezing.  Let us know if new symptoms develop.

## 2017-10-16 ENCOUNTER — Ambulatory Visit: Payer: Self-pay

## 2017-10-16 NOTE — Telephone Encounter (Signed)
Pt calling to report intermittent congested cough. She states the congestion is located to the left side of her chest. Pt coughing up a small amount of clear phlegm.  Denies fever, SOB, chest pain. Appt made for tomorrow with pt's PCP.  Reason for Disposition . Cough has been present for > 3 weeks  Answer Assessment - Initial Assessment Questions 1. ONSET: "When did the cough begin?"      1 month ago 2. SEVERITY: "How bad is the cough today?"      Has to make herself cough when she feels congestion on the left side 3. RESPIRATORY DISTRESS: "Describe your breathing."      normal 4. FEVER: "Do you have a fever?" If so, ask: "What is your temperature, how was it measured, and when did it start?"     no 5. SPUTUM: "Describe the color of your sputum" (clear, white, yellow, green)     clear 6. HEMOPTYSIS: "Are you coughing up any blood?" If so ask: "How much?" (flecks, streaks, tablespoons, etc.)     No  7. CARDIAC HISTORY: "Do you have any history of heart disease?" (e.g., heart attack, congestive heart failure)      HTN 8. LUNG HISTORY: "Do you have any history of lung disease?"  (e.g., pulmonary embolus, asthma, emphysema)     no 9. PE RISK FACTORS: "Do you have a history of blood clots?" (or: recent major surgery, recent prolonged travel, bedridden )     No 10. OTHER SYMPTOMS: "Do you have any other symptoms?" (e.g., runny nose, wheezing, chest pain)       no 11. PREGNANCY: "Is there any chance you are pregnant?" "When was your last menstrual period?"       No- On menses now 12. TRAVEL: "Have you traveled out of the country in the last month?" (e.g., travel history, exposures)       no  Protocols used: COUGH - ACUTE PRODUCTIVE-A-AH

## 2017-10-17 ENCOUNTER — Ambulatory Visit (INDEPENDENT_AMBULATORY_CARE_PROVIDER_SITE_OTHER): Payer: 59 | Admitting: Family Medicine

## 2017-10-17 ENCOUNTER — Encounter: Payer: Self-pay | Admitting: Family Medicine

## 2017-10-17 VITALS — BP 120/68 | HR 80 | Temp 98.3°F | Wt 177.0 lb

## 2017-10-17 DIAGNOSIS — R0989 Other specified symptoms and signs involving the circulatory and respiratory systems: Secondary | ICD-10-CM | POA: Diagnosis not present

## 2017-10-17 DIAGNOSIS — R319 Hematuria, unspecified: Secondary | ICD-10-CM | POA: Diagnosis not present

## 2017-10-17 DIAGNOSIS — Z6379 Other stressful life events affecting family and household: Secondary | ICD-10-CM

## 2017-10-17 MED ORDER — HYDROXYZINE HCL 10 MG PO TABS: 10.0000 mg | ORAL_TABLET | Freq: Two times a day (BID) | ORAL | 0 refills | Status: DC | PRN

## 2017-10-17 NOTE — Patient Instructions (Addendum)
Lungs sound ok today.  Try hydroxyzine 1-2 tablets as needed for stress/anxiety/sleep. Let us know if not improving with this - send me a mychart message.  Try albuterol inhaler as needed for chest congestion symptoms. Fax me over second FMLA form to review (care of family member form).

## 2017-10-17 NOTE — Progress Notes (Signed)
BP 120/68 (BP Location: Left Arm, Patient Position: Sitting, Cuff Size: Normal)   Pulse 80   Temp 98.3 F (36.8 C) (Oral)   Wt 177 lb (80.3 kg)   LMP 10/15/2017   SpO2 98%   BMI 29.45 kg/m    CC: cough, congestion, stressors Subjective:    Patient ID: [name], female    DOB: 03/04/1976, 42 y.o.   MRN: [id number]  HPI: [name] is a 42 y.o. female presenting on 10/17/2017 for URI (Has had chest congestion on left side about 1 mo. Says she can hear some rattling when she breathes. Was seen previously for same symptoms.)   See prior note. Seen here 10/03/2017 with chest congestion thought allergic related. Treated with albuterol inhaler PRN. No further head congestion or post nasal drainage. No fevers/chills.  Did not try albuterol. Did not try xyzal.  Ongoing chest congestion - comes and goes. Minimal cough of clear mucous. Has trouble describing congestion symptom - denies chest pain, tightness, shortness of breath, palpitations, or .   No recent prolonged travel.  Known h/o protein C deficiency. She has never had blood clots.   Increased stress recently - concerned about health and concerns with daughter's health.  Daughter endorsed SI to school - she is now undergoing counseling with daughter 1 visit every few weeks (counselor is Hurley CiscoBarbara Fousek). Feels counseling has been helpful. Counseling started 10/06/2017 (daughter suspended from school that day).  Increased restless nights recently. Heart races with stress but not otherwise. More emotional recently.  Tries exercise to manage stress.  Prior to last few weeks she did have intermittent depressed mood but recently much worse.  Relevant past medical, surgical, family and social history reviewed and updated as indicated. Interim medical history since our last visit reviewed. Allergies and medications reviewed and updated. Outpatient Medications Prior to Visit  Medication Sig Dispense Refill  . albuterol (PROVENTIL  HFA;VENTOLIN HFA) 108 (90 Base) MCG/ACT inhaler Inhale 2 puffs into the lungs every 6 (six) hours as needed for wheezing or shortness of breath. 1 Inhaler 0  . amLODipine (NORVASC) 2.5 MG tablet Take 1 tablet (2.5 mg total) by mouth daily. 30 tablet 9  . cetirizine (ZYRTEC) 10 MG tablet Take 10 mg by mouth daily. As needed    . vitamin B-12 (CYANOCOBALAMIN) 1000 MCG tablet Take 1 tablet (1,000 mcg total) by mouth daily.     No facility-administered medications prior to visit.      Per HPI unless specifically indicated in ROS section below Review of Systems     Objective:    BP 120/68 (BP Location: Left Arm, Patient Position: Sitting, Cuff Size: Normal)   Pulse 80   Temp 98.3 F (36.8 C) (Oral)   Wt 177 lb (80.3 kg)   LMP 10/15/2017   SpO2 98%   BMI 29.45 kg/m   Wt Readings from Last 3 Encounters:  10/17/17 177 lb (80.3 kg)  10/03/17 180 lb (81.6 kg)  06/18/17 176 lb (79.8 kg)    Physical Exam  Constitutional: She appears well-developed and well-nourished. No distress.  HENT:  Head: Normocephalic and atraumatic.  Right Ear: Hearing, tympanic membrane, external ear and ear canal normal.  Left Ear: Hearing, tympanic membrane, external ear and ear canal normal.  Nose: No mucosal edema or rhinorrhea. Right sinus exhibits no maxillary sinus tenderness and no frontal sinus tenderness. Left sinus exhibits no maxillary sinus tenderness and no frontal sinus tenderness.  Mouth/Throat: Uvula is midline, oropharynx is clear and  moist and mucous membranes are normal. No oropharyngeal exudate, posterior oropharyngeal edema, posterior oropharyngeal erythema or tonsillar abscesses.  Eyes: Conjunctivae and EOM are normal. Pupils are equal, round, and reactive to light. No scleral icterus.  Neck: Normal range of motion. Neck supple.  Cardiovascular: Normal rate, regular rhythm, normal heart sounds and intact distal pulses.  No murmur heard. Pulmonary/Chest: Effort normal and breath sounds  normal. No respiratory distress. She has no wheezes. She has no rales.  Lymphadenopathy:    She has no cervical adenopathy.  Skin: Skin is warm and dry. No rash noted.  Nursing note and vitals reviewed.  Results for orders placed or performed during the hospital encounter of 06/18/17  Lipase, blood  Result Value Ref Range   Lipase 41 11 - 51 U/L  Comprehensive metabolic panel  Result Value Ref Range   Sodium 136 135 - 145 mmol/L   Potassium 3.7 3.5 - 5.1 mmol/L   Chloride 106 101 - 111 mmol/L   CO2 23 22 - 32 mmol/L   Glucose, Bld 85 65 - 99 mg/dL   BUN 12 6 - 20 mg/dL   Creatinine, Ser 1.61 0.44 - 1.00 mg/dL   Calcium 8.4 (L) 8.9 - 10.3 mg/dL   Total Protein 7.4 6.5 - 8.1 g/dL   Albumin 3.7 3.5 - 5.0 g/dL   AST 27 15 - 41 U/L   ALT 17 14 - 54 U/L   Alkaline Phosphatase 68 38 - 126 U/L   Total Bilirubin 0.6 0.3 - 1.2 mg/dL   GFR calc non Af Amer >60 >60 mL/min   GFR calc Af Amer >60 >60 mL/min   Anion gap 7 5 - 15  CBC  Result Value Ref Range   WBC 5.6 4.0 - 10.5 K/uL   RBC 4.25 3.87 - 5.11 MIL/uL   Hemoglobin 12.3 12.0 - 15.0 g/dL   HCT 09.6 04.5 - 40.9 %   MCV 86.6 78.0 - 100.0 fL   MCH 28.9 26.0 - 34.0 pg   MCHC 33.4 30.0 - 36.0 g/dL   RDW 81.1 91.4 - 78.2 %   Platelets 277 150 - 400 K/uL  Urinalysis, Routine w reflex microscopic  Result Value Ref Range   Color, Urine YELLOW YELLOW   APPearance CLEAR CLEAR   Specific Gravity, Urine 1.027 1.005 - 1.030   pH 6.0 5.0 - 8.0   Glucose, UA NEGATIVE NEGATIVE mg/dL   Hgb urine dipstick LARGE (A) NEGATIVE   Bilirubin Urine NEGATIVE NEGATIVE   Ketones, ur NEGATIVE NEGATIVE mg/dL   Protein, ur NEGATIVE NEGATIVE mg/dL   Nitrite NEGATIVE NEGATIVE   Leukocytes, UA TRACE (A) NEGATIVE   RBC / HPF 6-30 0 - 5 RBC/hpf   WBC, UA 0-5 0 - 5 WBC/hpf   Bacteria, UA NONE SEEN NONE SEEN   Squamous Epithelial / LPF 0-5 (A) NONE SEEN   Mucus PRESENT   I-Stat beta hCG blood, ED  Result Value Ref Range   I-stat hCG, quantitative <5.0  <5 mIU/mL   Comment 3              Assessment & Plan:   Problem List Items Addressed This Visit    Chest congestion - Primary    Ongoing, benign exam. Encouraged trying albuterol PRN congestion symptom, update with effect. ?symptoms stress related      Hematuria    Reviewing chart, it seems she had episode of microhematuria found on ER eval 05/2017, I don't see where this was further evaluated -  will need to review with patient at next visit, likely rpt UA.       Stressful life events affecting family and household    Reviewed current family stressors with patient resulting in increase anxiety. She does need to take time to go with daughter to counseling sessions. Advised I would fill out FMLA form so she is able to take daughter to planned appointments. She will fax me these forms.  In interim I also recommended we trial hydroxyzine 10-20mg  PRN anxiety, we also reviewed ongoing healthy stress relieving strategies.           Meds ordered this encounter  Medications  . hydrOXYzine (ATARAX/VISTARIL) 10 MG tablet    Sig: Take 1-2 tablets (10-20 mg total) by mouth 2 (two) times daily as needed for anxiety (sedation precautions).    Dispense:  40 tablet    Refill:  0   No orders of the defined types were placed in this encounter.   Follow up plan: Return if symptoms worsen or fail to improve.  Eustaquio Boyden, MD

## 2017-10-18 ENCOUNTER — Telehealth: Payer: Self-pay | Admitting: Family Medicine

## 2017-10-18 DIAGNOSIS — R319 Hematuria, unspecified: Secondary | ICD-10-CM | POA: Insufficient documentation

## 2017-10-18 DIAGNOSIS — Z6379 Other stressful life events affecting family and household: Secondary | ICD-10-CM | POA: Insufficient documentation

## 2017-10-18 NOTE — Assessment & Plan Note (Addendum)
Reviewed current family stressors with patient resulting in increase anxiety. She does need to take time to go with daughter to counseling sessions. Advised I would fill out FMLA form so she is able to take daughter to planned appointments. She will fax me these forms.  In interim I also recommended we trial hydroxyzine 10-20mg  PRN anxiety, we also reviewed ongoing healthy stress relieving strategies.

## 2017-10-18 NOTE — Assessment & Plan Note (Addendum)
Ongoing, benign exam. Encouraged trying albuterol PRN congestion symptom, update with effect. ?symptoms stress related

## 2017-10-18 NOTE — Assessment & Plan Note (Addendum)
Reviewing chart, it seems she had episode of microhematuria found on ER eval 05/2017, I don't see where this was further evaluated - will need to review with patient at next visit, likely rpt UA.

## 2017-10-18 NOTE — Telephone Encounter (Signed)
Pt dropped off FMLA ppw to be filled out. I placed in blue folder on Robin's desk. °

## 2017-10-19 NOTE — Telephone Encounter (Signed)
Paperwork in dr g in box °

## 2017-10-23 NOTE — Telephone Encounter (Signed)
Left message letting pt know paperwork is ready to pick up pt will need to turn in paperwork

## 2017-10-23 NOTE — Telephone Encounter (Signed)
Form for family member care filled out and in my out box.  Let us know if we need to do anything else. Thanks.

## 2017-11-21 ENCOUNTER — Encounter: Payer: Self-pay | Admitting: Family Medicine

## 2018-01-14 ENCOUNTER — Other Ambulatory Visit: Payer: Self-pay | Admitting: Family Medicine

## 2018-01-16 ENCOUNTER — Encounter: Payer: 59 | Admitting: Obstetrics & Gynecology

## 2018-01-16 ENCOUNTER — Encounter (HOSPITAL_BASED_OUTPATIENT_CLINIC_OR_DEPARTMENT_OTHER): Payer: Self-pay | Admitting: *Deleted

## 2018-01-17 ENCOUNTER — Encounter (HOSPITAL_BASED_OUTPATIENT_CLINIC_OR_DEPARTMENT_OTHER): Payer: Self-pay | Admitting: *Deleted

## 2018-01-19 ENCOUNTER — Encounter (HOSPITAL_BASED_OUTPATIENT_CLINIC_OR_DEPARTMENT_OTHER): Admission: RE | Payer: Self-pay | Source: Ambulatory Visit

## 2018-01-19 ENCOUNTER — Ambulatory Visit (HOSPITAL_BASED_OUTPATIENT_CLINIC_OR_DEPARTMENT_OTHER): Admission: RE | Admit: 2018-01-19 | Payer: 59 | Source: Ambulatory Visit | Admitting: Obstetrics and Gynecology

## 2018-01-19 HISTORY — DX: Essential (primary) hypertension: I10

## 2018-01-19 HISTORY — DX: Missed abortion: O02.1

## 2018-01-19 HISTORY — DX: Personal history of other diseases of the female genital tract: Z87.42

## 2018-01-19 HISTORY — DX: Diverticulosis of large intestine without perforation or abscess without bleeding: K57.30

## 2018-01-19 SURGERY — LIGATION, FALLOPIAN TUBE, LAPAROSCOPIC
Anesthesia: General

## 2018-01-20 DIAGNOSIS — O039 Complete or unspecified spontaneous abortion without complication: Secondary | ICD-10-CM

## 2018-01-20 HISTORY — DX: Complete or unspecified spontaneous abortion without complication: O03.9

## 2018-04-10 ENCOUNTER — Encounter: Payer: 59 | Admitting: Family Medicine

## 2018-04-18 ENCOUNTER — Ambulatory Visit (INDEPENDENT_AMBULATORY_CARE_PROVIDER_SITE_OTHER): Payer: 59 | Admitting: Family Medicine

## 2018-04-18 ENCOUNTER — Encounter: Payer: Self-pay | Admitting: Family Medicine

## 2018-04-18 VITALS — BP 120/82 | HR 82 | Temp 97.9°F | Ht 65.0 in | Wt 173.0 lb

## 2018-04-18 DIAGNOSIS — M7121 Synovial cyst of popliteal space [Baker], right knee: Secondary | ICD-10-CM | POA: Insufficient documentation

## 2018-04-18 NOTE — Patient Instructions (Signed)
You may have a bit of early arthritis leading to swelling of knee and baker's cyst - treat with knee sleeve and knee strenghtening exercises provided today May use ibuprofen as needed for discomfort. Let us know if not improving with treatment.

## 2018-04-18 NOTE — Progress Notes (Signed)
BP 120/82 (BP Location: Left Arm, Patient Position: Sitting, Cuff Size: Normal)   Pulse 82   Temp 97.9 F (36.6 C) (Oral)   Ht 5\' 5"  (1.651 m)   Wt 173 lb (78.5 kg)   LMP 04/12/2018   SpO2 98%   BMI 28.79 kg/m    CC: knee trouble Subjective:    Patient ID: [name], female    DOB: Sep 26, 1975, 42 y.o.   MRN: [id number]  HPI: [name] is a 42 y.o. female presenting on 04/18/2018 for Knee Pain (C/o right knee pain for 2 wks. Knee is swollen and sometimes pops. Thinks it may be bursitis. )   2 wk h/o R knee swelling, hears knee pop when she walks. No significant pain, more tight discomfort. Worse with bending, feels tight. Trouble going up or down stairs. She recently moved to house with stairs.  Knee sleeve helps with pain. Tried ibuprofen without benefit.   No redness or warmth.  No h/o knee surgery or prior trauma.  Denies inciting trauma/injuyr or falls.   Relevant past medical, surgical, family and social history reviewed and updated as indicated. Interim medical history since our last visit reviewed. Allergies and medications reviewed and updated. Outpatient Medications Prior to Visit  Medication Sig Dispense Refill  . albuterol (PROVENTIL HFA;VENTOLIN HFA) 108 (90 Base) MCG/ACT inhaler Inhale 2 puffs into the lungs every 6 (six) hours as needed for wheezing or shortness of breath. 1 Inhaler 0  . amLODipine (NORVASC) 2.5 MG tablet TAKE 1 TABLET BY MOUTH EVERY DAY 30 tablet 5  . cetirizine (ZYRTEC) 10 MG tablet Take 10 mg by mouth daily. As needed    . hydrOXYzine (ATARAX/VISTARIL) 10 MG tablet Take 1-2 tablets (10-20 mg total) by mouth 2 (two) times daily as needed for anxiety (sedation precautions). 40 tablet 0  . vitamin B-12 (CYANOCOBALAMIN) 1000 MCG tablet Take 1 tablet (1,000 mcg total) by mouth daily.     No facility-administered medications prior to visit.      Per HPI unless specifically indicated in ROS section below Review of Systems     Objective:      BP 120/82 (BP Location: Left Arm, Patient Position: Sitting, Cuff Size: Normal)   Pulse 82   Temp 97.9 F (36.6 C) (Oral)   Ht 5\' 5"  (1.651 m)   Wt 173 lb (78.5 kg)   LMP 04/12/2018   SpO2 98%   BMI 28.79 kg/m   Wt Readings from Last 3 Encounters:  04/18/18 173 lb (78.5 kg)  10/17/17 177 lb (80.3 kg)  10/03/17 180 lb (81.6 kg)    Physical Exam  Constitutional: She appears well-developed and well-nourished. No distress.  Musculoskeletal: She exhibits no edema.  L knee WNL R knee exam: No deformity on inspection. No pain with palpation of knee landmarks. Swelling noted to knee. FROM in flex/extension without significant crepitus. + popliteal fullness. Neg drawer test. Neg mcmurray test. No pain with valgus/varus stress. No PFgrind. No abnormal patellar mobility.   Skin: Skin is warm. No rash noted.  Psychiatric: She has a normal mood and affect.  Nursing note and vitals reviewed.     Assessment & Plan:   Problem List Items Addressed This Visit    Baker's cyst of knee, right - Primary    Fullness at R popliteal area but not significant pain, knee exam largely benign. ?beginning of DJD leading to cyst development. Conservative measures reviewed - knee sleeve brace use, provided with general knee strengthening exercises  [facility]  Update if not improving with treatment.          No orders of the defined types were placed in this encounter.  No orders of the defined types were placed in this encounter.   Follow up plan: Return if symptoms worsen or fail to improve.  Eustaquio BoydenJavier Lyndol Vanderheiden, MD

## 2018-04-18 NOTE — Assessment & Plan Note (Signed)
Fullness at R popliteal area but not significant pain, knee exam largely benign. ?beginning of DJD leading to cyst development. Conservative measures reviewed - knee sleeve brace use, provided with general knee strengthening exercises from [facility] pt advisor  Update if not improving with treatment.

## 2018-05-07 ENCOUNTER — Other Ambulatory Visit (INDEPENDENT_AMBULATORY_CARE_PROVIDER_SITE_OTHER): Payer: 59

## 2018-05-07 ENCOUNTER — Other Ambulatory Visit: Payer: Self-pay | Admitting: Family Medicine

## 2018-05-07 DIAGNOSIS — I10 Essential (primary) hypertension: Secondary | ICD-10-CM

## 2018-05-07 LAB — BASIC METABOLIC PANEL
BUN: 17 mg/dL (ref 6–23)
CALCIUM: 8.9 mg/dL (ref 8.4–10.5)
CO2: 25 meq/L (ref 19–32)
CREATININE: 0.88 mg/dL (ref 0.40–1.20)
Chloride: 104 mEq/L (ref 96–112)
GFR: 91.55 mL/min (ref >60.00)
Glucose, Bld: 83 mg/dL (ref 70–99)
Potassium: 3.7 mEq/L (ref 3.5–5.1)
Sodium: 137 mEq/L (ref 135–145)

## 2018-05-07 LAB — LIPID PANEL
CHOL/HDL RATIO: 3
Cholesterol: 164 mg/dL (ref 0–200)
HDL: 59.7 mg/dL (ref >39.00)
LDL Cholesterol: 96 mg/dL (ref 0–99)
NonHDL: 104.51
TRIGLYCERIDES: 43 mg/dL (ref 0.0–149.0)
VLDL: 8.6 mg/dL (ref 0.0–40.0)

## 2018-05-11 ENCOUNTER — Encounter: Payer: 59 | Admitting: Family Medicine

## 2018-05-15 ENCOUNTER — Encounter: Payer: Self-pay | Admitting: Family Medicine

## 2018-05-15 ENCOUNTER — Ambulatory Visit (INDEPENDENT_AMBULATORY_CARE_PROVIDER_SITE_OTHER): Payer: 59 | Admitting: Family Medicine

## 2018-05-15 VITALS — BP 122/80 | HR 74 | Temp 98.5°F | Ht 64.5 in | Wt 172.5 lb

## 2018-05-15 DIAGNOSIS — R202 Paresthesia of skin: Secondary | ICD-10-CM | POA: Diagnosis not present

## 2018-05-15 DIAGNOSIS — I10 Essential (primary) hypertension: Secondary | ICD-10-CM

## 2018-05-15 DIAGNOSIS — R61 Generalized hyperhidrosis: Secondary | ICD-10-CM

## 2018-05-15 DIAGNOSIS — Z0000 Encounter for general adult medical examination without abnormal findings: Secondary | ICD-10-CM | POA: Diagnosis not present

## 2018-05-15 DIAGNOSIS — R319 Hematuria, unspecified: Secondary | ICD-10-CM

## 2018-05-15 DIAGNOSIS — D6859 Other primary thrombophilia: Secondary | ICD-10-CM | POA: Diagnosis not present

## 2018-05-15 LAB — CBC WITH DIFFERENTIAL/PLATELET
BASOS ABS: 0.1 10*3/uL (ref 0.0–0.1)
Basophils Relative: 1 % (ref 0.0–3.0)
EOS ABS: 0.1 10*3/uL (ref 0.0–0.7)
Eosinophils Relative: 2.7 % (ref 0.0–5.0)
HCT: 41.5 % (ref 36.0–46.0)
Hemoglobin: 13.9 g/dL (ref 12.0–15.0)
LYMPHS ABS: 1.6 10*3/uL (ref 0.7–4.0)
LYMPHS PCT: 30.3 % (ref 12.0–46.0)
MCHC: 33.6 g/dL (ref 30.0–36.0)
MCV: 89.5 fl (ref 78.0–100.0)
MONO ABS: 0.3 10*3/uL (ref 0.1–1.0)
Monocytes Relative: 6.4 % (ref 3.0–12.0)
NEUTROS ABS: 3.2 10*3/uL (ref 1.4–7.7)
NEUTROS PCT: 59.6 % (ref 43.0–77.0)
PLATELETS: 279 10*3/uL (ref 150.0–400.0)
RBC: 4.63 Mil/uL (ref 3.87–5.11)
RDW: 13.5 % (ref 11.5–15.5)
WBC: 5.4 10*3/uL (ref 4.0–10.5)

## 2018-05-15 LAB — TSH: TSH: 0.98 u[IU]/mL (ref 0.35–4.50)

## 2018-05-15 MED ORDER — HYDROXYZINE HCL 10 MG PO TABS: 10.0000 mg | ORAL_TABLET | Freq: Two times a day (BID) | ORAL | 3 refills | Status: DC | PRN

## 2018-05-15 MED ORDER — AMLODIPINE BESYLATE 2.5 MG PO TABS: 2.5000 mg | ORAL_TABLET | Freq: Every day | ORAL | 3 refills | Status: DC

## 2018-05-15 NOTE — Assessment & Plan Note (Signed)
This improved after b12 replacement

## 2018-05-15 NOTE — Progress Notes (Signed)
BP 122/80 (BP Location: Left Arm, Patient Position: Sitting, Cuff Size: Normal)   Pulse 74   Temp 98.5 F (36.9 C) (Oral)   Ht 5' 4.5" (1.638 m)   Wt 172 lb 8 oz (78.2 kg)   SpO2 97%   BMI 29.15 kg/m    CC: CPE Subjective:    Patient ID: [name], female    DOB: 03/01/1976, 42 y.o.   MRN: [id number]  HPI: [name] is a 42 y.o. female presenting on 05/15/2018 for Annual Exam   Off and on mood trouble. She was on antidepressants in the past - felt this was helpful. Episodes can last 3 days. Menstrual related symptoms. Denies SI/HI. Declines medication at this time. Continues hydroxyzine PRN anxiety.  She did have miscarriage 01/2018. Second one. Saw Dr Huntley Decomlin.  Wants to have BTL.  Endorses intermittent night sweats over the past month. Can come on a few times a week. Sets thermostat at home to 74 F.   Preventative: Colon cancer screening - not yet due Breast cancer screening - not yet - 1/2 aunt with h/o breast cancer Well woman exam - with OBGYN Dr Alvester [place]. H/o abnormal pap with cryotherapy. Latest  normal 02/2017, rec return to routine screening [id number].  DEXA scan - not due  Flu shot - declines Tdap 04/2017 Pneumonia shot - not due Shingrix - not due Seat belt use discussed Sunscreen use discussed. No changing moles on skin. Smoking - non smoker Alcohol - 1-2 glasses of wine twice a week Dentist - yearly Eye exam - yearly, more regularly this year for recurring keratitis   Lives with 1 daughter (2006) and husband Occupation: Clinical biochemistcustomer service Edu: some college Activity: walks dog daily  Diet: good water, fruits/vegetables daily   Relevant past medical, surgical, family and social history reviewed and updated as indicated. Interim medical history since our last visit reviewed. Allergies and medications reviewed and updated. Outpatient Medications Prior to Visit  Medication Sig Dispense Refill  . albuterol (PROVENTIL HFA;VENTOLIN HFA) 108 (90 Base) MCG/ACT  inhaler Inhale 2 puffs into the lungs every 6 (six) hours as needed for wheezing or shortness of breath. 1 Inhaler 0  . cetirizine (ZYRTEC) 10 MG tablet Take 10 mg by mouth daily. As needed    . vitamin B-12 (CYANOCOBALAMIN) 1000 MCG tablet Take 1 tablet (1,000 mcg total) by mouth daily.    Marland Kitchen. amLODipine (NORVASC) 2.5 MG tablet TAKE 1 TABLET BY MOUTH EVERY DAY 30 tablet 5  . hydrOXYzine (ATARAX/VISTARIL) 10 MG tablet Take 1-2 tablets (10-20 mg total) by mouth 2 (two) times daily as needed for anxiety (sedation precautions). 40 tablet 0   No facility-administered medications prior to visit.      Per HPI unless specifically indicated in ROS section below Review of Systems  Constitutional: Negative for activity change, appetite change, chills, fatigue, fever and unexpected weight change.       Night sweats over the past month  HENT: Negative for hearing loss.   Eyes: Negative for visual disturbance.  Respiratory: Negative for cough, chest tightness, shortness of breath and wheezing.   Cardiovascular: Negative for chest pain, palpitations and leg swelling.  Gastrointestinal: Negative for abdominal distention, abdominal pain, blood in stool, constipation, diarrhea, nausea and vomiting.  Genitourinary: Negative for difficulty urinating and hematuria.  Musculoskeletal: Negative for arthralgias, myalgias and neck pain.  Skin: Negative for rash.  Neurological: Negative for dizziness, seizures, syncope and headaches.  Hematological: Negative for adenopathy. Does not bruise/bleed easily.  Psychiatric/Behavioral: Positive for dysphoric mood. The patient is nervous/anxious.        Objective:    BP 122/80 (BP Location: Left Arm, Patient Position: Sitting, Cuff Size: Normal)   Pulse 74   Temp 98.5 F (36.9 C) (Oral)   Ht 5' 4.5" (1.638 m)   Wt 172 lb 8 oz (78.2 kg)   SpO2 97%   BMI 29.15 kg/m   Wt Readings from Last 3 Encounters:  05/15/18 172 lb 8 oz (78.2 kg)  04/18/18 173 lb (78.5 kg)    10/17/17 177 lb (80.3 kg)    Physical Exam  Constitutional: She is oriented to person, place, and time. She appears well-developed and well-nourished. No distress.  HENT:  Head: Normocephalic and atraumatic.  Right Ear: Hearing, tympanic membrane, external ear and ear canal normal.  Left Ear: Hearing, tympanic membrane, external ear and ear canal normal.  Nose: Nose normal.  Mouth/Throat: Uvula is midline, oropharynx is clear and moist and mucous membranes are normal. No oropharyngeal exudate, posterior oropharyngeal edema or posterior oropharyngeal erythema.  Eyes: Pupils are equal, round, and reactive to light. Conjunctivae and EOM are normal. No scleral icterus.  Neck: Normal range of motion. Neck supple.  Cardiovascular: Normal rate, regular rhythm, normal heart sounds and intact distal pulses.  No murmur heard. Pulses:      Radial pulses are 2+ on the right side, and 2+ on the left side.  Pulmonary/Chest: Effort normal and breath sounds normal. No respiratory distress. She has no wheezes. She has no rales.  Abdominal: Soft. Bowel sounds are normal. She exhibits no distension and no mass. There is no tenderness. There is no rebound and no guarding.  Musculoskeletal: Normal range of motion. She exhibits no edema.  Lymphadenopathy:    She has no cervical adenopathy.  Neurological: She is alert and oriented to person, place, and time.  CN grossly intact, station and gait intact  Skin: Skin is warm and dry. No rash noted.  Psychiatric: She has a normal mood and affect. Her behavior is normal. Judgment and thought content normal.  Nursing note and vitals reviewed.  Results for orders placed or performed in visit on 05/07/18  Basic metabolic panel  Result Value Ref Range   Sodium 137 135 - 145 mEq/L   Potassium 3.7 3.5 - 5.1 mEq/L   Chloride 104 96 - 112 mEq/L   CO2 25 19 - 32 mEq/L   Glucose, Bld 83 70 - 99 mg/dL   BUN 17 6 - 23 mg/dL   Creatinine, Ser 0.86 0.40 - 1.20 mg/dL    Calcium 8.9 8.4 - 57.8 mg/dL   GFR 46.96 >29.52 mL/min  Lipid panel  Result Value Ref Range   Cholesterol 164 0 - 200 mg/dL   Triglycerides 84.1 0.0 - 149.0 mg/dL   HDL 32.44 >01.02 mg/dL   VLDL 8.6 0.0 - 72.5 mg/dL   LDL Cholesterol 96 0 - 99 mg/dL   Total CHOL/HDL Ratio 3    NonHDL 104.51    Lab Results  Component Value Date   TSH 1.710 03/15/2017      Assessment & Plan:   Problem List Items Addressed This Visit    Protein C deficiency (HCC)   Paresthesias in left hand    This improved after b12 replacement       Hypertension, essential    Chronic, stable. Continue current regimen.       Relevant Medications   amLODipine (NORVASC) 2.5 MG tablet   Hematuria  She had hematuria on UA at ER eval 05/2017 - pt states she was on her period.  She is currently on her period again.  I will have her return at her convenience for nurse visit for repeat urinalysis with reflex to microscopy if abnormal.       Relevant Orders   Urinalysis, Routine w reflex microscopic   Health maintenance examination - Primary    Preventative protocols reviewed and updated unless pt declined. Discussed healthy diet and lifestyle.        Other Visit Diagnoses    Night sweat       Relevant Orders   TSH   CBC with Differential/Platelet       Meds ordered this encounter  Medications  . hydrOXYzine (ATARAX/VISTARIL) 10 MG tablet    Sig: Take 1-2 tablets (10-20 mg total) by mouth 2 (two) times daily as needed for anxiety (sedation precautions).    Dispense:  40 tablet    Refill:  3  . amLODipine (NORVASC) 2.5 MG tablet    Sig: Take 1 tablet (2.5 mg total) by mouth daily.    Dispense:  90 tablet    Refill:  3   Orders Placed This Encounter  Procedures  . TSH  . CBC with Differential/Platelet  . Urinalysis, Routine w reflex microscopic    Standing Status:   Future    Standing Expiration Date:   05/16/2019    Follow up plan: Return in about 1 year (around 05/16/2019) for annual  exam, prior fasting for blood work.  Eustaquio Boyden, MD

## 2018-05-15 NOTE — Assessment & Plan Note (Signed)
Chronic, stable. Continue current regimen. 

## 2018-05-15 NOTE — Assessment & Plan Note (Addendum)
She had hematuria on UA at ER eval 05/2017 - pt states she was on her period.  She is currently on her period again.  I will have her return at her convenience for nurse visit for repeat urinalysis with reflex to microscopy if abnormal.

## 2018-05-15 NOTE — Assessment & Plan Note (Signed)
Preventative protocols reviewed and updated unless pt declined. Discussed healthy diet and lifestyle.  

## 2018-05-15 NOTE — Patient Instructions (Addendum)
You are doing well today.  Return as needed or in 1 year for next physical. Check labs today for thyroid and blood count. Health Maintenance, Female Adopting a healthy lifestyle and getting preventive care can go a long way to promote health and wellness. Talk with your health care provider about what schedule of regular examinations is right for you. This is a good chance for you to check in with your provider about disease prevention and staying healthy. In between checkups, there are plenty of things you can do on your own. Experts have done a lot of research about which lifestyle changes and preventive measures are most likely to keep you healthy. Ask your health care provider for more information. Weight and diet Eat a healthy diet  Be sure to include plenty of vegetables, fruits, low-fat dairy products, and lean protein.  Do not eat a lot of foods high in solid fats, added sugars, or salt.  Get regular exercise. This is one of the most important things you can do for your health. ? Most adults should exercise for at least 150 minutes each week. The exercise should increase your heart rate and make you sweat (moderate-intensity exercise). ? Most adults should also do strengthening exercises at least twice a week. This is in addition to the moderate-intensity exercise.  Maintain a healthy weight  Body mass index (BMI) is a measurement that can be used to identify possible weight problems. It estimates body fat based on height and weight. Your health care provider can help determine your BMI and help you achieve or maintain a healthy weight.  For females 28 years of age and older: ? A BMI below 18.5 is considered underweight. ? A BMI of 18.5 to 24.9 is normal. ? A BMI of 25 to 29.9 is considered overweight. ? A BMI of 30 and above is considered obese.  Watch levels of cholesterol and blood lipids  You should start having your blood tested for lipids and cholesterol at 40 years of age,  then have this test every 5 years.  You may need to have your cholesterol levels checked more often if: ? Your lipid or cholesterol levels are high. ? You are older than 40 years of age. ? You are at high risk for heart disease.  Cancer screening Lung Cancer  Lung cancer screening is recommended for adults 12-50 years old who are at high risk for lung cancer because of a history of smoking.  A yearly low-dose CT scan of the lungs is recommended for people who: ? Currently smoke. ? Have quit within the past 15 years. ? Have at least a 30-pack-year history of smoking. A pack year is smoking an average of one pack of cigarettes a day for 1 year.  Yearly screening should continue until it has been 15 years since you quit.  Yearly screening should stop if you develop a health problem that would prevent you from having lung cancer treatment.  Breast Cancer  Practice breast self-awareness. This means understanding how your breasts normally appear and feel.  It also means doing regular breast self-exams. Let your health care provider know about any changes, no matter how small.  If you are in your 20s or 30s, you should have a clinical breast exam (CBE) by a health care provider every 1-3 years as part of a regular health exam.  If you are 71 or older, have a CBE every year. Also consider having a breast X-ray (mammogram) every year.  If  you have a family history of breast cancer, talk to your health care provider about genetic screening.  If you are at high risk for breast cancer, talk to your health care provider about having an MRI and a mammogram every year.  Breast cancer gene (BRCA) assessment is recommended for women who have family members with BRCA-related cancers. BRCA-related cancers include: ? Breast. ? Ovarian. ? Tubal. ? Peritoneal cancers.  Results of the assessment will determine the need for genetic counseling and BRCA1 and BRCA2 testing.  Cervical Cancer Your  health care provider may recommend that you be screened regularly for cancer of the pelvic organs (ovaries, uterus, and vagina). This screening involves a pelvic examination, including checking for microscopic changes to the surface of your cervix (Pap test). You may be encouraged to have this screening done every 3 years, beginning at age 36.  For women ages 39-65, health care providers may recommend pelvic exams and Pap testing every 3 years, or they may recommend the Pap and pelvic exam, combined with testing for human papilloma virus (HPV), every 5 years. Some types of HPV increase your risk of cervical cancer. Testing for HPV may also be done on women of any age with unclear Pap test results.  Other health care providers may not recommend any screening for nonpregnant women who are considered low risk for pelvic cancer and who do not have symptoms. Ask your health care provider if a screening pelvic exam is right for you.  If you have had past treatment for cervical cancer or a condition that could lead to cancer, you need Pap tests and screening for cancer for at least 20 years after your treatment. If Pap tests have been discontinued, your risk factors (such as having a new sexual partner) need to be reassessed to determine if screening should resume. Some women have medical problems that increase the chance of getting cervical cancer. In these cases, your health care provider may recommend more frequent screening and Pap tests.  Colorectal Cancer  This type of cancer can be detected and often prevented.  Routine colorectal cancer screening usually begins at 40 years of age and continues through 40 years of age.  Your health care provider may recommend screening at an earlier age if you have risk factors for colon cancer.  Your health care provider may also recommend using home test kits to check for hidden blood in the stool.  A small camera at the end of a tube can be used to examine your  colon directly (sigmoidoscopy or colonoscopy). This is done to check for the earliest forms of colorectal cancer.  Routine screening usually begins at age 62.  Direct examination of the colon should be repeated every 5-10 years through 39 years of age. However, you may need to be screened more often if early forms of precancerous polyps or small growths are found.  Skin Cancer  Check your skin from head to toe regularly.  Tell your health care provider about any new moles or changes in moles, especially if there is a change in a mole's shape or color.  Also tell your health care provider if you have a mole that is larger than the size of a pencil eraser.  Always use sunscreen. Apply sunscreen liberally and repeatedly throughout the day.  Protect yourself by wearing long sleeves, pants, a wide-brimmed hat, and sunglasses whenever you are outside.  Heart disease, diabetes, and high blood pressure  High blood pressure causes heart disease and increases  the risk of stroke. High blood pressure is more likely to develop in: ? People who have blood pressure in the high end of the normal range (130-139/85-89 mm Hg). ? People who are overweight or obese. ? People who are African American.  If you are 53-23 years of age, have your blood pressure checked every 3-5 years. If you are 81 years of age or older, have your blood pressure checked every year. You should have your blood pressure measured twice-once when you are at a hospital or clinic, and once when you are not at a hospital or clinic. Record the average of the two measurements. To check your blood pressure when you are not at a hospital or clinic, you can use: ? An automated blood pressure machine at a pharmacy. ? A home blood pressure monitor.  If you are between 43 years and 76 years old, ask your health care provider if you should take aspirin to prevent strokes.  Have regular diabetes screenings. This involves taking a blood sample  to check your fasting blood sugar level. ? If you are at a normal weight and have a low risk for diabetes, have this test once every three years after 40 years of age. ? If you are overweight and have a high risk for diabetes, consider being tested at a younger age or more often. Preventing infection Hepatitis B  If you have a higher risk for hepatitis B, you should be screened for this virus. You are considered at high risk for hepatitis B if: ? You were born in a country where hepatitis B is common. Ask your health care provider which countries are considered high risk. ? Your parents were born in a high-risk country, and you have not been immunized against hepatitis B (hepatitis B vaccine). ? You have HIV or AIDS. ? You use needles to inject street drugs. ? You live with someone who has hepatitis B. ? You have had sex with someone who has hepatitis B. ? You get hemodialysis treatment. ? You take certain medicines for conditions, including cancer, organ transplantation, and autoimmune conditions.  Hepatitis C  Blood testing is recommended for: ? Everyone born from [birth year] through 1963. ? Anyone with known risk factors for hepatitis C.  Sexually transmitted infections (STIs)  You should be screened for sexually transmitted infections (STIs) including gonorrhea and chlamydia if: ? You are sexually active and are younger than 40 years of age. ? You are older than 40 years of age and your health care provider tells you that you are at risk for this type of infection. ? Your sexual activity has changed since you were last screened and you are at an increased risk for chlamydia or gonorrhea. Ask your health care provider if you are at risk.  If you do not have HIV, but are at risk, it may be recommended that you take a prescription medicine daily to prevent HIV infection. This is called pre-exposure prophylaxis (PrEP). You are considered at risk if: ? You are sexually active and do not  regularly use condoms or know the HIV status of your partner(s). ? You take drugs by injection. ? You are sexually active with a partner who has HIV.  Talk with your health care provider about whether you are at high risk of being infected with HIV. If you choose to begin PrEP, you should first be tested for HIV. You should then be tested every 3 months for as long as you are taking PrEP.  Pregnancy  If you are premenopausal and you may become pregnant, ask your health care provider about preconception counseling.  If you may become pregnant, take 400 to 800 micrograms (mcg) of folic acid every day.  If you want to prevent pregnancy, talk to your health care provider about birth control (contraception). Osteoporosis and menopause  Osteoporosis is a disease in which the bones lose minerals and strength with aging. This can result in serious bone fractures. Your risk for osteoporosis can be identified using a bone density scan.  If you are 37 years of age or older, or if you are at risk for osteoporosis and fractures, ask your health care provider if you should be screened.  Ask your health care provider whether you should take a calcium or vitamin D supplement to lower your risk for osteoporosis.  Menopause may have certain physical symptoms and risks.  Hormone replacement therapy may reduce some of these symptoms and risks. Talk to your health care provider about whether hormone replacement therapy is right for you. Follow these instructions at home:  Schedule regular health, dental, and eye exams.  Stay current with your immunizations.  Do not use any tobacco products including cigarettes, chewing tobacco, or electronic cigarettes.  If you are pregnant, do not drink alcohol.  If you are breastfeeding, limit how much and how often you drink alcohol.  Limit alcohol intake to no more than 1 drink per day for nonpregnant women. One drink equals 12 ounces of beer, 5 ounces of wine, or  1 ounces of hard liquor.  Do not use street drugs.  Do not share needles.  Ask your health care provider for help if you need support or information about quitting drugs.  Tell your health care provider if you often feel depressed.  Tell your health care provider if you have ever been abused or do not feel safe at home. This information is not intended to replace advice given to you by your health care provider. Make sure you discuss any questions you have with your health care provider. Document Released: 02/21/2011 Document Revised: 01/14/2016 Document Reviewed: 05/12/2015 Elsevier Interactive Patient Education  [employer].

## 2018-05-24 ENCOUNTER — Encounter (HOSPITAL_COMMUNITY): Payer: Self-pay | Admitting: Licensed Clinical Social Worker

## 2018-05-24 ENCOUNTER — Ambulatory Visit (INDEPENDENT_AMBULATORY_CARE_PROVIDER_SITE_OTHER): Payer: PRIVATE HEALTH INSURANCE | Admitting: Licensed Clinical Social Worker

## 2018-05-24 DIAGNOSIS — F4323 Adjustment disorder with mixed anxiety and depressed mood: Secondary | ICD-10-CM

## 2018-05-24 NOTE — Progress Notes (Signed)
   THERAPIST PROGRESS NOTE  Session Time: 10:00am-11:00am  Participation Level: Active  Behavioral Response: Well GroomedAlertDepressed  Type of Therapy: Individual Therapy  Treatment Goals addressed: Coping  Interventions: CBT and Motivational Interviewing  Summary: [name] is a 42 y.o. female who presents with Adjustment Disorder with mixed anxiety and depressed mood.   Suicidal/Homicidal: Nowithout intent/plan  Therapist Response: Novaleigh engaged well in CCA. She reports increased depression and anxiety over the past month due to many significant changes, both at work and home. Zabdi reports concerns about her husband's health, as he has recently been dx with Diabetes. Lashonda also reports she has insomnia and has been experiencing a lot of stress at work. She and her husband moved a few months ago. She has also experienced some behavior problems with 58 year old daughter.   Plan: Return again in 3-4 weeks. Refer to psychiatry at [facility].   Diagnosis: Axis I: Adjustment Disorder with mixed anxiety and depressed mood    Veneda Melter, LCSW 05/24/2018
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
## 2018-05-31 ENCOUNTER — Telehealth: Payer: Self-pay | Admitting: Family Medicine

## 2018-05-31 ENCOUNTER — Encounter: Payer: Self-pay | Admitting: Family Medicine

## 2018-05-31 NOTE — Telephone Encounter (Signed)
Pt dropped fmla paperwork  She stated this was to go to counseling with Meredith Leeds and she has an appointment [facility].  She stated at first she will be going once a week. She wanted this to start today 05/31/18 to 06/01/19.  Paperwork in dr g in box

## 2018-06-08 NOTE — Telephone Encounter (Signed)
Left message on vm per dpr relaying Dr. G's message.  

## 2018-06-08 NOTE — Telephone Encounter (Signed)
I can fill out without appointment but do want recommendations from [facility] - can she send me request with recommended time off? Normally 4 hours per appt so would be 8 hours per week if she has 2 scheduled.

## 2018-06-18 ENCOUNTER — Ambulatory Visit (INDEPENDENT_AMBULATORY_CARE_PROVIDER_SITE_OTHER): Payer: 59 | Admitting: Internal Medicine

## 2018-06-18 ENCOUNTER — Ambulatory Visit (HOSPITAL_COMMUNITY): Payer: Self-pay | Admitting: Licensed Clinical Social Worker

## 2018-06-18 ENCOUNTER — Encounter: Payer: Self-pay | Admitting: Internal Medicine

## 2018-06-18 ENCOUNTER — Ambulatory Visit (INDEPENDENT_AMBULATORY_CARE_PROVIDER_SITE_OTHER)
Admission: RE | Admit: 2018-06-18 | Discharge: 2018-06-18 | Disposition: A | Discharge status: Home / Self Care | Payer: 59 | Source: Ambulatory Visit | Attending: Internal Medicine | Admitting: Internal Medicine

## 2018-06-18 VITALS — BP 120/78 | HR 87 | Temp 97.9°F | Wt 174.0 lb

## 2018-06-18 DIAGNOSIS — M79671 Pain in right foot: Secondary | ICD-10-CM

## 2018-06-18 NOTE — Patient Instructions (Signed)
RICE for Routine Care of Injuries Many injuries can be cared for using rest, ice, compression, and elevation (RICE therapy). Using RICE therapy can help to lessen pain and swelling. It can help your body to heal. Rest Reduce your normal activities and avoid using the injured part of your body. You can go back to your normal activities when you feel okay and your doctor says it is okay. Ice Do not put ice on your bare skin.  Put ice in a plastic bag.  Place a towel between your skin and the bag.  Leave the ice on for 20 minutes, 2-3 times a day.  Do this for as long as told by your doctor. Compression Compression means putting pressure on the injured area. This can be done with an elastic bandage. If an elastic bandage has been applied:  Remove and reapply the bandage every 3-4 hours or as told by your doctor.  Make sure the bandage is not wrapped too tight. Wrap the bandage more loosely if part of your body beyond the bandage is blue, swollen, cold, painful, or loses feeling (numb).  See your doctor if the bandage seems to make your problems worse.  Elevation Elevation means keeping the injured area raised. Raise the injured area above your heart or the center of your chest if you can. When should I get help? You should get help if:  You keep having pain and swelling.  Your symptoms get worse.  Get help right away if: You should get help right away if:  You have sudden bad pain at or below the area of your injury.  You have redness or more swelling around your injury.  You have tingling or numbness at or below the injury that does not go away when you take off the bandage.  This information is not intended to replace advice given to you by your health care provider. Make sure you discuss any questions you have with your health care provider. Document Released: 01/25/2008 Document Revised: 07/05/2016 Document Reviewed: 07/16/2014 Elsevier Interactive Patient Education  2017  Elsevier Inc.  

## 2018-06-18 NOTE — Progress Notes (Signed)
Subjective:    Patient ID: [name], female    DOB: May 06, 1976, 42 y.o.   MRN: [id number]  HPI  Pt presents to the clinic today with c/o right foot and ankle pain. She reports this started last night. She was walking down the steps and missed the last step. She describes the pain as sharp and shooting. The pain radiates up her lower leg. She is having difficulty with weight bearing. She denies swelling or bruising to the area. She denies numbness or tingling of the right foot. She has tried Ibuprofen with some relief.  Review of Systems      Past Medical History:  Diagnosis Date  . Diverticulosis of colon   . History of abnormal cervical Pap smear    age 45  . History of asthma    childhood  . History of diverticulitis of colon 11/2012  . Hypertension   . Miscarriage 01/2018  . Missed ab   . Protein C deficiency (HCC)     Current Outpatient Medications  Medication Sig Dispense Refill  . albuterol (PROVENTIL HFA;VENTOLIN HFA) 108 (90 Base) MCG/ACT inhaler Inhale 2 puffs into the lungs every 6 (six) hours as needed for wheezing or shortness of breath. 1 Inhaler 0  . amLODipine (NORVASC) 2.5 MG tablet Take 1 tablet (2.5 mg total) by mouth daily. 90 tablet 3  . cetirizine (ZYRTEC) 10 MG tablet Take 10 mg by mouth daily. As needed    . hydrOXYzine (ATARAX/VISTARIL) 10 MG tablet Take 1-2 tablets (10-20 mg total) by mouth 2 (two) times daily as needed for anxiety (sedation precautions). 40 tablet 3  . vitamin B-12 (CYANOCOBALAMIN) 1000 MCG tablet Take 1 tablet (1,000 mcg total) by mouth daily.     No current facility-administered medications for this visit.     Allergies  Allergen Reactions  . Other Hives    Eggplant, pecans, corn, walnuts, tobacco    Family History  Problem Relation Age of Onset  . Hypertension Mother   . Hypertension Father   . Diabetes Father   . Cancer Maternal Aunt 50       breast/colon  . Colon cancer Maternal Aunt   . Stroke Paternal  Grandmother   . Prostate cancer Paternal Grandfather   . Pulmonary embolism Cousin 28       pulm embolism    Social History   Socioeconomic History  . Marital status: Married    Spouse name: Not on file  . Number of children: 1  . Years of education: Not on file  . Highest education level: Not on file  Occupational History  . Occupation: Nutritional therapist: [employer]  Social Needs  . Financial resource strain: Not on file  . Food insecurity:    Worry: Not on file    Inability: Not on file  . Transportation needs:    Medical: Not on file    Non-medical: Not on file  Tobacco Use  . Smoking status: Never Smoker  . Smokeless tobacco: Never Used  Substance and Sexual Activity  . Alcohol use: Yes    Comment: social  . Drug use: No  . Sexual activity: Yes    Partners: Male    Birth control/protection: Condom  Lifestyle  . Physical activity:    Days per week: Not on file    Minutes per session: Not on file  . Stress: Not on file  Relationships  . Social connections:    Talks on phone:  Not on file    Gets together: Not on file    Attends religious service: Not on file    Active member of club or organization: Not on file    Attends meetings of clubs or organizations: Not on file    Relationship status: Not on file  . Intimate partner violence:    Fear of current or ex partner: Not on file    Emotionally abused: Not on file    Physically abused: Not on file    Forced sexual activity: Not on file  Other Topics Concern  . Not on file  Social History Narrative   Lives with 1 daughter (2006)   Occupation: customer service   Edu: some college   Activity: walks some   Diet: good water, fruits/vegetables daily     Constitutional: Denies fever, malaise, fatigue, headache or abrupt weight changes.  Musculoskeletal: Pt reports right foot and ankle pain, difficulty with gait. Denies decrease in range of motion, muscle pain or joint swelling.  Skin: Denies  redness, rashes, lesions or ulcercations.   No other specific complaints in a complete review of systems (except as listed in HPI above).  Objective:   Physical Exam   BP 120/78   Pulse 87   Temp 97.9 F (36.6 C) (Oral)   Wt 174 lb (78.9 kg)   LMP 06/15/2018   SpO2 98%   BMI 29.41 kg/m  Wt Readings from Last 3 Encounters:  06/18/18 174 lb (78.9 kg)  05/15/18 172 lb 8 oz (78.2 kg)  04/18/18 173 lb (78.5 kg)    General: Appears her stated age, well developed, well nourished in NAD. Skin: Warm, dry and intact. No swelling or bruising noted. Cardiovascular: Pedal pulse 2+ bilaterally. Musculoskeletal: Normal flexion, extension and rotation of the right ankle. No pain with palpation of the right ankle. No joint swelling noted. Normal dorsiflexion, plantarflexion of the right foot. Pain with resistance to dorsiflexion. Pain with palpation over the distal/mid 2-4th metatarsals, right foot. Limps with normal gait. Unable to walk on tiptoes due to pain. Able to walk on heels. Neurological: Alert and oriented. Sensation intact to BLE.   BMET    Component Value Date/Time   NA 137 05/07/2018 0823   NA 140 03/15/2017 0844   K 3.7 05/07/2018 0823   CL 104 05/07/2018 0823   CO2 25 05/07/2018 0823   GLUCOSE 83 05/07/2018 0823   BUN 17 05/07/2018 0823   BUN 11 03/15/2017 0844   CREATININE 0.88 05/07/2018 0823   CREATININE 0.80 01/15/2014 0944   CALCIUM 8.9 05/07/2018 0823   GFRNONAA >60 06/18/2017 1343   GFRAA >60 06/18/2017 1343    Lipid Panel     Component Value Date/Time   CHOL 164 05/07/2018 0823   CHOL 180 03/15/2017 0844   TRIG 43.0 05/07/2018 0823   HDL 59.70 05/07/2018 0823   HDL 60 03/15/2017 0844   CHOLHDL 3 05/07/2018 0823   VLDL 8.6 05/07/2018 0823   LDLCALC 96 05/07/2018 0823   LDLCALC 108 (H) 03/15/2017 0844    CBC    Component Value Date/Time   WBC 5.4 05/15/2018 1432   RBC 4.63 05/15/2018 1432   HGB 13.9 05/15/2018 1432   HCT 41.5 05/15/2018 1432    PLT 279.0 05/15/2018 1432   MCV 89.5 05/15/2018 1432   MCH 28.9 06/18/2017 1343   MCHC 33.6 05/15/2018 1432   RDW 13.5 05/15/2018 1432   LYMPHSABS 1.6 05/15/2018 1432   MONOABS 0.3 05/15/2018 1432  EOSABS 0.1 05/15/2018 1432   BASOSABS 0.1 05/15/2018 1432    Hgb A1C Lab Results  Component Value Date   HGBA1C 5.2 03/15/2017           Assessment & Plan:   Right Foot Pain:  No obviously abnormality Xray today to r/o fracture Discussed RICE therapy Encouraged Ibuprofen 600 mg TID prn  Will follow up after xray, return precautions discussed Nicki Reaper, NP

## 2018-07-16 ENCOUNTER — Ambulatory Visit (INDEPENDENT_AMBULATORY_CARE_PROVIDER_SITE_OTHER): Payer: 59 | Admitting: Family Medicine

## 2018-07-16 ENCOUNTER — Encounter: Payer: Self-pay | Admitting: Family Medicine

## 2018-07-16 VITALS — BP 120/70 | HR 82 | Temp 98.2°F | Ht 64.5 in | Wt 177.0 lb

## 2018-07-16 DIAGNOSIS — S93402A Sprain of unspecified ligament of left ankle, initial encounter: Secondary | ICD-10-CM | POA: Insufficient documentation

## 2018-07-16 DIAGNOSIS — S93402D Sprain of unspecified ligament of left ankle, subsequent encounter: Secondary | ICD-10-CM | POA: Diagnosis not present

## 2018-07-16 NOTE — Patient Instructions (Signed)
I think you do have R ankle sprain - treat with ASO lace up brace as tolerated especially when you're on your feet a lot.  May continue ibuprofen, ice, elevation Do exercises provided today.  Ankle sprains can take 4-6 weeks to heal.

## 2018-07-16 NOTE — Assessment & Plan Note (Signed)
Anticipate L ATFL sprain. Slow recovery. Discussed can take 4-6 wks to fully heal. Exercises provided today. Continue ASO brace especially with prolonged ambulation, standing. Update if not continuing to heal. Pt agrees with plan.

## 2018-07-16 NOTE — Progress Notes (Signed)
BP 120/70 (BP Location: Left Arm, Patient Position: Sitting, Cuff Size: Normal)   Pulse 82   Temp 98.2 F (36.8 C) (Oral)   Ht 5' 4.5" (1.638 m)   Wt 177 lb (80.3 kg)   LMP 06/15/2018   SpO2 97%   BMI 29.91 kg/m    CC: R ankle pain Subjective:    Patient ID: [name], female    DOB: 02/09/1976, 42 y.o.   MRN: [id number]  HPI: [name] is a 42 y.o. female presenting on 07/16/2018 for Ankle Pain (C/o right ankle pain and swelling for about 3 wks. Says x-ray showed no fx but still having pain/swelling. )   H/o R ankle injury while walking down stairs on 06/17/2018. Seen by Rene Kocher, note reviewed. Ankle films at that time were normal. Rec ibuprofen and RICE therapy. This helps temporarily. Some improvement from last week. Ongoing anterior ankle pain and some arch pain now. Notes some swelling as well. Tried ankle braces - lace up and sleeve.   Relevant past medical, surgical, family and social history reviewed and updated as indicated. Interim medical history since our last visit reviewed. Allergies and medications reviewed and updated. Outpatient Medications Prior to Visit  Medication Sig Dispense Refill  . albuterol (PROVENTIL HFA;VENTOLIN HFA) 108 (90 Base) MCG/ACT inhaler Inhale 2 puffs into the lungs every 6 (six) hours as needed for wheezing or shortness of breath. 1 Inhaler 0  . amLODipine (NORVASC) 2.5 MG tablet Take 1 tablet (2.5 mg total) by mouth daily. 90 tablet 3  . cetirizine (ZYRTEC) 10 MG tablet Take 10 mg by mouth daily. As needed    . hydrOXYzine (ATARAX/VISTARIL) 10 MG tablet Take 1-2 tablets (10-20 mg total) by mouth 2 (two) times daily as needed for anxiety (sedation precautions). 40 tablet 3  . vitamin B-12 (CYANOCOBALAMIN) 1000 MCG tablet Take 1 tablet (1,000 mcg total) by mouth daily.     No facility-administered medications prior to visit.      Per HPI unless specifically indicated in ROS section below Review of Systems     Objective:    BP  120/70 (BP Location: Left Arm, Patient Position: Sitting, Cuff Size: Normal)   Pulse 82   Temp 98.2 F (36.8 C) (Oral)   Ht 5' 4.5" (1.638 m)   Wt 177 lb (80.3 kg)   LMP 06/15/2018   SpO2 97%   BMI 29.91 kg/m   Wt Readings from Last 3 Encounters:  07/16/18 177 lb (80.3 kg)  06/18/18 174 lb (78.9 kg)  05/15/18 172 lb 8 oz (78.2 kg)    Physical Exam  Constitutional: She appears well-developed and well-nourished. No distress.  Cardiovascular: Normal rate, regular rhythm and normal heart sounds.  No murmur heard. Musculoskeletal: Normal range of motion. She exhibits no edema.  1+ DP bilaterally L foot WNL R foot - tender at lateral ankle at ATLF. No pain with palpation of malleoli, no pain at base of 5th MT, no pain with calcaneal squeeze, no pain at MT shafts  Nursing note and vitals reviewed.      Assessment & Plan:   Problem List Items Addressed This Visit    Left ankle sprain - Primary    Anticipate L ATFL sprain. Slow recovery. Discussed can take 4-6 wks to fully heal. Exercises provided today. Continue ASO brace especially with prolonged ambulation, standing. Update if not continuing to heal. Pt agrees with plan.           No orders of the  defined types were placed in this encounter.  No orders of the defined types were placed in this encounter.   Follow up plan: Return if symptoms worsen or fail to improve.  Eustaquio BoydenJavier Claudetta Sallie, MD

## 2018-07-18 ENCOUNTER — Other Ambulatory Visit: Payer: Self-pay

## 2018-07-18 MED ORDER — FLUCONAZOLE 150 MG PO TABS: 150.0000 mg | ORAL_TABLET | Freq: Once | ORAL | 0 refills | Status: AC

## 2018-07-18 NOTE — Telephone Encounter (Signed)
Patient would like to have a diflucan called into her pharamcy.

## 2018-09-26 IMAGING — US US OB TRANSVAGINAL
1 series · 15 of 28 positions shown · non-contrast
Comparison: 07/22/2016

CLINICAL DATA: Follow-up nonviable pregnancy, heavy bleeding

EXAM:
TRANSVAGINAL OB ULTRASOUND
TECHNIQUE: Transvaginal ultrasound was performed for complete evaluation of the
gestation as well as the maternal uterus, adnexal regions, and
pelvic cul-de-sac.

[Series 1: us ob transvaginal · 37 acquisitions, 15 frames shown]
[im 1/37]
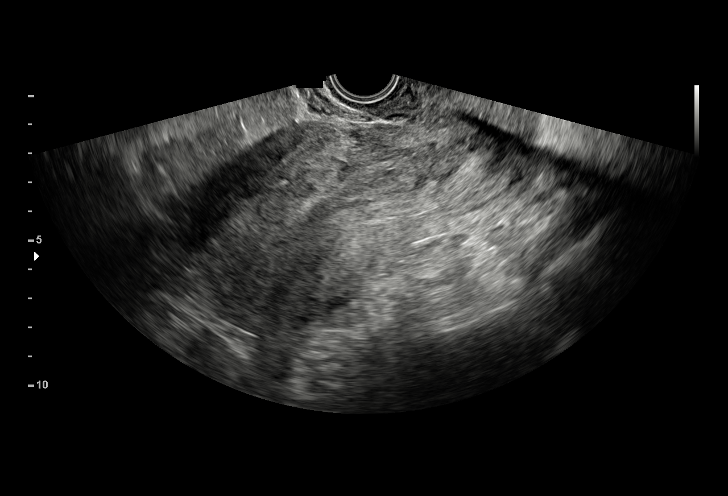
[im 3/37]
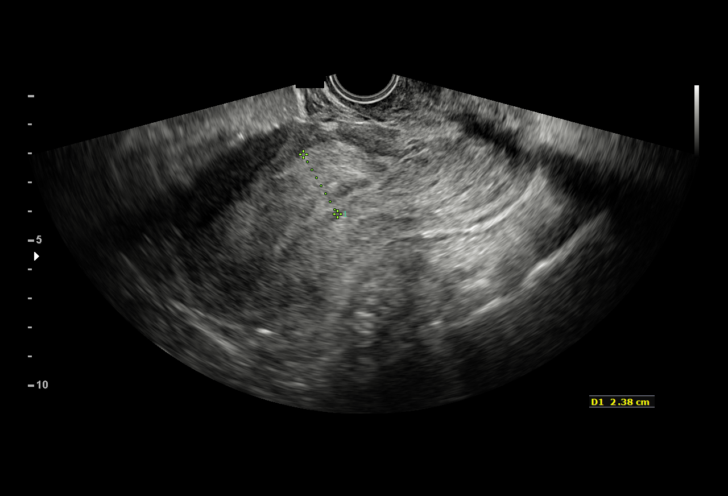
[im 6/37]
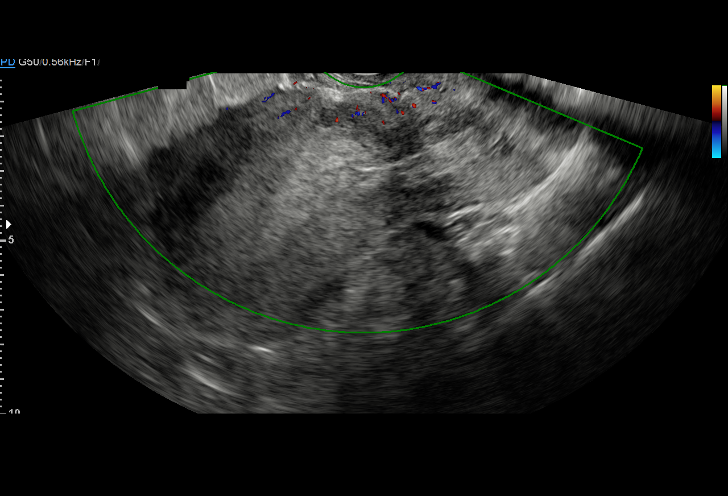
[im 9/37]
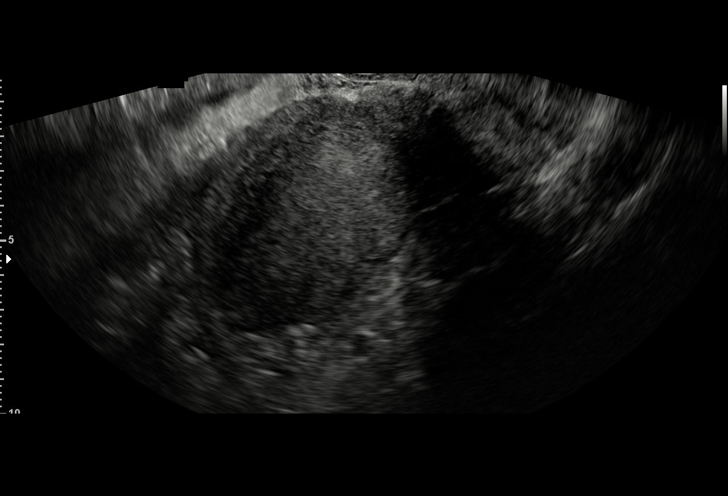
[im 11/37]
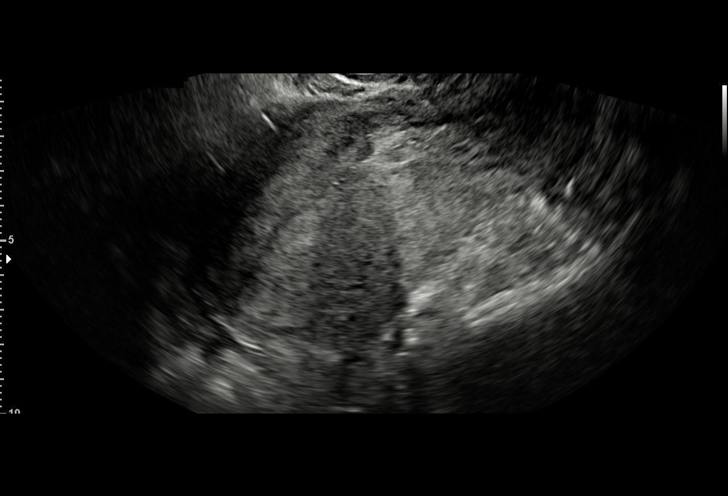
[im 14/37]
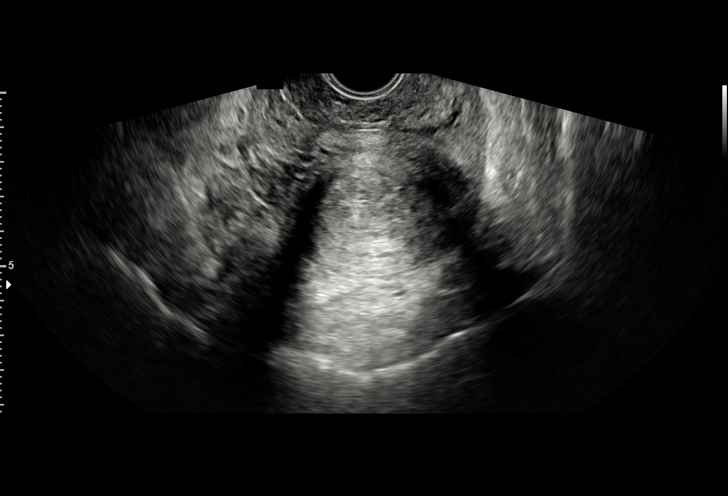
[im 17/37]
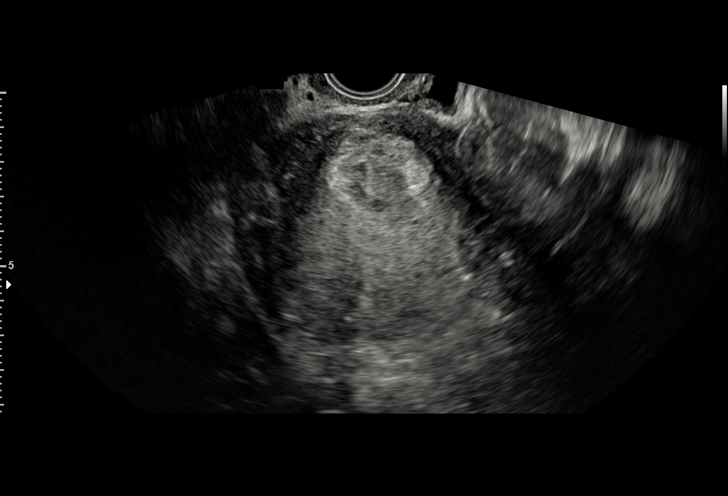
[im 19/37]
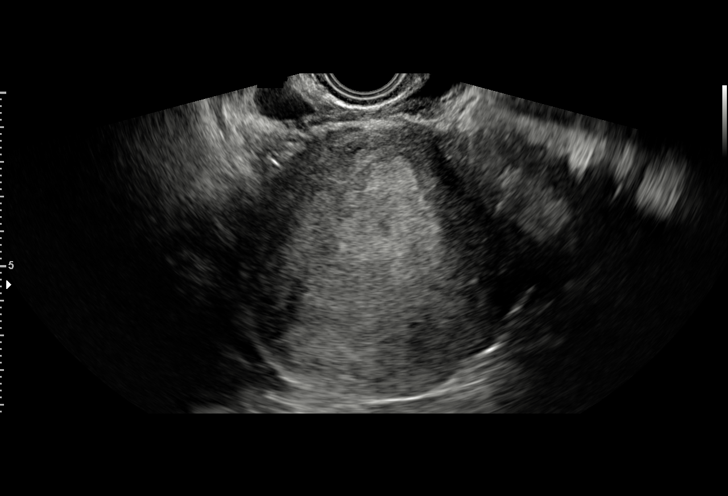
[im 21/37]
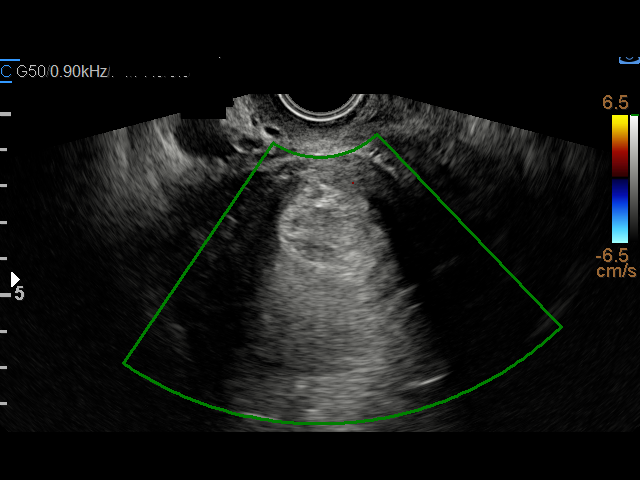
[im 23/37]
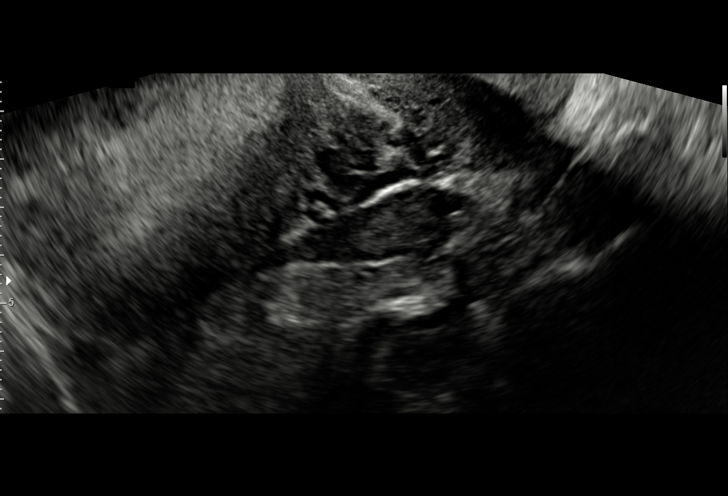
[im 26/37]
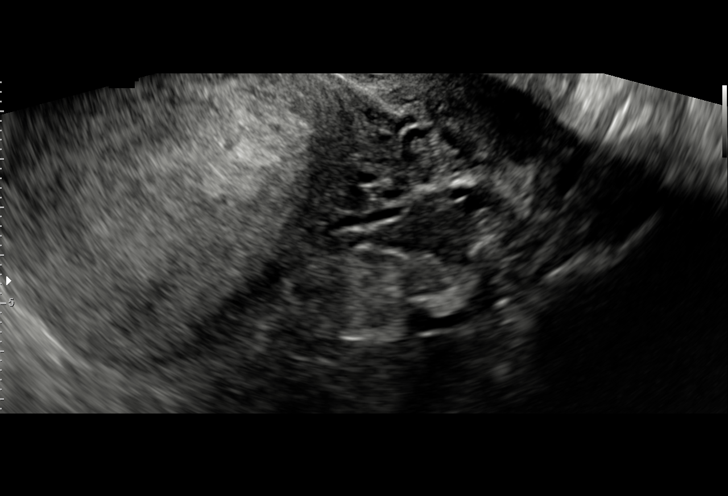
[im 29/37]
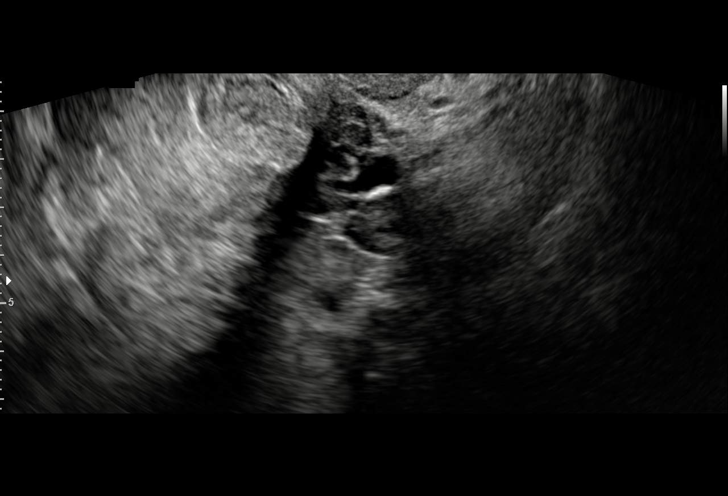
[im 31/37]
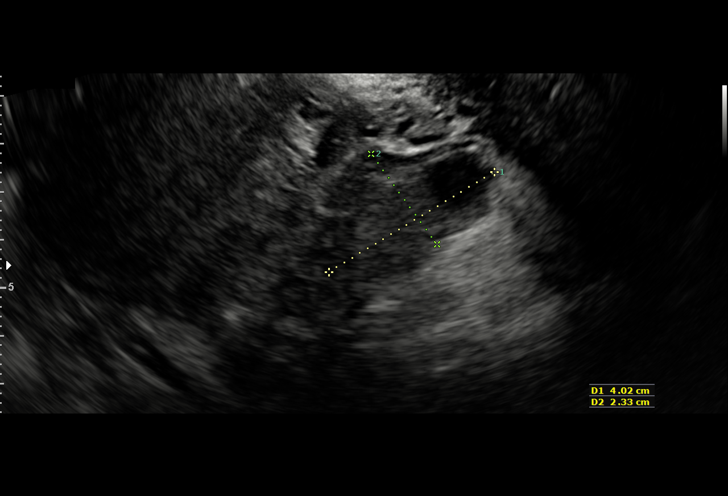
[im 34/37]
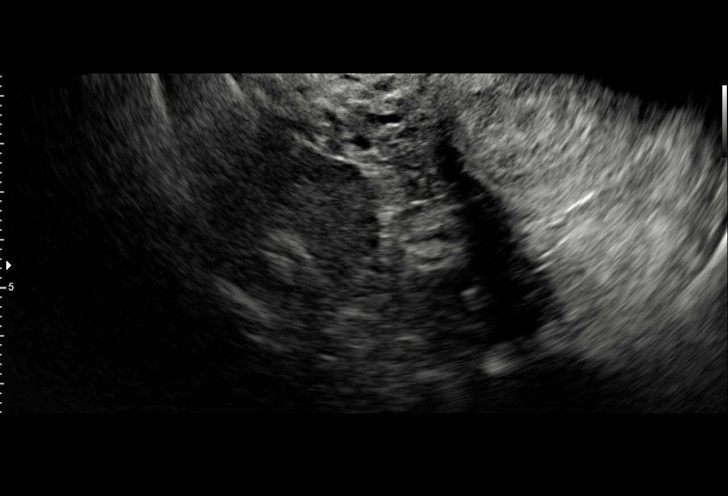
[im 37/37]
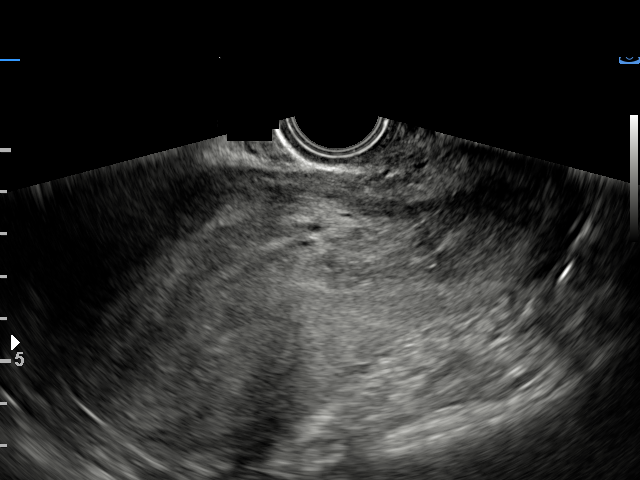

[15 of 28 positions shown; findings below may reference images not displayed]

FINDINGS: Intrauterine gestational sac: None

Subchorionic hemorrhage:  None visualized.

Maternal uterus/adnexae: Endometrial complex is
thickened/heterogeneous, measuring 24 mm. No focal color Doppler
flow.

Bilateral ovaries are within normal limits.

No free fluid.
IMPRESSION: No IUP is visualized.

Endometrial complex is thickened/heterogeneous, measuring 24 mm. No
focal color Doppler flow.

Overall clinical appearance favors missed abortion with blood
products in the endometrial cavity. No findings to suggest
vascularized products of conception. Correlate with beta HCG.

If symptoms persist, consider repeat pelvic ultrasound in 7-10 days.

## 2018-10-01 ENCOUNTER — Other Ambulatory Visit: Payer: Self-pay

## 2018-10-01 MED ORDER — FLUCONAZOLE 150 MG PO TABS: 150.0000 mg | ORAL_TABLET | Freq: Once | ORAL | 1 refills | Status: DC

## 2018-10-01 NOTE — Telephone Encounter (Signed)
Patient has vaginal discharge and she thinks she has a yeast infection. She would like to have a diflucan called into her pharmacy.

## 2018-10-04 ENCOUNTER — Encounter: Payer: Self-pay | Admitting: Family Medicine

## 2018-10-04 ENCOUNTER — Ambulatory Visit (INDEPENDENT_AMBULATORY_CARE_PROVIDER_SITE_OTHER): Payer: BLUE CROSS/BLUE SHIELD | Admitting: Family Medicine

## 2018-10-04 VITALS — BP 122/74 | HR 97 | Temp 98.9°F | Ht 64.5 in | Wt 174.4 lb

## 2018-10-04 DIAGNOSIS — J029 Acute pharyngitis, unspecified: Secondary | ICD-10-CM | POA: Diagnosis not present

## 2018-10-04 DIAGNOSIS — R509 Fever, unspecified: Secondary | ICD-10-CM | POA: Insufficient documentation

## 2018-10-04 LAB — POC INFLUENZA A&B (BINAX/QUICKVUE)
INFLUENZA B, POC: NEGATIVE
Influenza A, POC: NEGATIVE

## 2018-10-04 LAB — POCT RAPID STREP A (OFFICE): Rapid Strep A Screen: NEGATIVE

## 2018-10-04 MED ORDER — AMOXICILLIN-POT CLAVULANATE 875-125 MG PO TABS: 1.0000 | ORAL_TABLET | Freq: Two times a day (BID) | ORAL | 0 refills | Status: DC

## 2018-10-04 NOTE — Assessment & Plan Note (Signed)
Signs of tonsillitis on exam (L side)  Neg strep and flu tests  Pending throat cx  Empiric coverage with augmentin based on exam

## 2018-10-04 NOTE — Assessment & Plan Note (Signed)
With intermittent fever  Neg RST and flu test  Focal L tonsil findings (exudate and erythema) -w/o suspicion of peritonsillar abscess at this time Will empirically cover with augmentin  Pending throat cx Update if not starting to improve in a week or if worsening  (esp worse throat pain or swallowing problems)

## 2018-10-04 NOTE — Progress Notes (Addendum)
Subjective:    Patient ID: [name], female    DOB: 10/30/1975, 43 y.o.   MRN: [id number]  HPI 43 yo pt of D G here with fever/ST and swollen tonsil  Started on Tuesday   L tonsil has white dc on it /swollen This is sore Has had ST as well  Hurts to swallow but can swallow ok   Fever - (suspected) - cold hands and feet  Some chills  A little achey   Strep test is negative   Took ibuprofen at 3 am   No headache  No cough  Has had flu before w/o cough however   No rash   No known exposure to strep or flu   Patient Active Problem List   Diagnosis Date Noted  . Sore throat 10/04/2018  . Fever 10/04/2018  . Left ankle sprain 07/16/2018  . Health maintenance examination 05/15/2018  . Baker's cyst of knee, right 04/18/2018  . Stressful life events affecting family and household 10/18/2017  . Hematuria 10/18/2017  . Chest congestion 10/03/2017  . Paresthesias in left hand 10/03/2017  . H/O abnormal cervical Papanicolaou smear 03/15/2017  . Hypertension, essential 02/16/2017  . 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 History of classical cesarean section 02/17/2015  . Diverticulitis of colon 11/29/2012  . Protein C deficiency (HCC)    Past Medical History:  Diagnosis Date  . Diverticulosis of colon   . History of abnormal cervical Pap smear    age 43  . History of asthma    childhood  . History of diverticulitis of colon 11/2012  . Hypertension   . Miscarriage 01/2018  . Missed ab   . Protein C deficiency [facility](HCC)    Past Surgical History:  Procedure Laterality Date  . CESAREAN SECTION  06/08/00  . CESAREAN SECTION  11/05/04  . COLONOSCOPY  12/2012   mod diverticulosis Arlyce Dice([place])  . COLPOSCOPY  2013  . GYNECOLOGIC CRYOSURGERY  1999   Social History   Tobacco Use  . Smoking status: Never Smoker  . Smokeless tobacco: Never Used  Substance Use Topics  . Alcohol use: Yes    Comment: social  . Drug use: No   Family History  Problem Relation Age of Onset  .  Hypertension Mother   . Hypertension Father   . Diabetes Father   . Cancer Maternal Aunt 50       breast/colon  . Colon cancer Maternal Aunt   . Stroke Paternal Grandmother   . Prostate cancer Paternal Grandfather   . Pulmonary embolism Cousin 28       pulm embolism   Allergies  Allergen Reactions  . Other Hives    Eggplant, pecans, corn, walnuts, tobacco   Current Outpatient Medications on File Prior to Visit  Medication Sig Dispense Refill  . albuterol (PROVENTIL HFA;VENTOLIN HFA) 108 (90 Base) MCG/ACT inhaler Inhale 2 puffs into the lungs every 6 (six) hours as needed for wheezing or shortness of breath. 1 Inhaler 0  . amLODipine (NORVASC) 2.5 MG tablet Take 1 tablet (2.5 mg total) by mouth daily. 90 tablet 3  . cetirizine (ZYRTEC) 10 MG tablet Take 10 mg by mouth daily. As needed    . hydrOXYzine (ATARAX/VISTARIL) 10 MG tablet Take 1-2 tablets (10-20 mg total) by mouth 2 (two) times daily as needed for anxiety (sedation precautions). 40 tablet 3  . vitamin B-12 (CYANOCOBALAMIN) 1000 MCG tablet Take 1 tablet (1,000 mcg total) by mouth daily.     No current facility-administered  medications on file prior to visit.     Review of Systems  Constitutional: Positive for fatigue and fever. Negative for activity change, appetite change and unexpected weight change.  HENT: Positive for sore throat. Negative for congestion, ear pain, rhinorrhea, sinus pressure and voice change.   Eyes: Negative for pain, redness and visual disturbance.  Respiratory: Negative for cough, chest tightness, shortness of breath and wheezing.   Cardiovascular: Negative for chest pain and palpitations.  Gastrointestinal: Negative for abdominal pain, blood in stool, constipation and diarrhea.  Endocrine: Negative for polydipsia and polyuria.  Genitourinary: Negative for urgency.  Musculoskeletal: Negative for arthralgias, back pain and myalgias.  Skin: Negative for pallor and rash.  Allergic/Immunologic:  Negative for environmental allergies.  Neurological: Negative for dizziness, syncope and headaches.  Hematological: Negative for adenopathy. Does not bruise/bleed easily.  Psychiatric/Behavioral: Negative for decreased concentration and dysphoric mood. The patient is not nervous/anxious.        Objective:   Physical Exam Constitutional:      General: She is not in acute distress.    Appearance: She is well-developed. She is not ill-appearing.  HENT:     Head: Normocephalic and atraumatic.     Right Ear: Tympanic membrane normal.     Left Ear: Tympanic membrane and ear canal normal.     Nose: No congestion or rhinorrhea.     Comments: pnd noted -clear     Mouth/Throat:     Mouth: Mucous membranes are moist.     Pharynx: Uvula midline. Oropharyngeal exudate and posterior oropharyngeal erythema present. No pharyngeal swelling or uvula swelling.     Tonsils: Tonsillar exudate present. No tonsillar abscesses. Swelling: 1+ on the left.     Comments: Swollen L tonsil more than R Exudate on L only Eyes:     General:        Right eye: No discharge.        Left eye: No discharge.     Extraocular Movements:     Left eye: Normal extraocular motion.     Conjunctiva/sclera: Conjunctivae normal.     Pupils: Pupils are equal, round, and reactive to light.  Neck:     Musculoskeletal: No neck rigidity or muscular tenderness.  Cardiovascular:     Rate and Rhythm: Regular rhythm. Tachycardia present.  Pulmonary:     Effort: Pulmonary effort is normal. No respiratory distress.     Breath sounds: Normal breath sounds. No wheezing or rales.  Lymphadenopathy:     Cervical: No cervical adenopathy.  Skin:    General: Skin is warm and dry.     Findings: No rash.  Neurological:     General: No focal deficit present.  Psychiatric:        Mood and Affect: Mood normal.           Assessment & Plan:   Problem List Items Addressed This Visit      Other   Sore throat - Primary    With  intermittent fever  Neg RST and flu test  Focal L tonsil findings (exudate and erythema) -w/o suspicion of peritonsillar abscess at this time Will empirically cover with augmentin  Pending throat cx Update if not starting to improve in a week or if worsening  (esp worse throat pain or swallowing problems)       Relevant Orders   Rapid Strep A (Completed)   Culture, Group A Strep   Fever    Signs of tonsillitis on exam (L side)  Neg  strep and flu tests  Pending throat cx  Empiric coverage with augmentin based on exam       Relevant Orders   Culture, Group A Strep   POC Influenza A&B(BINAX/QUICKVUE) (Completed)

## 2018-10-04 NOTE — Patient Instructions (Signed)
Strep and flu tests are negative Take the augmentin for tonsillitis  We will alert you when throat cx result returns   Drink fluids Salt water gargles Tylenol or ibuprofen for fever or pain   Update if not starting to improve in a week or if worsening

## 2018-10-06 LAB — CULTURE, GROUP A STREP
MICRO NUMBER:: 191863
SPECIMEN QUALITY:: ADEQUATE

## 2018-11-13 ENCOUNTER — Other Ambulatory Visit: Payer: Self-pay | Admitting: Family Medicine

## 2018-11-13 ENCOUNTER — Encounter: Payer: Self-pay | Admitting: Family Medicine

## 2018-11-13 ENCOUNTER — Encounter: Payer: Self-pay | Admitting: Physician Assistant

## 2018-11-13 ENCOUNTER — Telehealth: Payer: BLUE CROSS/BLUE SHIELD | Admitting: Physician Assistant

## 2018-11-13 DIAGNOSIS — R0602 Shortness of breath: Secondary | ICD-10-CM

## 2018-11-13 DIAGNOSIS — R079 Chest pain, unspecified: Secondary | ICD-10-CM

## 2018-11-13 MED ORDER — ALBUTEROL SULFATE HFA 108 (90 BASE) MCG/ACT IN AERS: 2.0000 | INHALATION_SPRAY | Freq: Four times a day (QID) | RESPIRATORY_TRACT | 0 refills | Status: DC | PRN

## 2018-11-13 NOTE — Telephone Encounter (Signed)
I refilled inhaler before reading this message.

## 2018-11-13 NOTE — Progress Notes (Signed)
Based on what you shared with me, I feel your condition warrants further evaluation and I recommend that you be seen for a face to face office visit.  [name],  As per our conversation, you have stated that you shortness of breath and chest pain that radiates to your back. You have denied any cough and stated that you were prescribed Albuterol due to "lung issues". You have also stated that you have childhood Asthma, none diagnosed now. Due to these symptoms, I recommend following up for a face to face evaluation.    NOTE: If you entered your credit card information for this eVisit, you will not be charged. You may see a "hold" on your card for the $35 but that hold will drop off and you will not have a charge processed.  If you are having a true medical emergency please call 911.  If you need an urgent face to face visit, [facility] has four urgent care centers for your convenience.    PLEASE NOTE: THE [facility] LOCATIONS AND URGENT CARE CLINICS DO NOT HAVE THE TESTING FOR CORONAVIRUS COVID19 AVAILABLE.  IF YOU FEEL YOU NEED THIS TEST YOU MUST HAVE AN ORDER TO GO TO A TESTING LOCATION FROM YOUR PROVIDER OR FROM A SCREENING E-VISIT     [email or web address] to reserve your spot online an avoid wait times  [facility] [address] [place], [place] [address] 8 am to 8 pm Monday-Friday 10 am to 4 pm Saturday-Sunday *Across the street from [employer]  [address] [place], [address] 8 am to 5 pm Monday-Friday * In the [facility] on the [facility]   The following sites will take your insurance:  . [facility]  [phone] Get Driving Directions Find a Provider at this Location  [address] [place], [place] [address] . 10 am to 8 pm Monday-Friday . 12 pm to 8 pm Saturday-Sunday   . [facility] at [facility]  [phone] Get Driving Directions Find a  Provider at this Location  [address] [place], [place] [address] . 8 am to 8 pm Monday-Friday . 9 am to 6 pm Saturday . 11 am to 6 pm Sunday   . [facility] Urgent Care at [facility]  [phone] Get Driving Directions  [address] [place], [place] [address] . 8 am to 8 pm Monday-Friday . 8 am to 4 pm Saturday-Sunday   Your e-visit answers were reviewed by a board certified advanced clinical practitioner to complete your personal care plan.  Thank you for using e-Visits.  I have spent 7 min in completion and review of this note- [facility]

## 2019-05-27 ENCOUNTER — Other Ambulatory Visit: Payer: Self-pay

## 2019-05-27 ENCOUNTER — Encounter: Payer: Self-pay | Admitting: Obstetrics & Gynecology

## 2019-05-27 ENCOUNTER — Other Ambulatory Visit (HOSPITAL_COMMUNITY)
Admission: RE | Admit: 2019-05-27 | Discharge: 2019-05-27 | Disposition: A | Discharge status: Home / Self Care | Payer: BLUE CROSS/BLUE SHIELD | Source: Ambulatory Visit | Attending: Obstetrics & Gynecology | Admitting: Obstetrics & Gynecology

## 2019-05-27 ENCOUNTER — Ambulatory Visit (INDEPENDENT_AMBULATORY_CARE_PROVIDER_SITE_OTHER): Payer: Medicaid Other | Admitting: Obstetrics & Gynecology

## 2019-05-27 VITALS — BP 126/78 | HR 78 | Wt 171.2 lb

## 2019-05-27 DIAGNOSIS — Z01419 Encounter for gynecological examination (general) (routine) without abnormal findings: Secondary | ICD-10-CM | POA: Insufficient documentation

## 2019-05-27 DIAGNOSIS — Z0000 Encounter for general adult medical examination without abnormal findings: Secondary | ICD-10-CM | POA: Diagnosis not present

## 2019-05-27 DIAGNOSIS — R350 Frequency of micturition: Secondary | ICD-10-CM

## 2019-05-27 DIAGNOSIS — Z1231 Encounter for screening mammogram for malignant neoplasm of breast: Secondary | ICD-10-CM

## 2019-05-27 LAB — POCT URINE QUALITATIVE DIPSTICK BLOOD: Blood, UA: POSITIVE

## 2019-05-27 NOTE — Progress Notes (Signed)
GYNECOLOGY ANNUAL PREVENTATIVE CARE ENCOUNTER NOTE  History:     [name] is a 43 y.o. [phone] female here for a routine annual gynecologic exam.  Current complaints: increased urinary frequency around the time of her period, always goes away after a couple of days. No dysuria, no fevers, no abdominal pain. Just started period today, wants urine analyzed.   Denies abnormal vaginal bleeding, discharge, pelvic pain, problems with intercourse or other gynecologic concerns.    Gynecologic History Patient's last menstrual period was 05/27/2019. Contraception: none Last Pap: 03/15/2017. Results were: normal with negative HPV  Obstetric History OB History  Gravida Para Term Preterm AB Living  4 2 1 1 2 1   SAB TAB Ectopic Multiple Live Births  1 1 0 0 2    # Outcome Date GA Lbr Len/2nd Weight Sex Delivery Anes PTL Lv  4 SAB 08/18/16 [redacted]w[redacted]d    SAB     3 TAB 03/06/15 [redacted]w[redacted]d    TAB     2 Term 11/05/04 [redacted]w[redacted]d  6 lb 12 oz (3.062 kg) F CS-Unspec   LIV  1 Preterm 06/08/00 [redacted]w[redacted]d  14.9 oz (0.423 kg) F CS-Classical Spinal  DEC    Past Medical History:  Diagnosis Date  . Diverticulosis of colon   . History of abnormal cervical Pap smear    age 89  . History of asthma    childhood  . History of diverticulitis of colon 11/2012  . Hypertension   . Miscarriage 01/2018  . Missed ab   . Protein C deficiency [facility])     Past Surgical History:  Procedure Laterality Date  . CESAREAN SECTION  06/08/00  . CESAREAN SECTION  11/05/04  . COLONOSCOPY  12/2012   mod diverticulosis Deatra Ina)  . COLPOSCOPY  2013  . GYNECOLOGIC CRYOSURGERY  1999    Current Outpatient Medications on File Prior to Visit  Medication Sig Dispense Refill  . albuterol (PROVENTIL HFA;VENTOLIN HFA) 108 (90 Base) MCG/ACT inhaler Inhale 2 puffs into the lungs every 6 (six) hours as needed for wheezing or shortness of breath. 1 Inhaler 0  . amLODipine (NORVASC) 2.5 MG tablet Take 1 tablet (2.5 mg total) by mouth daily. 90 tablet  3  . cetirizine (ZYRTEC) 10 MG tablet Take 10 mg by mouth daily. As needed    . hydrOXYzine (ATARAX/VISTARIL) 10 MG tablet Take 1-2 tablets (10-20 mg total) by mouth 2 (two) times daily as needed for anxiety (sedation precautions). 40 tablet 3  . vitamin B-12 (CYANOCOBALAMIN) 1000 MCG tablet Take 1 tablet (1,000 mcg total) by mouth daily.    Marland Kitchen loteprednol (LOTEMAX) 0.5 % ophthalmic suspension PLACE 1 DROP INTO LEFT EYE AT BEDTIME FOR 14 DAYS     No current facility-administered medications on file prior to visit.     Allergies  Allergen Reactions  . Other Hives    Eggplant, pecans, corn, walnuts, tobacco    Social History:  reports that she has never smoked. She has never used smokeless tobacco. She reports current alcohol use. She reports that she does not use drugs.  Family History  Problem Relation Age of Onset  . Hypertension Mother   . Hypertension Father   . Diabetes Father   . Cancer Maternal Aunt 50       breast/colon  . Colon cancer Maternal Aunt   . Stroke Paternal Grandmother   . Prostate cancer Paternal Grandfather   . Pulmonary embolism Cousin 28       pulm embolism  The following portions of the patient's history were reviewed and updated as appropriate: allergies, current medications, past family history, past medical history, past social history, past surgical history and problem list.  Review of Systems Pertinent items noted in HPI and remainder of comprehensive ROS otherwise negative.  Physical Exam:  BP 126/78   Pulse 78   Wt 171 lb 3.2 oz (77.7 kg)   LMP 05/27/2019   BMI 28.93 kg/m  CONSTITUTIONAL: Well-developed, well-nourished female in no acute distress.  HENT:  Normocephalic, atraumatic, External right and left ear normal. Oropharynx is clear and moist EYES: Conjunctivae and EOM are normal. Pupils are equal, round, and reactive to light. No scleral icterus.  NECK: Normal range of motion, supple, no masses.  Normal thyroid.  SKIN: Skin is warm and  dry. No rash noted. Not diaphoretic. No erythema. No pallor. MUSCULOSKELETAL: Normal range of motion. No tenderness.  No cyanosis, clubbing, or edema.  2+ distal pulses. NEUROLOGIC: Alert and oriented to person, place, and time. Normal reflexes, muscle tone coordination. No cranial nerve deficit noted. PSYCHIATRIC: Normal mood and affect. Normal behavior. Normal judgment and thought content. CARDIOVASCULAR: Normal heart rate noted, regular rhythm RESPIRATORY: Clear to auscultation bilaterally. Effort and breath sounds normal, no problems with respiration noted. BREASTS: Symmetric in size. No masses, skin changes, nipple drainage, or lymphadenopathy. ABDOMEN: Soft, normal bowel sounds, no distention noted.  No tenderness, rebound or guarding.  PELVIC: Normal appearing external genitalia; normal appearing vaginal mucosa and cervix.  Bloody discharge noted.  Pap smear obtained.  Normal uterine size, no other palpable masses, no uterine or adnexal tenderness.   Assessment and Plan:    1. Urine frequency Negative urine dipstick. Likely due to uterine inflammation pre-period which can also affect adjacent organ, especially in an anteverted/anteflexed uterus. Has two previous cesarean sections; there also can be adhesive disease making the uterus closer to the bladder. No intervention needed for this for now, patient reassured.  - POCT urine qual dipstick blood  2. Breast cancer screening by mammogram Mammogram scheduled - MM 3D SCREEN BREAST BILATERAL; Future  3. Encounter for gynecological examination with Papanicolaou smear of cervix - Cytology - PAP( [place]) Will follow up results of pap smear and manage accordingly. Routine preventative health maintenance measures emphasized. Please refer to After Visit Summary for other counseling recommendations.      Jaynie Collins, MD, FACOG Obstetrician & Gynecologist, [facility] for [employer], [facility]

## 2019-05-27 NOTE — Patient Instructions (Signed)

## 2019-06-03 LAB — CYTOLOGY - PAP
Diagnosis: NEGATIVE
High risk HPV: NEGATIVE

## 2019-06-14 ENCOUNTER — Other Ambulatory Visit: Payer: Self-pay | Admitting: Obstetrics & Gynecology

## 2019-07-03 ENCOUNTER — Other Ambulatory Visit: Payer: Self-pay | Admitting: Family Medicine

## 2019-07-03 NOTE — Telephone Encounter (Signed)
E-scribed refill.  Pls schedule cpe and lab visits. 

## 2019-07-04 NOTE — Telephone Encounter (Signed)
Left voicemail for pt to call back and schedule cpe/labs.

## 2019-07-10 ENCOUNTER — Encounter: Payer: Self-pay | Admitting: Family Medicine

## 2019-07-10 NOTE — Telephone Encounter (Signed)
2nd attempt mailed letter for pt to call us and schedule cpe

## 2019-09-28 ENCOUNTER — Other Ambulatory Visit: Payer: Self-pay | Admitting: Family Medicine

## 2019-11-01 ENCOUNTER — Other Ambulatory Visit: Payer: Self-pay

## 2019-11-01 ENCOUNTER — Other Ambulatory Visit (INDEPENDENT_AMBULATORY_CARE_PROVIDER_SITE_OTHER): Payer: 59

## 2019-11-01 ENCOUNTER — Other Ambulatory Visit: Payer: Self-pay | Admitting: Family Medicine

## 2019-11-01 DIAGNOSIS — R319 Hematuria, unspecified: Secondary | ICD-10-CM

## 2019-11-01 DIAGNOSIS — R202 Paresthesia of skin: Secondary | ICD-10-CM | POA: Diagnosis not present

## 2019-11-01 DIAGNOSIS — I10 Essential (primary) hypertension: Secondary | ICD-10-CM

## 2019-11-01 LAB — COMPREHENSIVE METABOLIC PANEL
ALT: 9 U/L (ref 0–35)
AST: 11 U/L (ref 0–37)
Albumin: 4 g/dL (ref 3.5–5.2)
Alkaline Phosphatase: 55 U/L (ref 39–117)
BUN: 12 mg/dL (ref 6–23)
CO2: 25 mEq/L (ref 19–32)
Calcium: 8.6 mg/dL (ref 8.4–10.5)
Chloride: 105 mEq/L (ref 96–112)
Creatinine, Ser: 0.83 mg/dL (ref 0.40–1.20)
GFR: 91.47 mL/min (ref >60.00)
Glucose, Bld: 86 mg/dL (ref 70–99)
Potassium: 3.7 mEq/L (ref 3.5–5.1)
Sodium: 136 mEq/L (ref 135–145)
Total Bilirubin: 0.6 mg/dL (ref 0.2–1.2)
Total Protein: 7.7 g/dL (ref 6.0–8.3)

## 2019-11-01 LAB — LIPID PANEL
Cholesterol: 169 mg/dL (ref 0–200)
HDL: 56.5 mg/dL (ref >39.00)
LDL Cholesterol: 103 mg/dL — ABNORMAL HIGH (ref 0–99)
NonHDL: 112.58
Total CHOL/HDL Ratio: 3
Triglycerides: 46 mg/dL (ref 0.0–149.0)
VLDL: 9.2 mg/dL (ref 0.0–40.0)

## 2019-11-01 LAB — VITAMIN B12: Vitamin B-12: 937 pg/mL — ABNORMAL HIGH (ref 211–911)

## 2019-11-01 NOTE — Addendum Note (Signed)
Addended by: Alvina Chou on: 11/01/2019 07:49 AM   Modules accepted: Orders

## 2019-11-04 ENCOUNTER — Other Ambulatory Visit (INDEPENDENT_AMBULATORY_CARE_PROVIDER_SITE_OTHER): Payer: 59

## 2019-11-04 DIAGNOSIS — R319 Hematuria, unspecified: Secondary | ICD-10-CM | POA: Diagnosis not present

## 2019-11-04 LAB — URINALYSIS, ROUTINE W REFLEX MICROSCOPIC
Bilirubin Urine: NEGATIVE
Ketones, ur: NEGATIVE
Leukocytes,Ua: NEGATIVE
Nitrite: NEGATIVE
Specific Gravity, Urine: 1.02 (ref 1.000–1.030)
Total Protein, Urine: NEGATIVE
Urine Glucose: NEGATIVE
Urobilinogen, UA: 0.2 (ref 0.0–1.0)
pH: 6.5 (ref 5.0–8.0)

## 2019-11-06 ENCOUNTER — Encounter: Payer: Self-pay | Admitting: Family Medicine

## 2019-11-06 ENCOUNTER — Other Ambulatory Visit: Payer: Self-pay

## 2019-11-06 ENCOUNTER — Ambulatory Visit (INDEPENDENT_AMBULATORY_CARE_PROVIDER_SITE_OTHER): Payer: 59 | Admitting: Family Medicine

## 2019-11-06 VITALS — BP 134/86 | HR 90 | Temp 97.8°F | Ht 64.75 in | Wt 167.2 lb

## 2019-11-06 DIAGNOSIS — D6859 Other primary thrombophilia: Secondary | ICD-10-CM

## 2019-11-06 DIAGNOSIS — R202 Paresthesia of skin: Secondary | ICD-10-CM

## 2019-11-06 DIAGNOSIS — I10 Essential (primary) hypertension: Secondary | ICD-10-CM | POA: Diagnosis not present

## 2019-11-06 DIAGNOSIS — Z0000 Encounter for general adult medical examination without abnormal findings: Secondary | ICD-10-CM

## 2019-11-06 MED ORDER — MULTIVITAMIN ADULT PO TABS: 1.0000 | ORAL_TABLET | Freq: Every day | ORAL | Status: AC

## 2019-11-06 MED ORDER — CYANOCOBALAMIN 500 MCG PO TABS
500.0000 ug | ORAL_TABLET | ORAL | Status: DC
Start: 2019-11-06 — End: 2020-12-29

## 2019-11-06 MED ORDER — AMLODIPINE BESYLATE 2.5 MG PO TABS: 2.5000 mg | ORAL_TABLET | Freq: Every day | ORAL | 4 refills | Status: DC

## 2019-11-06 MED ORDER — FLUCONAZOLE 150 MG PO TABS: ORAL_TABLET | ORAL | 0 refills | Status: DC

## 2019-11-06 NOTE — Assessment & Plan Note (Signed)
No h/o blood clots.

## 2019-11-06 NOTE — Progress Notes (Signed)
This visit was conducted in person.  BP 134/86 (BP Location: Left Arm, Patient Position: Sitting, Cuff Size: Normal)   Pulse 90   Temp 97.8 F (36.6 C) (Temporal)   Ht 5' 4.75" (1.645 m)   Wt 167 lb 3 oz (75.8 kg)   LMP 10/30/2019   SpO2 98%   BMI 28.04 kg/m    CC: CPE Subjective:    Patient ID: [name], female    DOB: 1976-03-15, 44 y.o.   MRN: [id number]  HPI: [name] is a 44 y.o. female presenting on 11/06/2019 for Annual Exam   H/o protein C deficiency.   Preventative: COLONOSCOPY 12/2012 - mod diverticulosis Arlyce Dice) Well woman exam with OBGYN last saw Dr Macon Large with normal pap 05/2019.  LMP 10/30/2019  Breast cancer screening - planning to schedule - 1/2 aunt with h/o breast cancer DEXA scan - not due  Flu shot - declines Tdap 04/2017 Covid vaccine - interested  Pneumonia shot - not due Shingrix - not due Seat belt use discussed Sunscreen use discussed. No changing moles on skin.  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 Interim medical history since our last visit reviewed. Allergies and medications reviewed and updated. Outpatient Medications Prior to Visit  Medication Sig Dispense Refill  . albuterol (PROVENTIL HFA;VENTOLIN HFA) 108 (90 Base) MCG/ACT inhaler Inhale 2 puffs into the lungs every 6 (six) hours as needed for wheezing or shortness of breath. 1 Inhaler 0  . cetirizine (ZYRTEC) 10 MG tablet Take 10 mg by mouth daily. As needed    . loteprednol (LOTEMAX) 0.5 % ophthalmic suspension PLACE 1 DROP INTO LEFT EYE AT BEDTIME FOR 14 DAYS    . amLODipine (NORVASC) 2.5 MG tablet  TAKE 1 TABLET BY MOUTH EVERY DAY 90 tablet 0  . vitamin B-12 (CYANOCOBALAMIN) 1000 MCG tablet Take 1 tablet (1,000 mcg total) by mouth daily.    . fluconazole (DIFLUCAN) 150 MG tablet TAKE 1 TABLET BY MOUTH AS ONE DOSE 2 tablet 1  . hydrOXYzine (ATARAX/VISTARIL) 10 MG tablet Take 1-2 tablets (10-20 mg total) by mouth 2 (two) times daily as needed for anxiety (sedation precautions). 40 tablet 3   No facility-administered medications prior to visit.     Per HPI unless specifically indicated in ROS section below Review of Systems  Constitutional: Negative for activity change, appetite change, chills, fatigue, fever and unexpected weight change.  HENT: Negative for hearing loss.   Eyes: Negative for visual disturbance.  Respiratory: Negative for cough, chest tightness, shortness of breath and wheezing.   Cardiovascular: Negative for chest pain, palpitations and leg swelling.  Gastrointestinal: Negative for abdominal distention, abdominal pain, blood in stool, constipation, diarrhea, nausea and vomiting.  Genitourinary: Negative for difficulty urinating and hematuria.  Musculoskeletal: Negative for arthralgias, myalgias and neck pain.  Skin: Negative for rash.  Neurological: Negative for dizziness, seizures, syncope and headaches.  Hematological: Negative for adenopathy. Does not bruise/bleed easily.  Psychiatric/Behavioral: Negative for dysphoric mood. The patient is not nervous/anxious.    Objective:    BP 134/86 (BP Location: Left Arm, Patient Position: Sitting, Cuff Size: Normal)   Pulse 90  Temp 97.8 F (36.6 C) (Temporal)   Ht 5' 4.75" (1.645 m)   Wt 167 lb 3 oz (75.8 kg)   LMP 10/30/2019   SpO2 98%   BMI 28.04 kg/m   Wt Readings from Last 3 Encounters:  11/06/19 167 lb 3 oz (75.8 kg)  05/27/19 171 lb 3.2 oz (77.7 kg)  10/04/18 174 lb 6 oz (79.1 kg)    Physical Exam Vitals and nursing note reviewed.  Constitutional:      General: She is not in acute distress.     Appearance: Normal appearance. She is well-developed. She is not ill-appearing.  HENT:     Head: Normocephalic and atraumatic.     Right Ear: Hearing, tympanic membrane, ear canal and external ear normal.     Left Ear: Hearing, tympanic membrane, ear canal and external ear normal.     Mouth/Throat:     Pharynx: Uvula midline.  Eyes:     General: No scleral icterus.    Extraocular Movements: Extraocular movements intact.     Conjunctiva/sclera: Conjunctivae normal.     Pupils: Pupils are equal, round, and reactive to light.  Cardiovascular:     Rate and Rhythm: Normal rate and regular rhythm.     Pulses: Normal pulses.          Radial pulses are 2+ on the right side and 2+ on the left side.     Heart sounds: Normal heart sounds. No murmur.  Pulmonary:     Effort: Pulmonary effort is normal. No respiratory distress.     Breath sounds: Normal breath sounds. No wheezing, rhonchi or rales.  Abdominal:     General: Abdomen is flat. Bowel sounds are normal. There is no distension.     Palpations: Abdomen is soft. There is no mass.     Tenderness: There is no abdominal tenderness. There is no guarding or rebound.     Hernia: No hernia is present.  Musculoskeletal:        General: Normal range of motion.     Cervical back: Normal range of motion and neck supple.     Right lower leg: No edema.     Left lower leg: No edema.  Lymphadenopathy:     Cervical: No cervical adenopathy.  Skin:    General: Skin is warm and dry.     Findings: No rash.  Neurological:     General: No focal deficit present.     Mental Status: She is alert and oriented to person, place, and time.     Comments: CN grossly intact, station and gait intact  Psychiatric:        Mood and Affect: Mood normal.        Behavior: Behavior normal.        Thought Content: Thought content normal.        Judgment: Judgment normal.       Results for orders placed or performed in visit on 11/04/19  Urinalysis, Routine w reflex  microscopic  Result Value Ref Range   Color, Urine YELLOW Yellow;Lt. Yellow;Straw;Dark Yellow;Amber;Green;Red;Brown   APPearance CLEAR Clear;Turbid;Slightly Cloudy;Cloudy   Specific Gravity, Urine 1.020 1.000 - 1.030   pH 6.5 5.0 - 8.0   Total Protein, Urine NEGATIVE Negative   Urine Glucose NEGATIVE Negative   Ketones, ur NEGATIVE Negative   Bilirubin Urine NEGATIVE Negative   Hgb urine dipstick MODERATE (A) Negative   Urobilinogen, UA 0.2 0.0 - 1.0   Leukocytes,Ua NEGATIVE Negative   Nitrite NEGATIVE Negative  WBC, UA 0-2/hpf 0-2/hpf   RBC / HPF 0-2/hpf 0-2/hpf   Squamous Epithelial / LPF Rare(0-4/hpf) Rare(0-4/hpf)   Assessment & Plan:  This visit occurred during the SARS-CoV-2 public health emergency.  Safety protocols were in place, including screening questions prior to the visit, additional usage of staff PPE, and extensive cleaning of exam room while observing appropriate contact time as indicated for disinfecting solutions.   Problem List Items Addressed This Visit    Protein C deficiency (HCC)    No h/o blood clots.       Paresthesias in left hand    Feels this has improved with b12 replacement.       Hypertension, essential    Chronic, stable. Continue current regimen.       Relevant Medications   amLODipine (NORVASC) 2.5 MG tablet   Health maintenance examination - Primary    Preventative protocols reviewed and updated unless pt declined. Discussed healthy diet and lifestyle.           Meds ordered this encounter  Medications  . amLODipine (NORVASC) 2.5 MG tablet    Sig: Take 1 tablet (2.5 mg total) by mouth daily.    Dispense:  90 tablet    Refill:  4  . vitamin B-12 (V-R VITAMIN B-12) 500 MCG tablet    Sig: Take 1 tablet (500 mcg total) by mouth every Monday, Wednesday, and Friday.  . fluconazole (DIFLUCAN) 150 MG tablet    Sig: TAKE 1 TABLET BY MOUTH AS ONE DOSE    Dispense:  2 tablet    Refill:  0  . Multiple Vitamin (MULTIVITAMIN ADULT) TABS      Sig: Take 1 tablet by mouth daily.   No orders of the defined types were placed in this encounter.   Patient instructions: b12 levels were actually on the high side. You can do b12 supplement a few times a week or if very regular with multivitamin, you may not need extra b12.  You are doing well today. Return as needed or in 1 year for next physical.   Follow up plan: Return in about 1 year (around 11/05/2020) for annual exam, prior fasting for blood work.  Eustaquio Boyden, MD

## 2019-11-06 NOTE — Assessment & Plan Note (Signed)
Preventative protocols reviewed and updated unless pt declined. Discussed healthy diet and lifestyle.  

## 2019-11-06 NOTE — Patient Instructions (Signed)
b12 levels were actually on the high side. You can do 518mcg b12 supplement a few times a week or if very regular with multivitamin, you may not need extra b12.  You are doing well today. Return as needed or in 1 year for next physical.   Health Maintenance, Female Adopting a healthy lifestyle and getting preventive care are important in promoting health and wellness. Ask your health care provider about:  The right schedule for you to have regular tests and exams.  Things you can do on your own to prevent diseases and keep yourself healthy. What should I know about diet, weight, and exercise? Eat a healthy diet   Eat a diet that includes plenty of vegetables, fruits, low-fat dairy products, and lean protein.  Do not eat a lot of foods that are high in solid fats, added sugars, or sodium. Maintain a healthy weight Body mass index (BMI) is used to identify weight problems. It estimates body fat based on height and weight. Your health care provider can help determine your BMI and help you achieve or maintain a healthy weight. Get regular exercise Get regular exercise. This is one of the most important things you can do for your health. Most adults should:  Exercise for at least 150 minutes each week. The exercise should increase your heart rate and make you sweat (moderate-intensity exercise).  Do strengthening exercises at least twice a week. This is in addition to the moderate-intensity exercise.  Spend less time sitting. Even light physical activity can be beneficial. Watch cholesterol and blood lipids Have your blood tested for lipids and cholesterol at 42 years of age, then have this test every 5 years. Have your cholesterol levels checked more often if:  Your lipid or cholesterol levels are high.  You are older than 42 years of age.  You are at high risk for heart disease. What should I know about cancer screening? Depending on your health history and family history, you may  need to have cancer screening at various ages. This may include screening for:  Breast cancer.  Cervical cancer.  Colorectal cancer.  Skin cancer.  Lung cancer. What should I know about heart disease, diabetes, and high blood pressure? Blood pressure and heart disease  High blood pressure causes heart disease and increases the risk of stroke. This is more likely to develop in people who have high blood pressure readings, are of African descent, or are overweight.  Have your blood pressure checked: ? Every 3-5 years if you are 12-74 years of age. ? Every year if you are 32 years old or older. Diabetes Have regular diabetes screenings. This checks your fasting blood sugar level. Have the screening done:  Once every three years after age 77 if you are at a normal weight and have a low risk for diabetes.  More often and at a younger age if you are overweight or have a high risk for diabetes. What should I know about preventing infection? Hepatitis B If you have a higher risk for hepatitis B, you should be screened for this virus. Talk with your health care provider to find out if you are at risk for hepatitis B infection. Hepatitis C Testing is recommended for:  Everyone born from [birth year] through 1965.  Anyone with known risk factors for hepatitis C. Sexually transmitted infections (STIs)  Get screened for STIs, including gonorrhea and chlamydia, if: ? You are sexually active and are younger than 42 years of age. ? You are older  than 42 years of age and your health care provider tells you that you are at risk for this type of infection. ? Your sexual activity has changed since you were last screened, and you are at increased risk for chlamydia or gonorrhea. Ask your health care provider if you are at risk.  Ask your health care provider about whether you are at high risk for HIV. Your health care provider may recommend a prescription medicine to help prevent HIV infection. If you  choose to take medicine to prevent HIV, you should first get tested for HIV. You should then be tested every 3 months for as long as you are taking the medicine. Pregnancy  If you are about to stop having your period (premenopausal) and you may become pregnant, seek counseling before you get pregnant.  Take 400 to 800 micrograms (mcg) of folic acid every day if you become pregnant.  Ask for birth control (contraception) if you want to prevent pregnancy. Osteoporosis and menopause Osteoporosis is a disease in which the bones lose minerals and strength with aging. This can result in bone fractures. If you are 47 years old or older, or if you are at risk for osteoporosis and fractures, ask your health care provider if you should:  Be screened for bone loss.  Take a calcium or vitamin D supplement to lower your risk of fractures.  Be given hormone replacement therapy (HRT) to treat symptoms of menopause. Follow these instructions at home: Lifestyle  Do not use any products that contain nicotine or tobacco, such as cigarettes, e-cigarettes, and chewing tobacco. If you need help quitting, ask your health care provider.  Do not use street drugs.  Do not share needles.  Ask your health care provider for help if you need support or information about quitting drugs. Alcohol use  Do not drink alcohol if: ? Your health care provider tells you not to drink. ? You are pregnant, may be pregnant, or are planning to become pregnant.  If you drink alcohol: ? Limit how much you use to 0-1 drink a day. ? Limit intake if you are breastfeeding.  Be aware of how much alcohol is in your drink. In the [place] one drink equals one 12 oz bottle of beer (355 mL), one 5 oz glass of wine (148 mL), or one 1 oz glass of hard liquor (44 mL). General instructions  Schedule regular health, dental, and eye exams.  Stay current with your vaccines.  Tell your health care provider if: ? You often feel  depressed. ? You have ever been abused or do not feel safe at home. Summary  Adopting a healthy lifestyle and getting preventive care are important in promoting health and wellness.  Follow your health care provider's instructions about healthy diet, exercising, and getting tested or screened for diseases.  Follow your health care provider's instructions on monitoring your cholesterol and blood pressure. This information is not intended to replace advice given to you by your health care provider. Make sure you discuss any questions you have with your health care provider. Document Revised: 08/01/2018 Document Reviewed: 08/01/2018 Elsevier Patient Education  2020 [employer].

## 2019-11-06 NOTE — Assessment & Plan Note (Addendum)
Chronic, stable. Continue current regimen. 

## 2019-11-06 NOTE — Assessment & Plan Note (Signed)
Feels this has improved with b12 replacement.

## 2020-05-01 ENCOUNTER — Encounter: Payer: Self-pay | Admitting: Radiology

## 2020-10-11 ENCOUNTER — Other Ambulatory Visit: Payer: Self-pay | Admitting: Family Medicine

## 2020-10-12 NOTE — Telephone Encounter (Signed)
Pharmacy requests refill on: Fluconazole 150 mg   LAST REFILL: 11/06/2019 (Q-2, R-0) LAST OV: 11/06/2019 NEXT OV: Not Scheduled  PHARMACY: [id number] [place], [place]   Called and spoke with patient who stated that she has a yeast infection and is requesting medication for this.

## 2020-12-03 ENCOUNTER — Ambulatory Visit: Payer: 59 | Admitting: Obstetrics & Gynecology

## 2020-12-21 ENCOUNTER — Other Ambulatory Visit: Payer: Self-pay | Admitting: Family Medicine

## 2020-12-29 ENCOUNTER — Other Ambulatory Visit: Payer: Self-pay

## 2020-12-29 ENCOUNTER — Ambulatory Visit (INDEPENDENT_AMBULATORY_CARE_PROVIDER_SITE_OTHER): Payer: 59 | Admitting: Family Medicine

## 2020-12-29 ENCOUNTER — Encounter: Payer: Self-pay | Admitting: Family Medicine

## 2020-12-29 VITALS — BP 124/82 | HR 100 | Temp 97.5°F | Ht 65.0 in | Wt 169.6 lb

## 2020-12-29 DIAGNOSIS — E663 Overweight: Secondary | ICD-10-CM | POA: Diagnosis not present

## 2020-12-29 DIAGNOSIS — Z0000 Encounter for general adult medical examination without abnormal findings: Secondary | ICD-10-CM

## 2020-12-29 DIAGNOSIS — D6859 Other primary thrombophilia: Secondary | ICD-10-CM

## 2020-12-29 DIAGNOSIS — I10 Essential (primary) hypertension: Secondary | ICD-10-CM

## 2020-12-29 LAB — COMPREHENSIVE METABOLIC PANEL
ALT: 12 U/L (ref 0–35)
AST: 17 U/L (ref 0–37)
Albumin: 4.2 g/dL (ref 3.5–5.2)
Alkaline Phosphatase: 53 U/L (ref 39–117)
BUN: 14 mg/dL (ref 6–23)
CO2: 28 mEq/L (ref 19–32)
Calcium: 9 mg/dL (ref 8.4–10.5)
Chloride: 102 mEq/L (ref 96–112)
Creatinine, Ser: 0.8 mg/dL (ref 0.40–1.20)
GFR: 90.76 mL/min (ref >60.00)
Glucose, Bld: 89 mg/dL (ref 70–99)
Potassium: 4.2 mEq/L (ref 3.5–5.1)
Sodium: 137 mEq/L (ref 135–145)
Total Bilirubin: 1.1 mg/dL (ref 0.2–1.2)
Total Protein: 7.5 g/dL (ref 6.0–8.3)

## 2020-12-29 LAB — TSH: TSH: 1.19 u[IU]/mL (ref 0.35–4.50)

## 2020-12-29 MED ORDER — ALBUTEROL SULFATE HFA 108 (90 BASE) MCG/ACT IN AERS: 2.0000 | INHALATION_SPRAY | Freq: Four times a day (QID) | RESPIRATORY_TRACT | 3 refills | Status: DC | PRN

## 2020-12-29 MED ORDER — AMLODIPINE BESYLATE 2.5 MG PO TABS: 2.5000 mg | ORAL_TABLET | Freq: Every day | ORAL | 3 refills | Status: DC

## 2020-12-29 NOTE — Assessment & Plan Note (Signed)
Preventative protocols reviewed and updated unless pt declined. Discussed healthy diet and lifestyle.  

## 2020-12-29 NOTE — Assessment & Plan Note (Signed)
No personal history of blood clots.

## 2020-12-29 NOTE — Patient Instructions (Addendum)
Labs today You are doing well today Return as needed or in 1 year for next physical   Health Maintenance, Female Adopting a healthy lifestyle and getting preventive care are important in promoting health and wellness. Ask your health care provider about:  The right schedule for you to have regular tests and exams.  Things you can do on your own to prevent diseases and keep yourself healthy. What should I know about diet, weight, and exercise? Eat a healthy diet  Eat a diet that includes plenty of vegetables, fruits, low-fat dairy products, and lean protein.  Do not eat a lot of foods that are high in solid fats, added sugars, or sodium.   Maintain a healthy weight Body mass index (BMI) is used to identify weight problems. It estimates body fat based on height and weight. Your health care provider can help determine your BMI and help you achieve or maintain a healthy weight. Get regular exercise Get regular exercise. This is one of the most important things you can do for your health. Most adults should:  Exercise for at least 150 minutes each week. The exercise should increase your heart rate and make you sweat (moderate-intensity exercise).  Do strengthening exercises at least twice a week. This is in addition to the moderate-intensity exercise.  Spend less time sitting. Even light physical activity can be beneficial. Watch cholesterol and blood lipids Have your blood tested for lipids and cholesterol at 43 years of age, then have this test every 5 years. Have your cholesterol levels checked more often if:  Your lipid or cholesterol levels are high.  You are older than 43 years of age.  You are at high risk for heart disease. What should I know about cancer screening? Depending on your health history and family history, you may need to have cancer screening at various ages. This may include screening for:  Breast cancer.  Cervical cancer.  Colorectal cancer.  Skin  cancer.  Lung cancer. What should I know about heart disease, diabetes, and high blood pressure? Blood pressure and heart disease  High blood pressure causes heart disease and increases the risk of stroke. This is more likely to develop in people who have high blood pressure readings, are of African descent, or are overweight.  Have your blood pressure checked: ? Every 3-5 years if you are 43-16 years of age. ? Every year if you are 43 years old or older. Diabetes Have regular diabetes screenings. This checks your fasting blood sugar level. Have the screening done:  Once every three years after age 55 if you are at a normal weight and have a low risk for diabetes.  More often and at a younger age if you are overweight or have a high risk for diabetes. What should I know about preventing infection? Hepatitis B If you have a higher risk for hepatitis B, you should be screened for this virus. Talk with your health care provider to find out if you are at risk for hepatitis B infection. Hepatitis C Testing is recommended for:  Everyone born from [birth year] through 1963.  Anyone with known risk factors for hepatitis C. Sexually transmitted infections (STIs)  Get screened for STIs, including gonorrhea and chlamydia, if: ? You are sexually active and are younger than 43 years of age. ? You are older than 43 years of age and your health care provider tells you that you are at risk for this type of infection. ? Your sexual activity has changed since  you were last screened, and you are at increased risk for chlamydia or gonorrhea. Ask your health care provider if you are at risk.  Ask your health care provider about whether you are at high risk for HIV. Your health care provider may recommend a prescription medicine to help prevent HIV infection. If you choose to take medicine to prevent HIV, you should first get tested for HIV. You should then be tested every 3 months for as long as you are taking  the medicine. Pregnancy  If you are about to stop having your period (premenopausal) and you may become pregnant, seek counseling before you get pregnant.  Take 400 to 800 micrograms (mcg) of folic acid every day if you become pregnant.  Ask for birth control (contraception) if you want to prevent pregnancy. Osteoporosis and menopause Osteoporosis is a disease in which the bones lose minerals and strength with aging. This can result in bone fractures. If you are 43 years old or older, or if you are at risk for osteoporosis and fractures, ask your health care provider if you should:  Be screened for bone loss.  Take a calcium or vitamin D supplement to lower your risk of fractures.  Be given hormone replacement therapy (HRT) to treat symptoms of menopause. Follow these instructions at home: Lifestyle  Do not use any products that contain nicotine or tobacco, such as cigarettes, e-cigarettes, and chewing tobacco. If you need help quitting, ask your health care provider.  Do not use street drugs.  Do not share needles.  Ask your health care provider for help if you need support or information about quitting drugs. Alcohol use  Do not drink alcohol if: ? Your health care provider tells you not to drink. ? You are pregnant, may be pregnant, or are planning to become pregnant.  If you drink alcohol: ? Limit how much you use to 0-1 drink a day. ? Limit intake if you are breastfeeding.  Be aware of how much alcohol is in your drink. In the [place] one drink equals one 12 oz bottle of beer (355 mL), one 5 oz glass of wine (148 mL), or one 1 oz glass of hard liquor (44 mL). General instructions  Schedule regular health, dental, and eye exams.  Stay current with your vaccines.  Tell your health care provider if: ? You often feel depressed. ? You have ever been abused or do not feel safe at home. Summary  Adopting a healthy lifestyle and getting preventive care are important in  promoting health and wellness.  Follow your health care provider's instructions about healthy diet, exercising, and getting tested or screened for diseases.  Follow your health care provider's instructions on monitoring your cholesterol and blood pressure. This information is not intended to replace advice given to you by your health care provider. Make sure you discuss any questions you have with your health care provider. Document Revised: 08/01/2018 Document Reviewed: 08/01/2018 Elsevier Patient Education  2021 [employer].

## 2020-12-29 NOTE — Assessment & Plan Note (Signed)
Chronic, stable. Continue current regimen. 

## 2020-12-29 NOTE — Progress Notes (Signed)
Patient ID: UNIQUE SILLAS, female    DOB: 05-Feb-1976, 45 y.o.   MRN: [id number]  This visit was conducted in person.  BP 124/82   Pulse 100   Temp (!) 97.5 F (36.4 C) (Temporal)   Ht 5\' 5"  (1.651 m)   Wt 169 lb 9 oz (76.9 kg)   LMP 12/15/2020   SpO2 98%   BMI 28.22 kg/m    CC: CPE  Subjective:   HPI: [name] is a 45 y.o. female presenting on 12/29/2020 for Annual Exam   H/o protein C deficiency.  H/o asthma - requests albuterol PRN mainly during allergy season.   Preventative: COLONOSCOPY 12/2012 - mod diverticulosis 01/2013)  Well woman examwith OBGYN last saw Dr Arlyce Dice with normal pap 05/2019. Planning to return later this year.  LMP 12/16/2020  Breast cancer screening -planning to schedule - 1/2 aunt with h/o breast cancer.  DEXA scan -not due Flu shot -declines COVID vaccine Pfizer 03/2020, 04/2020 Tdap 04/2017 Pneumonia shot -not due Shingrix -not due Seat belt use discussed Sunscreen use discussed. No changing moles on skin.  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 Interim medical history since our last visit reviewed. Allergies and medications reviewed and updated. Outpatient Medications Prior to Visit  Medication Sig Dispense Refill  . cetirizine (ZYRTEC) 10 MG tablet Take 10 mg by mouth daily. As needed    . loteprednol (LOTEMAX) 0.5 % ophthalmic suspension PLACE 1 DROP INTO LEFT EYE AT BEDTIME FOR 14 DAYS    . Multiple Vitamin (MULTIVITAMIN ADULT) TABS Take 1 tablet by mouth daily.    [employer] albuterol (PROVENTIL HFA;VENTOLIN HFA) 108 (90  Base) MCG/ACT inhaler Inhale 2 puffs into the lungs every 6 (six) hours as needed for wheezing or shortness of breath. 1 Inhaler 0  . amLODipine (NORVASC) 2.5 MG tablet TAKE 1 TABLET BY MOUTH EVERY DAY 90 tablet 0  . fluconazole (DIFLUCAN) 150 MG tablet TAKE 1 TABLET BY MOUTH AS ONE DOSE. 2 tablet 0  . vitamin B-12 (V-R VITAMIN B-12) 500 MCG tablet Take 1 tablet (500 mcg total) by mouth every Monday, Wednesday, and Friday.     No facility-administered medications prior to visit.     Per HPI unless specifically indicated in ROS section below Review of Systems  Constitutional: Negative for activity change, appetite change, chills, fatigue, fever and unexpected weight change.  HENT: Negative for hearing loss.   Eyes: Negative for visual disturbance.  Respiratory: Negative for cough, chest tightness, shortness of breath and wheezing.   Cardiovascular: Negative for chest pain, palpitations and leg swelling.  Gastrointestinal: Negative for abdominal distention, abdominal pain, blood in stool, constipation, diarrhea, nausea and vomiting.  Genitourinary: Negative for difficulty urinating and hematuria.  Musculoskeletal: Negative for arthralgias, myalgias and neck pain.  Skin: Negative for rash.  Neurological: Negative for dizziness, seizures, syncope and headaches.  Hematological: Negative for adenopathy. Does not bruise/bleed easily.  Psychiatric/Behavioral: Negative for dysphoric mood. The patient is not nervous/anxious.    Objective:  BP 124/82   Pulse 100   Temp (!) 97.5 F (36.4 C) (Temporal)   Ht 5'  5" (1.651 m)   Wt 169 lb 9 oz (76.9 kg)   LMP 12/15/2020   SpO2 98%   BMI 28.22 kg/m   Wt Readings from Last 3 Encounters:  12/29/20 169 lb 9 oz (76.9 kg)  11/06/19 167 lb 3 oz (75.8 kg)  05/27/19 171 lb 3.2 oz (77.7 kg)      Physical Exam Vitals and nursing note reviewed.  Constitutional:      General: She is not in acute distress.    Appearance: Normal appearance. She is  well-developed. She is not ill-appearing.  HENT:     Head: Normocephalic and atraumatic.     Right Ear: Hearing, tympanic membrane, ear canal and external ear normal.     Left Ear: Hearing, tympanic membrane, ear canal and external ear normal.  Eyes:     General: No scleral icterus.    Extraocular Movements: Extraocular movements intact.     Conjunctiva/sclera: Conjunctivae normal.     Pupils: Pupils are equal, round, and reactive to light.  Neck:     Thyroid: No thyroid mass or thyromegaly.  Cardiovascular:     Rate and Rhythm: Normal rate and regular rhythm.     Pulses: Normal pulses.          Radial pulses are 2+ on the right side and 2+ on the left side.     Heart sounds: Normal heart sounds. No murmur heard.   Pulmonary:     Effort: Pulmonary effort is normal. No respiratory distress.     Breath sounds: Normal breath sounds. No wheezing, rhonchi or rales.  Abdominal:     General: Bowel sounds are normal. There is no distension.     Palpations: Abdomen is soft. There is no mass.     Tenderness: There is no abdominal tenderness. There is no guarding or rebound.     Hernia: No hernia is present.  Musculoskeletal:        General: Normal range of motion.     Cervical back: Normal range of motion and neck supple.     Right lower leg: No edema.     Left lower leg: No edema.  Lymphadenopathy:     Cervical: No cervical adenopathy.  Skin:    General: Skin is warm and dry.     Findings: No rash.  Neurological:     General: No focal deficit present.     Mental Status: She is alert and oriented to person, place, and time.     Comments: CN grossly intact, station and gait intact  Psychiatric:        Mood and Affect: Mood normal.        Behavior: Behavior normal.        Thought Content: Thought content normal.        Judgment: Judgment normal.       Depression screen [facility] 2/9 12/29/2020 11/06/2019 05/15/2018 05/15/2018  Decreased Interest 0 0 1 1  Down, Depressed, Hopeless 0 0 2 1   PHQ - 2 Score 0 0 3 2  Altered sleeping - - 2 -  Tired, decreased energy - - 1 -  Change in appetite - - 0 -  Feeling bad or failure about yourself  - - 3 -  Trouble concentrating - - 1 -  Moving slowly or fidgety/restless - - 0 -  Suicidal thoughts - - 1 -  PHQ-9 Score - - 11 -    GAD 7 : Generalized Anxiety Score 05/15/2018  Nervous, Anxious, on  Edge 2  Control/stop worrying 2  Worry too much - different things 2  Trouble relaxing 2  Restless 1  Easily annoyed or irritable 2  Afraid - awful might happen 2  Total GAD 7 Score 13   Assessment & Plan:  This visit occurred during the SARS-CoV-2 public health emergency.  Safety protocols were in place, including screening questions prior to the visit, additional usage of staff PPE, and extensive cleaning of exam room while observing appropriate contact time as indicated for disinfecting solutions.   Problem List Items Addressed This Visit    Protein C deficiency (HCC)    No personal history of blood clots.       Hypertension, essential    Chronic, stable. Continue current regimen.       Relevant Medications   amLODipine (NORVASC) 2.5 MG tablet   Other Relevant Orders   Comprehensive metabolic panel   TSH   Health maintenance examination - Primary    Preventative protocols reviewed and updated unless pt declined. Discussed healthy diet and lifestyle.       Overweight with body mass index (BMI) 25.0-29.9    Encouraged healthy diet and lifestyle           Meds ordered this encounter  Medications  . albuterol (VENTOLIN HFA) 108 (90 Base) MCG/ACT inhaler    Sig: Inhale 2 puffs into the lungs every 6 (six) hours as needed for wheezing or shortness of breath.    Dispense:  1 each    Refill:  3  . amLODipine (NORVASC) 2.5 MG tablet    Sig: Take 1 tablet (2.5 mg total) by mouth daily.    Dispense:  90 tablet    Refill:  3   Orders Placed This Encounter  Procedures  . Comprehensive metabolic panel  . TSH     Patient instructions: Labs today You are doing well today Return as needed or in 1 year for next physical   Follow up plan: Return in about 1 year (around 12/29/2021), or if symptoms worsen or fail to improve, for annual exam, prior fasting for blood work.  Eustaquio Boyden, MD

## 2020-12-29 NOTE — Assessment & Plan Note (Signed)
Encouraged healthy diet and lifestyle.  

## 2021-03-04 ENCOUNTER — Ambulatory Visit (INDEPENDENT_AMBULATORY_CARE_PROVIDER_SITE_OTHER): Payer: Medicaid Other | Admitting: Obstetrics & Gynecology

## 2021-03-04 ENCOUNTER — Encounter: Payer: Self-pay | Admitting: Obstetrics & Gynecology

## 2021-03-04 ENCOUNTER — Other Ambulatory Visit: Payer: Self-pay

## 2021-03-04 ENCOUNTER — Other Ambulatory Visit (HOSPITAL_COMMUNITY)
Admission: RE | Admit: 2021-03-04 | Discharge: 2021-03-04 | Disposition: A | Discharge status: Home / Self Care | Payer: 59 | Source: Ambulatory Visit | Attending: Obstetrics & Gynecology | Admitting: Obstetrics & Gynecology

## 2021-03-04 VITALS — BP 135/84 | HR 90 | Ht 65.0 in | Wt 175.0 lb

## 2021-03-04 DIAGNOSIS — Z113 Encounter for screening for infections with a predominantly sexual mode of transmission: Secondary | ICD-10-CM

## 2021-03-04 DIAGNOSIS — Z1231 Encounter for screening mammogram for malignant neoplasm of breast: Secondary | ICD-10-CM | POA: Diagnosis not present

## 2021-03-04 DIAGNOSIS — N898 Other specified noninflammatory disorders of vagina: Secondary | ICD-10-CM | POA: Diagnosis not present

## 2021-03-04 DIAGNOSIS — Z01419 Encounter for gynecological examination (general) (routine) without abnormal findings: Secondary | ICD-10-CM | POA: Insufficient documentation

## 2021-03-04 DIAGNOSIS — Z30011 Encounter for initial prescription of contraceptive pills: Secondary | ICD-10-CM

## 2021-03-04 MED ORDER — SLYND 4 MG PO TABS: 1.0000 | ORAL_TABLET | Freq: Every day | ORAL | 0 refills | Status: DC

## 2021-03-04 NOTE — Progress Notes (Signed)
GYNECOLOGY ANNUAL PREVENTATIVE CARE ENCOUNTER NOTE  History:     [name] is a 45 y.o. [phone] female here for a routine annual gynecologic exam.  Current complaints: white vaginal discharge for a few days, occasionally irritating.  Desirs evaluation of this and also desires annual STI screen.  Denies abnormal vaginal bleeding, pelvic pain, problems with intercourse or other gynecologic concerns.    Gynecologic History Patient's last menstrual period was 02/14/2021. Contraception: none Last Pap: 05/27/2019. Results were: normal with negative HPV  Obstetric History OB History  Gravida Para Term Preterm AB Living  4 2 1 1 2 1   SAB IAB Ectopic Multiple Live Births  1 1 0 0 2    # Outcome Date GA Lbr Len/2nd Weight Sex Delivery Anes PTL Lv  4 SAB 08/18/16 [redacted]w[redacted]d    SAB     3 IAB 03/06/15 [redacted]w[redacted]d    TAB     2 Term 11/05/04 [redacted]w[redacted]d  6 lb 12 oz (3.062 kg) F CS-Unspec   LIV  1 Preterm 06/08/00 [redacted]w[redacted]d  14.9 oz (0.423 kg) F CS-Classical Spinal  DEC    Past Medical History:  Diagnosis Date   Diverticulosis of colon    History of abnormal cervical Pap smear    age 17   History of asthma    childhood   History of diverticulitis of colon 11/2012   Hypertension    Miscarriage 01/2018   Missed ab    Protein C deficiency [facility])     Past Surgical History:  Procedure Laterality Date   CESAREAN SECTION  06/08/00   CESAREAN SECTION  11/05/04   COLONOSCOPY  12/2012   mod diverticulosis 01/2013)   COLPOSCOPY  2013   GYNECOLOGIC CRYOSURGERY  1999    Current Outpatient Medications on File Prior to Visit  Medication Sig Dispense Refill   albuterol (VENTOLIN HFA) 108 (90 Base) MCG/ACT inhaler Inhale 2 puffs into the lungs every 6 (six) hours as needed for wheezing or shortness of breath. 1 each 3   amLODipine (NORVASC) 2.5 MG tablet Take 1 tablet (2.5 mg total) by mouth daily. 90 tablet 3   cetirizine (ZYRTEC) 10 MG tablet Take 10 mg by mouth daily. As needed     Multiple Vitamin  (MULTIVITAMIN ADULT) TABS Take 1 tablet by mouth daily.     No current facility-administered medications on file prior to visit.    Allergies  Allergen Reactions   Other Hives    Eggplant, pecans, corn, walnuts, tobacco    Social History:  reports that she has never smoked. She has never used smokeless tobacco. She reports current alcohol use. She reports that she does not use drugs.  Family History  Problem Relation Age of Onset   Hypertension Mother    Hypertension Father    Diabetes Father    Cancer Maternal Aunt 45       breast/colon   Colon cancer Maternal Aunt    Stroke Paternal Grandmother    Prostate cancer Paternal Grandfather    Pulmonary embolism Cousin 28       pulm embolism    The following portions of the patient's history were reviewed and updated as appropriate: allergies, current medications, past family history, past medical history, past social history, past surgical history and problem list.  Review of Systems Pertinent items noted in HPI and remainder of comprehensive ROS otherwise negative.  Physical Exam:  BP 135/84   Pulse 90   Ht 5\' 5"  (1.651 m)  Wt 175 lb (79.4 kg)   LMP 02/14/2021   BMI 29.12 kg/m  CONSTITUTIONAL: Well-developed, well-nourished female in no acute distress.  HENT:  Normocephalic, atraumatic, External right and left ear normal.  EYES: Conjunctivae and EOM are normal. Pupils are equal, round, and reactive to light. No scleral icterus.  NECK: Normal range of motion, supple, no masses.  Normal thyroid.  SKIN: Skin is warm and dry. No rash noted. Not diaphoretic. No erythema. No pallor. MUSCULOSKELETAL: Normal range of motion. No tenderness.  No cyanosis, clubbing, or edema. NEUROLOGIC: Alert and oriented to person, place, and time. Normal reflexes, muscle tone coordination.  PSYCHIATRIC: Normal mood and affect. Normal behavior. Normal judgment and thought content. CARDIOVASCULAR: Normal heart rate noted, regular  rhythm RESPIRATORY: Clear to auscultation bilaterally. Effort and breath sounds normal, no problems with respiration noted. BREASTS: Symmetric in size. No masses, tenderness, skin changes, nipple drainage, or lymphadenopathy bilaterally. Performed in the presence of a chaperone. ABDOMEN: Soft, no distention noted.  No tenderness, rebound or guarding.  PELVIC: Normal appearing external genitalia and urethral meatus; normal appearing vaginal mucosa and cervix.  Scant white discharge noted, testing sample obtained.  Pap smear obtained, there was some bleeding while collecting endocervical sample.  Normal uterine size, no other palpable masses, no uterine or adnexal tenderness.  Performed in the presence of a chaperone.   Assessment and Plan:    1. White vaginal discharge - Cervicovaginal ancillary only done, will follow up results and manage accordingly.  2. Breast cancer screening by mammogram Mammogram scheduled - MM 3D SCREEN BREAST BILATERAL; Future  3. Routine screening for STI (sexually transmitted infection) Annual STI screen done today, will follow up results and manage accordingly. - Cervicovaginal ancillary only - Hepatitis B surface antigen - Hepatitis C antibody - HIV Antibody (routine testing w rflx) - RPR  4. Oral contraception initiation Counseled about contraception focused on non-estrogen formulations given Protein C deficiency. She wants to try Slynd, two samples given to her. Wll reevaluate in 2 months.  - Drospirenone (SLYND) 4 MG TABS; Take 1 tablet by mouth daily.  Dispense: 56 tablet; Refill: 0  5. Well woman exam with routine gynecological exam - Cytology - PAP Will follow up results of pap smear and manage accordingly. Routine preventative health maintenance measures emphasized. Please refer to After Visit Summary for other counseling recommendations.      Jaynie Collins, MD, FACOG Obstetrician & Gynecologist, [facility] for [employer],  [facility]

## 2021-03-04 NOTE — Patient Instructions (Signed)
Preventive Care 68-43 Years Old, Female Preventive care refers to lifestyle choices and visits with your health care provider that can promote health and wellness. This includes: A yearly physical exam. This is also called an annual wellness visit. Regular dental and eye exams. Immunizations. Screening for certain conditions. Healthy lifestyle choices, such as: Eating a healthy diet. Getting regular exercise. Not using drugs or products that contain nicotine and tobacco. Limiting alcohol use. What can I expect for my preventive care visit? Physical exam Your health care provider will check your: Height and weight. These may be used to calculate your BMI (body mass index). BMI is a measurement that tells if you are at a healthy weight. Heart rate and blood pressure. Body temperature. Skin for abnormal spots. Counseling Your health care provider may ask you questions about your: Past medical problems. Family's medical history. Alcohol, tobacco, and drug use. Emotional well-being. Home life and relationship well-being. Sexual activity. Diet, exercise, and sleep habits. Work and work Statistician. Access to firearms. Method of birth control. Menstrual cycle. Pregnancy history. What immunizations do I need?  Vaccines are usually given at various ages, according to a schedule. Your health care provider will recommend vaccines for you based on your age, medicalhistory, and lifestyle or other factors, such as travel or where you work. What tests do I need? Blood tests Lipid and cholesterol levels. These may be checked every 5 years, or more often if you are over 37 years old. Hepatitis C test. Hepatitis B test. Screening Lung cancer screening. You may have this screening every year starting at age 30 if you have a 30-pack-year history of smoking and currently smoke or have quit within the past 15 years. Colorectal cancer screening. All adults should have this screening starting at  age 23 and continuing until age 3. Your health care provider may recommend screening at age 88 if you are at increased risk. You will have tests every 1-10 years, depending on your results and the type of screening test. Diabetes screening. This is done by checking your blood sugar (glucose) after you have not eaten for a while (fasting). You may have this done every 1-3 years. Mammogram. This may be done every 1-2 years. Talk with your health care provider about when you should start having regular mammograms. This may depend on whether you have a family history of breast cancer. BRCA-related cancer screening. This may be done if you have a family history of breast, ovarian, tubal, or peritoneal cancers. Pelvic exam and Pap test. This may be done every 3 years starting at age 79. Starting at age 54, this may be done every 5 years if you have a Pap test in combination with an HPV test. Other tests STD (sexually transmitted disease) testing, if you are at risk. Bone density scan. This is done to screen for osteoporosis. You may have this scan if you are at high risk for osteoporosis. Talk with your health care provider about your test results, treatment options,and if necessary, the need for more tests. Follow these instructions at home: Eating and drinking  Eat a diet that includes fresh fruits and vegetables, whole grains, lean protein, and low-fat dairy products. Take vitamin and mineral supplements as recommended by your health care provider. Do not drink alcohol if: Your health care provider tells you not to drink. You are pregnant, may be pregnant, or are planning to become pregnant. If you drink alcohol: Limit how much you have to 0-1 drink a day. Be aware  of how much alcohol is in your drink. In the [place] one drink equals one 12 oz bottle of beer (355 mL), one 5 oz glass of wine (148 mL), or one 1 oz glass of hard liquor (44 mL).  Lifestyle Take daily care of your teeth and  gums. Brush your teeth every morning and night with fluoride toothpaste. Floss one time each day. Stay active. Exercise for at least 30 minutes 5 or more days each week. Do not use any products that contain nicotine or tobacco, such as cigarettes, e-cigarettes, and chewing tobacco. If you need help quitting, ask your health care provider. Do not use drugs. If you are sexually active, practice safe sex. Use a condom or other form of protection to prevent STIs (sexually transmitted infections). If you do not wish to become pregnant, use a form of birth control. If you plan to become pregnant, see your health care provider for a prepregnancy visit. If told by your health care provider, take low-dose aspirin daily starting at age 29. Find healthy ways to cope with stress, such as: Meditation, yoga, or listening to music. Journaling. Talking to a trusted person. Spending time with friends and family. Safety Always wear your seat belt while driving or riding in a vehicle. Do not drive: If you have been drinking alcohol. Do not ride with someone who has been drinking. When you are tired or distracted. While texting. Wear a helmet and other protective equipment during sports activities. If you have firearms in your house, make sure you follow all gun safety procedures. What's next? Visit your health care provider once a year for an annual wellness visit. Ask your health care provider how often you should have your eyes and teeth checked. Stay up to date on all vaccines. This information is not intended to replace advice given to you by your health care provider. Make sure you discuss any questions you have with your healthcare provider. Document Revised: 05/12/2020 Document Reviewed: 04/19/2018 Elsevier Patient Education  2022 [employer].

## 2021-03-05 LAB — HEPATITIS C ANTIBODY: Hep C Virus Ab: <0.1 s/co ratio (ref 0.0–0.9)

## 2021-03-05 LAB — CERVICOVAGINAL ANCILLARY ONLY
Bacterial Vaginitis (gardnerella): NEGATIVE
Candida Glabrata: NEGATIVE
Candida Vaginitis: NEGATIVE
Chlamydia: NEGATIVE
Comment: NEGATIVE
Comment: NEGATIVE
Comment: NEGATIVE
Comment: NEGATIVE
Comment: NEGATIVE
Comment: NORMAL
Neisseria Gonorrhea: NEGATIVE
Trichomonas: NEGATIVE

## 2021-03-05 LAB — HEPATITIS B SURFACE ANTIGEN: Hepatitis B Surface Ag: NEGATIVE

## 2021-03-05 LAB — HIV ANTIBODY (ROUTINE TESTING W REFLEX): HIV Screen 4th Generation wRfx: NONREACTIVE

## 2021-03-05 LAB — RPR: RPR Ser Ql: NONREACTIVE

## 2021-03-09 LAB — CYTOLOGY - PAP
Comment: NEGATIVE
Diagnosis: NEGATIVE
High risk HPV: NEGATIVE

## 2021-03-10 ENCOUNTER — Ambulatory Visit
Admission: RE | Admit: 2021-03-10 | Discharge: 2021-03-10 | Disposition: A | Discharge status: Home / Self Care | Payer: 59 | Source: Ambulatory Visit | Attending: Obstetrics & Gynecology | Admitting: Obstetrics & Gynecology

## 2021-03-10 ENCOUNTER — Other Ambulatory Visit: Payer: Self-pay

## 2021-03-10 DIAGNOSIS — Z1231 Encounter for screening mammogram for malignant neoplasm of breast: Secondary | ICD-10-CM | POA: Insufficient documentation

## 2021-05-04 ENCOUNTER — Ambulatory Visit: Payer: 59 | Admitting: Obstetrics & Gynecology

## 2021-06-11 ENCOUNTER — Encounter: Payer: Self-pay | Admitting: Family Medicine

## 2021-06-11 NOTE — Telephone Encounter (Signed)
Lvm asking pt to call back.  Pt needs OV.    Also, responded to MyChart message asking pt to call office to schedule a visit.

## 2021-09-13 ENCOUNTER — Ambulatory Visit: Payer: Medicaid Other | Admitting: Family Medicine

## 2022-01-13 ENCOUNTER — Telehealth: Payer: Self-pay | Admitting: Family Medicine

## 2022-01-13 MED ORDER — AMLODIPINE BESYLATE 2.5 MG PO TABS: 2.5000 mg | ORAL_TABLET | Freq: Every day | ORAL | 0 refills | Status: DC

## 2022-01-13 NOTE — Telephone Encounter (Signed)
Pt called to schedule CPE because she is due in order to get BP meds filled, I scheduled at next available which is out in August, would it be okay to fill until then or will you sse her sooner for a CPE or do you just want her to schedule a med refill visit and keep CPE out in August. Please advise

## 2022-01-13 NOTE — Telephone Encounter (Signed)
E-scribed refill through 04/13/22 CPE.

## 2022-04-01 ENCOUNTER — Other Ambulatory Visit: Payer: Self-pay | Admitting: Family Medicine

## 2022-04-04 ENCOUNTER — Other Ambulatory Visit: Payer: Self-pay | Admitting: Family Medicine

## 2022-04-04 DIAGNOSIS — I10 Essential (primary) hypertension: Secondary | ICD-10-CM

## 2022-04-04 NOTE — Addendum Note (Signed)
Addended by: Eustaquio Boyden on: 04/04/2022 07:34 AM   Modules accepted: Orders

## 2022-04-08 ENCOUNTER — Other Ambulatory Visit: Payer: Medicaid Other

## 2022-04-12 ENCOUNTER — Other Ambulatory Visit (INDEPENDENT_AMBULATORY_CARE_PROVIDER_SITE_OTHER): Payer: Medicaid Other

## 2022-04-12 DIAGNOSIS — I10 Essential (primary) hypertension: Secondary | ICD-10-CM | POA: Diagnosis not present

## 2022-04-12 LAB — CBC WITH DIFFERENTIAL/PLATELET
Basophils Absolute: 0.1 10*3/uL (ref 0.0–0.1)
Basophils Relative: 1.4 % (ref 0.0–3.0)
Eosinophils Absolute: 0.4 10*3/uL (ref 0.0–0.7)
Eosinophils Relative: 7.9 % — ABNORMAL HIGH (ref 0.0–5.0)
HCT: 33.6 % — ABNORMAL LOW (ref 36.0–46.0)
Hemoglobin: 10.9 g/dL — ABNORMAL LOW (ref 12.0–15.0)
Lymphocytes Relative: 30.4 % (ref 12.0–46.0)
Lymphs Abs: 1.4 10*3/uL (ref 0.7–4.0)
MCHC: 32.4 g/dL (ref 30.0–36.0)
MCV: 80.9 fl (ref 78.0–100.0)
Monocytes Absolute: 0.5 10*3/uL (ref 0.1–1.0)
Monocytes Relative: 10.3 % (ref 3.0–12.0)
Neutro Abs: 2.3 10*3/uL (ref 1.4–7.7)
Neutrophils Relative %: 50 % (ref 43.0–77.0)
Platelets: 308 10*3/uL (ref 150.0–400.0)
RBC: 4.15 Mil/uL (ref 3.87–5.11)
RDW: 15.8 % — ABNORMAL HIGH (ref 11.5–15.5)
WBC: 4.6 10*3/uL (ref 4.0–10.5)

## 2022-04-12 LAB — COMPREHENSIVE METABOLIC PANEL
ALT: 12 U/L (ref 0–35)
AST: 17 U/L (ref 0–37)
Albumin: 3.9 g/dL (ref 3.5–5.2)
Alkaline Phosphatase: 47 U/L (ref 39–117)
BUN: 12 mg/dL (ref 6–23)
CO2: 24 mEq/L (ref 19–32)
Calcium: 8.5 mg/dL (ref 8.4–10.5)
Chloride: 105 mEq/L (ref 96–112)
Creatinine, Ser: 0.84 mg/dL (ref 0.40–1.20)
GFR: 84.83 mL/min (ref >60.00)
Glucose, Bld: 88 mg/dL (ref 70–99)
Potassium: 3.7 mEq/L (ref 3.5–5.1)
Sodium: 136 mEq/L (ref 135–145)
Total Bilirubin: 0.9 mg/dL (ref 0.2–1.2)
Total Protein: 7.4 g/dL (ref 6.0–8.3)

## 2022-04-12 LAB — MICROALBUMIN / CREATININE URINE RATIO
Creatinine,U: 139.3 mg/dL
Microalb Creat Ratio: 0.5 mg/g (ref 0.0–30.0)
Microalb, Ur: <0.7 mg/dL (ref 0.0–1.9)

## 2022-04-12 LAB — LIPID PANEL
Cholesterol: 181 mg/dL (ref 0–200)
HDL: 65.3 mg/dL (ref >39.00)
LDL Cholesterol: 104 mg/dL — ABNORMAL HIGH (ref 0–99)
NonHDL: 116.19
Total CHOL/HDL Ratio: 3
Triglycerides: 59 mg/dL (ref 0.0–149.0)
VLDL: 11.8 mg/dL (ref 0.0–40.0)

## 2022-04-13 ENCOUNTER — Ambulatory Visit (INDEPENDENT_AMBULATORY_CARE_PROVIDER_SITE_OTHER): Payer: Medicaid Other | Admitting: Family Medicine

## 2022-04-13 ENCOUNTER — Encounter: Payer: Self-pay | Admitting: Family Medicine

## 2022-04-13 VITALS — BP 130/82 | HR 95 | Temp 97.7°F | Ht 64.5 in | Wt 168.5 lb

## 2022-04-13 DIAGNOSIS — G4700 Insomnia, unspecified: Secondary | ICD-10-CM

## 2022-04-13 DIAGNOSIS — I10 Essential (primary) hypertension: Secondary | ICD-10-CM | POA: Diagnosis not present

## 2022-04-13 DIAGNOSIS — D649 Anemia, unspecified: Secondary | ICD-10-CM | POA: Diagnosis not present

## 2022-04-13 DIAGNOSIS — Z0000 Encounter for general adult medical examination without abnormal findings: Secondary | ICD-10-CM

## 2022-04-13 MED ORDER — AMLODIPINE BESYLATE 2.5 MG PO TABS: 2.5000 mg | ORAL_TABLET | Freq: Every day | ORAL | 3 refills | Status: DC

## 2022-04-13 MED ORDER — ALBUTEROL SULFATE HFA 108 (90 BASE) MCG/ACT IN AERS: 2.0000 | INHALATION_SPRAY | Freq: Four times a day (QID) | RESPIRATORY_TRACT | 1 refills | Status: AC | PRN

## 2022-04-13 NOTE — Assessment & Plan Note (Signed)
Newly noted, regular cycles but not heavy. Anticipate due to menstrual related blood loss. She is symptomatic with intermittent lightheadedness and palpitations. I have asked her to return for iFOB and UA when not on cycle to ensure no other source of blood loss.  Discussed dietary iron intake as well as starting oral iron 2-3 times a week. Recommend recheck CBC in 3 months to ensure improving.

## 2022-04-13 NOTE — Assessment & Plan Note (Signed)
Chronic, stable on current regimen of low dose amlodipine- continue

## 2022-04-13 NOTE — Patient Instructions (Addendum)
New anemia - work on iron in the diet, try iron pill 2-3 times a week.  Pass by lab to pick up stool kit. Return at your convenience for urinalysis.  Good to see you today  Return as needed or in 1 year for next physical.  Return in 3 months for lab visit only to recheck anemia.  Health Maintenance, Female Adopting a healthy lifestyle and getting preventive care are important in promoting health and wellness. Ask your health care provider about: The right schedule for you to have regular tests and exams. Things you can do on your own to prevent diseases and keep yourself healthy. What should I know about diet, weight, and exercise? Eat a healthy diet  Eat a diet that includes plenty of vegetables, fruits, low-fat dairy products, and lean protein. Do not eat a lot of foods that are high in solid fats, added sugars, or sodium. Maintain a healthy weight Body mass index (BMI) is used to identify weight problems. It estimates body fat based on height and weight. Your health care provider can help determine your BMI and help you achieve or maintain a healthy weight. Get regular exercise Get regular exercise. This is one of the most important things you can do for your health. Most adults should: Exercise for at least 150 minutes each week. The exercise should increase your heart rate and make you sweat (moderate-intensity exercise). Do strengthening exercises at least twice a week. This is in addition to the moderate-intensity exercise. Spend less time sitting. Even light physical activity can be beneficial. Watch cholesterol and blood lipids Have your blood tested for lipids and cholesterol at 44 years of age, then have this test every 5 years. Have your cholesterol levels checked more often if: Your lipid or cholesterol levels are high. You are older than 44 years of age. You are at high risk for heart disease. What should I know about cancer screening? Depending on your health history and  family history, you may need to have cancer screening at various ages. This may include screening for: Breast cancer. Cervical cancer. Colorectal cancer. Skin cancer. Lung cancer. What should I know about heart disease, diabetes, and high blood pressure? Blood pressure and heart disease High blood pressure causes heart disease and increases the risk of stroke. This is more likely to develop in people who have high blood pressure readings or are overweight. Have your blood pressure checked: Every 3-5 years if you are 43-61 years of age. Every year if you are 59 years old or older. Diabetes Have regular diabetes screenings. This checks your fasting blood sugar level. Have the screening done: Once every three years after age 81 if you are at a normal weight and have a low risk for diabetes. More often and at a younger age if you are overweight or have a high risk for diabetes. What should I know about preventing infection? Hepatitis B If you have a higher risk for hepatitis B, you should be screened for this virus. Talk with your health care provider to find out if you are at risk for hepatitis B infection. Hepatitis C Testing is recommended for: Everyone born from [birth year] through 1963. Anyone with known risk factors for hepatitis C. Sexually transmitted infections (STIs) Get screened for STIs, including gonorrhea and chlamydia, if: You are sexually active and are younger than 44 years of age. You are older than 44 years of age and your health care provider tells you that you are at risk for  this type of infection. Your sexual activity has changed since you were last screened, and you are at increased risk for chlamydia or gonorrhea. Ask your health care provider if you are at risk. Ask your health care provider about whether you are at high risk for HIV. Your health care provider may recommend a prescription medicine to help prevent HIV infection. If you choose to take medicine to prevent HIV,  you should first get tested for HIV. You should then be tested every 3 months for as long as you are taking the medicine. Pregnancy If you are about to stop having your period (premenopausal) and you may become pregnant, seek counseling before you get pregnant. Take 400 to 800 micrograms (mcg) of folic acid every day if you become pregnant. Ask for birth control (contraception) if you want to prevent pregnancy. Osteoporosis and menopause Osteoporosis is a disease in which the bones lose minerals and strength with aging. This can result in bone fractures. If you are 14 years old or older, or if you are at risk for osteoporosis and fractures, ask your health care provider if you should: Be screened for bone loss. Take a calcium or vitamin D supplement to lower your risk of fractures. Be given hormone replacement therapy (HRT) to treat symptoms of menopause. Follow these instructions at home: Alcohol use Do not drink alcohol if: Your health care provider tells you not to drink. You are pregnant, may be pregnant, or are planning to become pregnant. If you drink alcohol: Limit how much you have to: 0-1 drink a day. Know how much alcohol is in your drink. In the [place] one drink equals one 12 oz bottle of beer (355 mL), one 5 oz glass of wine (148 mL), or one 1 oz glass of hard liquor (44 mL). Lifestyle Do not use any products that contain nicotine or tobacco. These products include cigarettes, chewing tobacco, and vaping devices, such as e-cigarettes. If you need help quitting, ask your health care provider. Do not use street drugs. Do not share needles. Ask your health care provider for help if you need support or information about quitting drugs. General instructions Schedule regular health, dental, and eye exams. Stay current with your vaccines. Tell your health care provider if: You often feel depressed. You have ever been abused or do not feel safe at home. Summary Adopting a healthy  lifestyle and getting preventive care are important in promoting health and wellness. Follow your health care provider's instructions about healthy diet, exercising, and getting tested or screened for diseases. Follow your health care provider's instructions on monitoring your cholesterol and blood pressure. This information is not intended to replace advice given to you by your health care provider. Make sure you discuss any questions you have with your health care provider. Document Revised: 12/28/2020 Document Reviewed: 12/28/2020 Elsevier Patient Education  [place].

## 2022-04-13 NOTE — Progress Notes (Signed)
Patient ID: [name], female    DOB: December 09, 1975, 46 y.o.   MRN: [id number]  This visit was conducted in person.  BP 130/82   Pulse 95   Temp 97.7 F (36.5 C) (Temporal)   Ht 5' 4.5" (1.638 m)   Wt 168 lb 8 oz (76.4 kg)   LMP 04/12/2022   SpO2 97%   BMI 28.48 kg/m    CC: CPE Subjective:   HPI: [name] is a 46 y.o. female presenting on 04/13/2022 for Annual Exam   H/o protein C deficiency.  H/o reactive airways during allergy season, rarely uses PRN albuterol.   Struggling with insomnia - wonders if BP med related. Has tried melatonin $RemoveBeforeD'5mg'evqUMUdFLuhiiV$ , zzquil, vitamin shop supplement without much benefit.   Avoids NSAIDs.   Preventative: COLONOSCOPY 12/2012 - mod diverticulosis Deatra Ina)  Well woman exam with OBGYN Dr Harolyn Rutherford with normal pap last seen 02/2021. sees Q2 yrs.  LMP 04/12/2022  Mammogram 02/2021 Birads1 @ [place] DEXA scan - not due  Flu shot - declines COVID vaccine Pfizer 03/2020, 04/2020, booster x1 Tdap 04/2017 Pneumonia shot - not due Shingrix - not due Seat belt use discussed  Sunscreen use discussed. No changing moles on skin.  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 Interim medical history since our last visit reviewed. Allergies and medications reviewed and updated. Outpatient Medications Prior to Visit  Medication Sig Dispense Refill   cetirizine (ZYRTEC) 10 MG tablet Take 10 mg by mouth daily. As needed     Drospirenone (SLYND) 4 MG TABS Take 1 tablet by mouth daily. 56 tablet 0   Multiple Vitamin (MULTIVITAMIN ADULT) TABS Take 1 tablet by  mouth daily.     albuterol (VENTOLIN HFA) 108 (90 Base) MCG/ACT inhaler Inhale 2 puffs into the lungs every 6 (six) hours as needed for wheezing or shortness of breath. 1 each 3   amLODipine (NORVASC) 2.5 MG tablet TAKE 1 TABLET BY MOUTH EVERY DAY 90 tablet 0   No facility-administered medications prior to visit.     Per HPI unless specifically indicated in ROS section below Review of Systems  Constitutional:  Negative for activity change, appetite change, chills, fatigue, fever and unexpected weight change.  HENT:  Negative for hearing loss.   Eyes:  Negative for visual disturbance.  Respiratory:  Negative for cough, chest tightness, shortness of breath and wheezing.   Cardiovascular:  Positive for palpitations. Negative for chest pain and leg swelling.  Gastrointestinal:  Negative for abdominal distention, abdominal pain, blood in stool, constipation, diarrhea, nausea and vomiting.  Genitourinary:  Negative for difficulty urinating and hematuria.  Musculoskeletal:  Negative for arthralgias, myalgias and neck pain.  Skin:  Negative for rash.  Neurological:  Positive for dizziness and light-headedness. Negative for seizures, syncope and headaches.  Hematological:  Negative for adenopathy. Does not bruise/bleed easily.  Psychiatric/Behavioral:  Negative for dysphoric mood. The patient is not nervous/anxious.     Objective:  BP 130/82   Pulse 95   Temp 97.7 F (36.5 C) (Temporal)   Ht 5' 4.5" (1.638 m)  Wt 168 lb 8 oz (76.4 kg)   LMP 04/12/2022   SpO2 97%   BMI 28.48 kg/m   Wt Readings from Last 3 Encounters:  04/13/22 168 lb 8 oz (76.4 kg)  03/04/21 175 lb (79.4 kg)  12/29/20 169 lb 9 oz (76.9 kg)      Physical Exam Vitals and nursing note reviewed.  Constitutional:      Appearance: Normal appearance. She is not ill-appearing.  HENT:     Head: Normocephalic and atraumatic.     Right Ear: Tympanic membrane, ear canal and external ear normal. There is no impacted cerumen.      Left Ear: Tympanic membrane, ear canal and external ear normal. There is no impacted cerumen.  Eyes:     General:        Right eye: No discharge.        Left eye: No discharge.     Extraocular Movements: Extraocular movements intact.     Conjunctiva/sclera: Conjunctivae normal.     Pupils: Pupils are equal, round, and reactive to light.  Neck:     Thyroid: No thyroid mass or thyromegaly.  Cardiovascular:     Rate and Rhythm: Normal rate and regular rhythm.     Pulses: Normal pulses.     Heart sounds: Normal heart sounds. No murmur heard. Pulmonary:     Effort: Pulmonary effort is normal. No respiratory distress.     Breath sounds: Normal breath sounds. No wheezing, rhonchi or rales.  Abdominal:     General: Bowel sounds are normal. There is no distension.     Palpations: Abdomen is soft. There is no mass.     Tenderness: There is no abdominal tenderness. There is no guarding or rebound.     Hernia: No hernia is present.  Musculoskeletal:     Cervical back: Normal range of motion and neck supple. No rigidity.     Right lower leg: No edema.     Left lower leg: No edema.  Lymphadenopathy:     Cervical: No cervical adenopathy.  Skin:    General: Skin is warm and dry.     Findings: No rash.  Neurological:     General: No focal deficit present.     Mental Status: She is alert. Mental status is at baseline.  Psychiatric:        Mood and Affect: Mood normal.        Behavior: Behavior normal.       Results for orders placed or performed in visit on 04/12/22  Microalbumin / creatinine urine ratio  Result Value Ref Range   Microalb, Ur <0.7 0.0 - 1.9 mg/dL   Creatinine,U 139.3 mg/dL   Microalb Creat Ratio 0.5 0.0 - 30.0 mg/g  CBC with Differential/Platelet  Result Value Ref Range   WBC 4.6 4.0 - 10.5 K/uL   RBC 4.15 3.87 - 5.11 Mil/uL   Hemoglobin 10.9 (L) 12.0 - 15.0 g/dL   HCT 33.6 (L) 36.0 - 46.0 %   MCV 80.9 78.0 - 100.0 fl   MCHC 32.4 30.0 - 36.0 g/dL   RDW 15.8  (H) 11.5 - 15.5 %   Platelets 308.0 150.0 - 400.0 K/uL   Neutrophils Relative % 50.0 43.0 - 77.0 %   Lymphocytes Relative 30.4 12.0 - 46.0 %   Monocytes Relative 10.3 3.0 - 12.0 %   Eosinophils Relative 7.9 (H) 0.0 - 5.0 %   Basophils Relative 1.4 0.0 - 3.0 %   Neutro Abs 2.3 1.4 -  7.7 K/uL   Lymphs Abs 1.4 0.7 - 4.0 K/uL   Monocytes Absolute 0.5 0.1 - 1.0 K/uL   Eosinophils Absolute 0.4 0.0 - 0.7 K/uL   Basophils Absolute 0.1 0.0 - 0.1 K/uL  Lipid panel  Result Value Ref Range   Cholesterol 181 0 - 200 mg/dL   Triglycerides 59.0 0.0 - 149.0 mg/dL   HDL 65.30 >39.00 mg/dL   VLDL 11.8 0.0 - 40.0 mg/dL   LDL Cholesterol 104 (H) 0 - 99 mg/dL   Total CHOL/HDL Ratio 3    NonHDL 116.19   Comprehensive metabolic panel  Result Value Ref Range   Sodium 136 135 - 145 mEq/L   Potassium 3.7 3.5 - 5.1 mEq/L   Chloride 105 96 - 112 mEq/L   CO2 24 19 - 32 mEq/L   Glucose, Bld 88 70 - 99 mg/dL   BUN 12 6 - 23 mg/dL   Creatinine, Ser 0.84 0.40 - 1.20 mg/dL   Total Bilirubin 0.9 0.2 - 1.2 mg/dL   Alkaline Phosphatase 47 39 - 117 U/L   AST 17 0 - 37 U/L   ALT 12 0 - 35 U/L   Total Protein 7.4 6.0 - 8.3 g/dL   Albumin 3.9 3.5 - 5.2 g/dL   GFR 84.83 >60.00 mL/min   Calcium 8.5 8.4 - 10.5 mg/dL    Assessment & Plan:   Problem List Items Addressed This Visit     Health maintenance examination - Primary (Chronic)    Preventative protocols reviewed and updated unless pt declined. Discussed healthy diet and lifestyle.       Hypertension, essential    Chronic, stable on current regimen of low dose amlodipine- continue      Relevant Medications   amLODipine (NORVASC) 2.5 MG tablet   Anemia    Newly noted, regular cycles but not heavy. Anticipate due to menstrual related blood loss. She is symptomatic with intermittent lightheadedness and palpitations. I have asked her to return for iFOB and UA when not on cycle to ensure no other source of blood loss.  Discussed dietary iron intake as  well as starting oral iron 2-3 times a week. Recommend recheck CBC in 3 months to ensure improving.       Relevant Orders   Fecal occult blood, imunochemical   Urinalysis, Routine w reflex microscopic   CBC with Differential/Platelet   IBC panel   Ferritin   Insomnia    Intermittent. Will monitor for now.         Meds ordered this encounter  Medications   amLODipine (NORVASC) 2.5 MG tablet    Sig: Take 1 tablet (2.5 mg total) by mouth daily.    Dispense:  90 tablet    Refill:  3   albuterol (VENTOLIN HFA) 108 (90 Base) MCG/ACT inhaler    Sig: Inhale 2 puffs into the lungs every 6 (six) hours as needed for wheezing or shortness of breath.    Dispense:  1 each    Refill:  1   Orders Placed This Encounter  Procedures   Fecal occult blood, imunochemical    Standing Status:   Future    Standing Expiration Date:   04/14/2023   Urinalysis, Routine w reflex microscopic    Standing Status:   Future    Standing Expiration Date:   04/14/2023   CBC with Differential/Platelet    Standing Status:   Future    Standing Expiration Date:   04/14/2023   IBC panel  Standing Status:   Future    Standing Expiration Date:   04/14/2023   Ferritin    Standing Status:   Future    Standing Expiration Date:   04/14/2023     Patient instructions: New anemia - work on iron in the diet, try iron pill 2-3 times a week.  Pass by lab to pick up stool kit. Return at your convenience for urinalysis.  Good to see you today  Return as needed or in 1 year for next physical.  Return in 3 months for lab visit only to recheck anemia.  Follow up plan: Return in about 1 year (around 04/14/2023) for annual exam, prior fasting for blood work.  Ria Bush, MD

## 2022-04-13 NOTE — Assessment & Plan Note (Addendum)
Intermittent. Will monitor for now.

## 2022-04-13 NOTE — Assessment & Plan Note (Signed)
Preventative protocols reviewed and updated unless pt declined. Discussed healthy diet and lifestyle.  

## 2022-04-18 ENCOUNTER — Other Ambulatory Visit (INDEPENDENT_AMBULATORY_CARE_PROVIDER_SITE_OTHER): Payer: Medicaid Other

## 2022-04-18 DIAGNOSIS — D649 Anemia, unspecified: Secondary | ICD-10-CM | POA: Diagnosis not present

## 2022-04-18 LAB — URINALYSIS, ROUTINE W REFLEX MICROSCOPIC
Bilirubin Urine: NEGATIVE
Hgb urine dipstick: NEGATIVE
Ketones, ur: NEGATIVE
Leukocytes,Ua: NEGATIVE
Nitrite: NEGATIVE
RBC / HPF: NONE SEEN (ref 0–?)
Specific Gravity, Urine: <=1.005 — AB (ref 1.000–1.030)
Total Protein, Urine: NEGATIVE
Urine Glucose: NEGATIVE
Urobilinogen, UA: 0.2 (ref 0.0–1.0)
WBC, UA: NONE SEEN (ref 0–?)
pH: 6.5 (ref 5.0–8.0)

## 2022-04-18 LAB — FECAL OCCULT BLOOD, IMMUNOCHEMICAL: Fecal Occult Bld: NEGATIVE

## 2022-07-19 ENCOUNTER — Other Ambulatory Visit (INDEPENDENT_AMBULATORY_CARE_PROVIDER_SITE_OTHER): Payer: Medicaid Other

## 2022-07-19 DIAGNOSIS — D649 Anemia, unspecified: Secondary | ICD-10-CM | POA: Diagnosis not present

## 2022-07-19 LAB — CBC WITH DIFFERENTIAL/PLATELET
Basophils Absolute: 0 10*3/uL (ref 0.0–0.1)
Basophils Relative: 1.1 % (ref 0.0–3.0)
Eosinophils Absolute: 0.2 10*3/uL (ref 0.0–0.7)
Eosinophils Relative: 5 % (ref 0.0–5.0)
HCT: 39.2 % (ref 36.0–46.0)
Hemoglobin: 12.9 g/dL (ref 12.0–15.0)
Lymphocytes Relative: 31.8 % (ref 12.0–46.0)
Lymphs Abs: 1.4 10*3/uL (ref 0.7–4.0)
MCHC: 32.9 g/dL (ref 30.0–36.0)
MCV: 87.6 fl (ref 78.0–100.0)
Monocytes Absolute: 0.4 10*3/uL (ref 0.1–1.0)
Monocytes Relative: 8.7 % (ref 3.0–12.0)
Neutro Abs: 2.4 10*3/uL (ref 1.4–7.7)
Neutrophils Relative %: 53.4 % (ref 43.0–77.0)
Platelets: 309 10*3/uL (ref 150.0–400.0)
RBC: 4.48 Mil/uL (ref 3.87–5.11)
RDW: 15.9 % — ABNORMAL HIGH (ref 11.5–15.5)
WBC: 4.4 10*3/uL (ref 4.0–10.5)

## 2022-07-19 LAB — IBC PANEL
Iron: 146 ug/dL — ABNORMAL HIGH (ref 42–145)
Saturation Ratios: 37.2 % (ref 20.0–50.0)
TIBC: 392 ug/dL (ref 250.0–450.0)
Transferrin: 280 mg/dL (ref 212.0–360.0)

## 2022-07-19 LAB — FERRITIN: Ferritin: 7.9 ng/mL — ABNORMAL LOW (ref 10.0–291.0)

## 2022-07-23 ENCOUNTER — Other Ambulatory Visit: Payer: Self-pay | Admitting: Family Medicine

## 2022-07-23 MED ORDER — IRON (FERROUS SULFATE) 325 (65 FE) MG PO TABS: 325.0000 mg | ORAL_TABLET | ORAL | Status: AC

## 2022-10-18 ENCOUNTER — Other Ambulatory Visit: Payer: Self-pay | Admitting: Orthopedic Surgery

## 2022-10-18 DIAGNOSIS — M25561 Pain in right knee: Secondary | ICD-10-CM

## 2022-10-30 ENCOUNTER — Other Ambulatory Visit: Payer: Medicaid Other

## 2022-11-02 ENCOUNTER — Ambulatory Visit
Admission: RE | Admit: 2022-11-02 | Discharge: 2022-11-02 | Disposition: A | Discharge status: Home / Self Care | Payer: PRIVATE HEALTH INSURANCE | Source: Ambulatory Visit | Attending: Orthopedic Surgery | Admitting: Orthopedic Surgery

## 2022-11-02 DIAGNOSIS — M25561 Pain in right knee: Secondary | ICD-10-CM

## 2022-12-21 HISTORY — PX: KNEE ARTHROSCOPY: SUR90

## 2023-01-12 ENCOUNTER — Other Ambulatory Visit: Payer: Self-pay | Admitting: Family Medicine

## 2023-01-12 DIAGNOSIS — I10 Essential (primary) hypertension: Secondary | ICD-10-CM

## 2023-02-20 ENCOUNTER — Encounter: Payer: Self-pay | Admitting: Family Medicine

## 2023-02-22 ENCOUNTER — Encounter: Payer: Self-pay | Admitting: Family Medicine

## 2023-02-22 ENCOUNTER — Ambulatory Visit (INDEPENDENT_AMBULATORY_CARE_PROVIDER_SITE_OTHER): Payer: PRIVATE HEALTH INSURANCE | Admitting: Family Medicine

## 2023-02-22 VITALS — BP 130/86 | HR 95 | Temp 97.8°F | Ht 64.5 in | Wt 177.0 lb

## 2023-02-22 DIAGNOSIS — Z6379 Other stressful life events affecting family and household: Secondary | ICD-10-CM

## 2023-02-22 DIAGNOSIS — F4329 Adjustment disorder with other symptoms: Secondary | ICD-10-CM | POA: Diagnosis not present

## 2023-02-22 DIAGNOSIS — G4700 Insomnia, unspecified: Secondary | ICD-10-CM

## 2023-02-22 MED ORDER — TRAZODONE HCL 50 MG PO TABS: 25.0000 mg | ORAL_TABLET | Freq: Every evening | ORAL | 3 refills | Status: DC | PRN

## 2023-02-22 NOTE — Assessment & Plan Note (Addendum)
Recent stressful life events reviewed, support provided. This is worsening insomnia. See above.  Reasonable to request time off work for self care - also planning to help care for father with recently diagnosed pancreatic cancer pending surgery next week.  FMLA forms filled out requesting 6 weeks leave from work starting on Monday 02/27/2023. She will pick up when complete.

## 2023-02-22 NOTE — Assessment & Plan Note (Addendum)
Chronic, longstanding, worsening due to above.  Has tried and failed many OTC measures. Sleep hygiene measures reviewed. Rx trazodone 25-50mg  nightly, update with effect.

## 2023-02-22 NOTE — Patient Instructions (Addendum)
Try trazodone 50mg  1/2-1 tablet at night time for sleep.  Work on healthy stress relieving strategies.  We will fill out FMLA forms - and call you when ready to pick up.   Bedtime routine checklist: 1. Avoid naps during the day 2. Avoid stimulants such as caffeine and nicotine. Avoid bedtime alcohol (it can speed onset of sleep but the body's metabolism can cause awakenings). 3. All forms of exercise help ensure sound sleep - limit vigorous exercise to morning or late afternoon 4. Avoid food too close to bedtime including chocolate (which contains caffeine) 5. Soak up natural light 6. Establish regular bedtime routine. 7. Associate bed with sleep - avoid TV, computer or phone, reading while in bed. 8. Ensure pleasant, relaxing sleep environment - quiet, dark, cool room.

## 2023-02-22 NOTE — Progress Notes (Addendum)
Ph: [phone] Fax: [phone]   Patient ID: [name], female    DOB: [birth year], 47 y.o.   MRN: [id number]  This visit was conducted in person.  BP 130/86   Pulse 95   Temp 97.8 F (36.6 C) (Temporal)   Ht 5' 4.5" (1.638 m)   Wt 177 lb (80.3 kg)   LMP 02/08/2023   SpO2 98%   BMI 29.91 kg/m   BP Readings from Last 3 Encounters:  02/22/23 130/86  04/13/22 130/82  03/04/21 135/84   CC: discuss FMLA Subjective:   HPI: [name] is a 47 y.o. female presenting on 02/22/2023 for Form Completion (Wants to discuss FMLA ppw. )   Recent stressful period - father recently diagnosed with pancreatic cancer, stepson recently passed away (opiate OD), she continues recovering from R knee arthroscopy surgery 12/2022 (Dr Sherlean Foot) for microfracture with loose body removal, completed PT course continues home exercises. Was released from ortho to return to work on 12/28/2022, works from home. Poor sleep due to recent stressors.   Father has upcoming pancreatic surgery next week [facility]).   Insomnia - has previously tried melatonin 5mg , zzquil, vitamin shoppe supplement, chamomile tea without benefit.  Sleep maintenance insomnia - at times only 2 hours of sleep.  No naps during day. Has good bedtime routine.  Has restarted working out at home with stationary bicycle planning to walk around neighborhood.   Previously prescribed hydroxyzine PRN anxiety.   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  Would like to take time off to care for self and father.   Continues oral iron every other day.  Remains off birth control.      Relevant past medical, surgical, family and social history reviewed and updated as indicated. Interim medical history since our last visit reviewed. Allergies and medications reviewed and updated. Outpatient Medications Prior to Visit  Medication Sig Dispense Refill   albuterol (VENTOLIN HFA)  108 (90 Base) MCG/ACT inhaler Inhale 2 puffs into the lungs every 6 (six) hours as needed for wheezing or shortness of breath. 1 each 1   amLODipine (NORVASC) 2.5 MG tablet TAKE 1 TABLET BY MOUTH EVERY DAY 90 tablet 3   cetirizine (ZYRTEC) 10 MG tablet Take 10 mg by mouth daily. As needed     Iron, Ferrous Sulfate, 325 (65 Fe) MG TABS Take 325 mg by mouth every Monday, Wednesday, and Friday.     Multiple Vitamin (MULTIVITAMIN ADULT) TABS Take 1 tablet by mouth daily.     Drospirenone (SLYND) 4 MG TABS Take 1 tablet by mouth daily. 56 tablet 0   No facility-administered medications prior to visit.     Per HPI unless specifically indicated in ROS section below Review of Systems  Objective:  BP 130/86   Pulse 95   Temp 97.8 F (36.6 C) (Temporal)   Ht 5' 4.5" (1.638 m)   Wt 177 lb (80.3 kg)   LMP 02/08/2023   SpO2 98%   BMI 29.91 kg/m   Wt Readings from Last 3 Encounters:  02/22/23 177 lb (80.3 kg)  04/13/22 168 lb 8 oz (76.4 kg)  03/04/21 175 lb (79.4 kg)      Physical Exam Vitals and nursing note reviewed.  Constitutional:      Appearance: Normal appearance. She is not ill-appearing.  HENT:     Mouth/Throat:     Mouth: Mucous membranes are moist.     Pharynx: Oropharynx is clear.  No oropharyngeal exudate or posterior oropharyngeal erythema.  Eyes:     Extraocular Movements: Extraocular movements intact.     Pupils: Pupils are equal, round, and reactive to light.  Neck:     Thyroid: No thyroid mass or thyromegaly.  Cardiovascular:     Rate and Rhythm: Normal rate and regular rhythm.     Pulses: Normal pulses.     Heart sounds: Normal heart sounds. No murmur heard. Pulmonary:     Effort: Pulmonary effort is normal. No respiratory distress.     Breath sounds: Normal breath sounds. No wheezing, rhonchi or rales.  Musculoskeletal:     Right lower leg: No edema.     Left lower leg: No edema.  Skin:    General: Skin is warm and dry.     Findings: No rash.   Neurological:     Mental Status: She is alert.  Psychiatric:        Mood and Affect: Mood normal.        Behavior: Behavior normal.       Results for orders placed or performed in visit on 07/19/22  Ferritin  Result Value Ref Range   Ferritin 7.9 (L) 10.0 - 291.0 ng/mL  IBC panel  Result Value Ref Range   Iron 146 (H) 42 - 145 ug/dL   Transferrin 563.8 756.4 - 360.0 mg/dL   Saturation Ratios 33.2 20.0 - 50.0 %   TIBC 392.0 250.0 - 450.0 mcg/dL  CBC with Differential/Platelet  Result Value Ref Range   WBC 4.4 4.0 - 10.5 K/uL   RBC 4.48 3.87 - 5.11 Mil/uL   Hemoglobin 12.9 12.0 - 15.0 g/dL   HCT 95.1 88.4 - 16.6 %   MCV 87.6 78.0 - 100.0 fl   MCHC 32.9 30.0 - 36.0 g/dL   RDW 06.3 (H) 01.6 - 01.0 %   Platelets 309.0 150.0 - 400.0 K/uL   Neutrophils Relative % 53.4 43.0 - 77.0 %   Lymphocytes Relative 31.8 12.0 - 46.0 %   Monocytes Relative 8.7 3.0 - 12.0 %   Eosinophils Relative 5.0 0.0 - 5.0 %   Basophils Relative 1.1 0.0 - 3.0 %   Neutro Abs 2.4 1.4 - 7.7 K/uL   Lymphs Abs 1.4 0.7 - 4.0 K/uL   Monocytes Absolute 0.4 0.1 - 1.0 K/uL   Eosinophils Absolute 0.2 0.0 - 0.7 K/uL   Basophils Absolute 0.0 0.0 - 0.1 K/uL      02/22/2023    8:29 AM 04/13/2022    3:59 PM 12/29/2020    8:05 AM 11/06/2019    9:23 AM 05/15/2018    2:54 PM  Depression screen PHQ 2/9  Decreased Interest 1 0 0 0 1  Down, Depressed, Hopeless 0 0 0 0 2  PHQ - 2 Score 1 0 0 0 3  Altered sleeping 2    2  Tired, decreased energy 1    1  Change in appetite 0    0  Feeling bad or failure about yourself  1    3  Trouble concentrating 0    1  Moving slowly or fidgety/restless 0    0  Suicidal thoughts 0    1  PHQ-9 Score 5    11  Difficult doing work/chores Somewhat difficult           02/22/2023    8:29 AM 05/15/2018    2:55 PM  GAD 7 : Generalized Anxiety Score  Nervous, Anxious, on Edge 1 2  Control/stop  worrying 1 2  Worry too much - different things 1 2  Trouble relaxing 2 2  Restless 0 1   Easily annoyed or irritable 0 2  Afraid - awful might happen 1 2  Total GAD 7 Score 6 13  Anxiety Difficulty Somewhat difficult    Assessment & Plan:   Problem List Items Addressed This Visit     Stressful life events affecting family and household - Primary    Recent stressful life events reviewed, support provided. This is worsening insomnia. See above.  Reasonable to request time off work for self care - also planning to help care for father with recently diagnosed pancreatic cancer pending surgery next week.  FMLA forms filled out requesting 6 weeks leave from work starting on Monday 02/27/2023. She will pick up when complete.       Insomnia    Chronic, longstanding, worsening due to above.  Has tried and failed many OTC measures. Sleep hygiene measures reviewed. Rx trazodone 25-50mg  nightly, update with effect.       Adjustment disorder with disturbance of emotion    Due to above stressors. Support provided. Will write out of work as per above.         Meds ordered this encounter  Medications   traZODone (DESYREL) 50 MG tablet    Sig: Take 0.5-1 tablets (25-50 mg total) by mouth at bedtime as needed for sleep.    Dispense:  30 tablet    Refill:  3    No orders of the defined types were placed in this encounter.   Patient Instructions  Try trazodone 50mg  1/2-1 tablet at night time for sleep.  Work on healthy stress relieving strategies.  We will fill out FMLA forms - and call you when ready to pick up.   Bedtime routine checklist: 1. Avoid naps during the day 2. Avoid stimulants such as caffeine and nicotine. Avoid bedtime alcohol (it can speed onset of sleep but the body's metabolism can cause awakenings). 3. All forms of exercise help ensure sound sleep - limit vigorous exercise to morning or late afternoon 4. Avoid food too close to bedtime including chocolate (which contains caffeine) 5. Soak up natural light 6. Establish regular bedtime routine. 7.  Associate bed with sleep - avoid TV, computer or phone, reading while in bed. 8. Ensure pleasant, relaxing sleep environment - quiet, dark, cool room.   Follow up plan: No follow-ups on file.  Eustaquio Boyden, MD

## 2023-02-24 NOTE — Telephone Encounter (Signed)
Spoke with pt notifying her the FMLA ppw is ready to pick up. Pt expresses her thanks and will send her husband to pick it up.  [Placed ppw at front office. Made copy to scan.]

## 2023-02-24 NOTE — Telephone Encounter (Addendum)
FMLA forms in lisa's box. Plz notify pt this is ready to pick up.

## 2023-03-09 NOTE — Telephone Encounter (Signed)
Printed form and placed in Dr. Synthia Innocent box.

## 2023-03-10 NOTE — Telephone Encounter (Signed)
Received faxed STD form from [facility].   Placed form in Dr. Timoteo Expose box.

## 2023-03-17 DIAGNOSIS — F4329 Adjustment disorder with other symptoms: Secondary | ICD-10-CM | POA: Insufficient documentation

## 2023-03-17 NOTE — Assessment & Plan Note (Signed)
Due to above stressors. Support provided. Will write out of work as per above.

## 2023-03-17 NOTE — Telephone Encounter (Addendum)
Faxed form to [employer] at [phone], attn:  Christina Adjutant.   Notified pt form is ready to pick up.  [Placed form at front office. Made copy to scan.]

## 2023-03-17 NOTE — Telephone Encounter (Signed)
Filled out and in Lisa's box.  

## 2023-03-20 ENCOUNTER — Other Ambulatory Visit: Payer: Self-pay | Admitting: Family Medicine

## 2023-03-20 NOTE — Telephone Encounter (Signed)
Message from pharmacy:  REQUEST FOR 90 DAYS PRESCRIPTION.   Trazodone Last filled:  02/22/23, #30 Last OV:  02/22/23, stressful life events Next OV:  none

## 2023-03-22 NOTE — Telephone Encounter (Signed)
ERx 

## 2023-04-10 ENCOUNTER — Ambulatory Visit (INDEPENDENT_AMBULATORY_CARE_PROVIDER_SITE_OTHER): Payer: BLUE CROSS/BLUE SHIELD | Admitting: Family Medicine

## 2023-04-10 ENCOUNTER — Encounter: Payer: Self-pay | Admitting: Family Medicine

## 2023-04-10 VITALS — BP 136/84 | HR 88 | Temp 97.6°F | Ht 65.0 in | Wt 178.8 lb

## 2023-04-10 DIAGNOSIS — J22 Unspecified acute lower respiratory infection: Secondary | ICD-10-CM | POA: Diagnosis not present

## 2023-04-10 DIAGNOSIS — G4700 Insomnia, unspecified: Secondary | ICD-10-CM

## 2023-04-10 DIAGNOSIS — F4329 Adjustment disorder with other symptoms: Secondary | ICD-10-CM | POA: Diagnosis not present

## 2023-04-10 DIAGNOSIS — E611 Iron deficiency: Secondary | ICD-10-CM | POA: Diagnosis not present

## 2023-04-10 DIAGNOSIS — Z20822 Contact with and (suspected) exposure to covid-19: Secondary | ICD-10-CM | POA: Diagnosis not present

## 2023-04-10 LAB — POC COVID19 BINAXNOW: SARS Coronavirus 2 Ag: NEGATIVE

## 2023-04-10 NOTE — Assessment & Plan Note (Signed)
Ongoing family stressors causing difficulty completing job functions due to poor sleep and stress.  Will extend work leave for 2 more weeks, RTC Sept 3rd.

## 2023-04-10 NOTE — Assessment & Plan Note (Signed)
Continue oral iron MWF ?

## 2023-04-10 NOTE — Assessment & Plan Note (Signed)
Chronic, longstanding. Benefits from trazodone 25mg  nightly however still some nights ineffective - will increase to 50mg  nightly.

## 2023-04-10 NOTE — Assessment & Plan Note (Addendum)
Anticipate viral URI/pharyngitis given short duration COVID swab today negative. Supportive measures reviewed.

## 2023-04-10 NOTE — Patient Instructions (Addendum)
Bump up trazodone to 50mg  whole tablet at night time.  Letter for work provided today  COVID swab today - negative.  You have a viral upper respiratory infection. Antibiotics are not needed for this.  Viral infections usually take 7-10 days to resolve.  The cough can last a few weeks to go away. May take tylenol, ibuprofen over the counter as needed for pain and inflammation.  Push fluids and plenty of rest. Please return if you are not improving as expected, or if you have high fevers (>101.5) or difficulty swallowing or worsening productive cough. Call clinic with questions.  Good to see you today. I hope you start feeling better soon.

## 2023-04-10 NOTE — Progress Notes (Addendum)
Ph: [phone] Fax: [phone]   Patient ID: [name], female    DOB: [birth year], 47 y.o.   MRN: [id number]  This visit was conducted in person.  BP 136/84   Pulse 88   Temp 97.6 F (36.4 C) (Temporal)   Ht 5\' 5"  (1.651 m)   Wt 178 lb 12.8 oz (81.1 kg)   LMP 04/08/2023   BMI 29.75 kg/m    CC: 6 wk f/u visit  Subjective:   HPI: [name] is a 47 y.o. female presenting on 04/10/2023 for Follow-up   See prior note for details.  stressful period - father recently diagnosed with pancreatic cancer, stepson recently passed away (opiate OD), she continues recovering from R knee arthroscopy surgery 12/2022 (Dr Sherlean Foot) for microfracture with loose body removal, completed PT course continues home exercises. Works from home. Poor sleep due to recent stressors.   Ongoing stressful life events.  Father had pancreatic surgery last month - tumor removed but cancer not fully gone.  More recently daughter's car was broken.   Insomnia - has previously tried melatonin 5mg , zzquil, vitamin shoppe supplement, chamomile tea without benefit.  Sleep maintenance insomnia - at times only 2 hours of sleep.  No naps during day. Has good bedtime routine.  Has restarted working out at home with stationary bicycle planning to walk around neighborhood.  Last visit we started trazodone 25mg  nightly - hit or miss benefit. Averaging 5 hours of sleep at night.   Requests further extension through Labor day.  Doesn't want to include care of father.   Previously prescribed hydroxyzine PRN anxiety.    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   Today started feeling ill - ST, PNDrainage, cough. No fever.  Hasn't tried anything for this yet.  Husband has been sick with fever, cough, ST.      Relevant past medical, surgical, family and social history reviewed and updated as indicated. Interim medical history since our last visit  reviewed. Allergies and medications reviewed and updated. Outpatient Medications Prior to Visit  Medication Sig Dispense Refill   albuterol (VENTOLIN HFA) 108 (90 Base) MCG/ACT inhaler Inhale 2 puffs into the lungs every 6 (six) hours as needed for wheezing or shortness of breath. 1 each 1   amLODipine (NORVASC) 2.5 MG tablet TAKE 1 TABLET BY MOUTH EVERY DAY 90 tablet 3   cetirizine (ZYRTEC) 10 MG tablet Take 10 mg by mouth daily. As needed     Iron, Ferrous Sulfate, 325 (65 Fe) MG TABS Take 325 mg by mouth every Monday, Wednesday, and Friday.     Multiple Vitamin (MULTIVITAMIN ADULT) TABS Take 1 tablet by mouth daily.     traZODone (DESYREL) 50 MG tablet Take 1 tablet (50 mg total) by mouth at bedtime. 90 tablet 3   No facility-administered medications prior to visit.     Per HPI unless specifically indicated in ROS section below Review of Systems  Objective:  BP 136/84   Pulse 88   Temp 97.6 F (36.4 C) (Temporal)   Ht 5\' 5"  (1.651 m)   Wt 178 lb 12.8 oz (81.1 kg)   LMP 04/08/2023   BMI 29.75 kg/m   Wt Readings from Last 3 Encounters:  04/10/23 178 lb 12.8 oz (81.1 kg)  02/22/23 177 lb (80.3 kg)  04/13/22 168 lb 8 oz (76.4 kg)      Physical Exam Vitals and nursing note reviewed.  Constitutional:  Appearance: Normal appearance. She is not ill-appearing.  HENT:     Head: Normocephalic and atraumatic.     Right Ear: Tympanic membrane, ear canal and external ear normal. There is no impacted cerumen.     Left Ear: Tympanic membrane, ear canal and external ear normal. There is no impacted cerumen.     Nose: Congestion present. No rhinorrhea.     Mouth/Throat:     Mouth: Mucous membranes are moist.     Pharynx: Oropharynx is clear. No oropharyngeal exudate or posterior oropharyngeal erythema.  Eyes:     Extraocular Movements: Extraocular movements intact.     Conjunctiva/sclera: Conjunctivae normal.     Pupils: Pupils are equal, round, and reactive to light.   Cardiovascular:     Rate and Rhythm: Normal rate and regular rhythm.     Pulses: Normal pulses.     Heart sounds: Normal heart sounds. No murmur heard. Pulmonary:     Effort: Pulmonary effort is normal. No respiratory distress.     Breath sounds: Normal breath sounds. No wheezing, rhonchi or rales.     Comments: Lungs clear Lymphadenopathy:     Head:     Right side of head: No submental, submandibular, tonsillar, preauricular or posterior auricular adenopathy.     Left side of head: No submental, submandibular, tonsillar, preauricular or posterior auricular adenopathy.     Cervical: No cervical adenopathy.     Right cervical: No superficial cervical adenopathy.    Left cervical: No superficial cervical adenopathy.     Upper Body:     Right upper body: No supraclavicular adenopathy.     Left upper body: No supraclavicular adenopathy.  Skin:    Findings: No rash.  Neurological:     Mental Status: She is alert.  Psychiatric:        Mood and Affect: Mood normal.        Behavior: Behavior normal.       Results for orders placed or performed in visit on 04/10/23  POC COVID-19  Result Value Ref Range   SARS Coronavirus 2 Ag Negative Negative    Assessment & Plan:   Problem List Items Addressed This Visit     Insomnia    Chronic, longstanding. Benefits from trazodone 25mg  nightly however still some nights ineffective - will increase to 50mg  nightly.       Adjustment disorder with disturbance of emotion    Ongoing family stressors causing difficulty completing job functions due to poor sleep and stress.  Will extend work leave for 2 more weeks, RTC Sept 3rd.       Acute respiratory infection - Primary    Anticipate viral URI/pharyngitis given short duration COVID swab today negative. Supportive measures reviewed.       Iron deficiency    Continue oral iron MWF.       Other Visit Diagnoses     Exposure to COVID-19 virus       Relevant Orders   POC COVID-19  (Completed)        No orders of the defined types were placed in this encounter.   Orders Placed This Encounter  Procedures   POC COVID-19    Order Specific Question:   Previously tested for COVID-19    Answer:   No    Order Specific Question:   Resident in a congregate (group) care setting    Answer:   Unknown    Order Specific Question:   Employed in healthcare setting  Answer:   Unknown    Order Specific Question:   Pregnant    Answer:   Unknown    Patient Instructions  Bump up trazodone to 50mg  whole tablet at night time.  Letter for work provided today  COVID swab today - negative.  You have a viral upper respiratory infection. Antibiotics are not needed for this.  Viral infections usually take 7-10 days to resolve.  The cough can last a few weeks to go away. May take tylenol, ibuprofen over the counter as needed for pain and inflammation.  Push fluids and plenty of rest. Please return if you are not improving as expected, or if you have high fevers (>101.5) or difficulty swallowing or worsening productive cough. Call clinic with questions.  Good to see you today. I hope you start feeling better soon.   Follow up plan: Return if symptoms worsen or fail to improve.  Eustaquio Boyden, MD

## 2023-04-10 NOTE — Addendum Note (Signed)
Addended by: Donnamarie Poag on: 04/10/2023 03:32 PM   Modules accepted: Orders

## 2023-04-11 NOTE — Telephone Encounter (Signed)
Seen in office yesterday

## 2023-04-26 IMAGING — MG MM DIGITAL SCREENING BILAT W/ TOMO AND CAD
8 series · 8 of 24 positions shown · non-contrast
Comparison: None.

CLINICAL DATA: Screening.

EXAM:
DIGITAL SCREENING BILATERAL MAMMOGRAM WITH TOMOSYNTHESIS AND CAD
TECHNIQUE: Bilateral screening digital craniocaudal and mediolateral oblique
mammograms were obtained. Bilateral screening digital breast
tomosynthesis was performed. The images were evaluated with
computer-aided detection.

[R CC synth-2D]
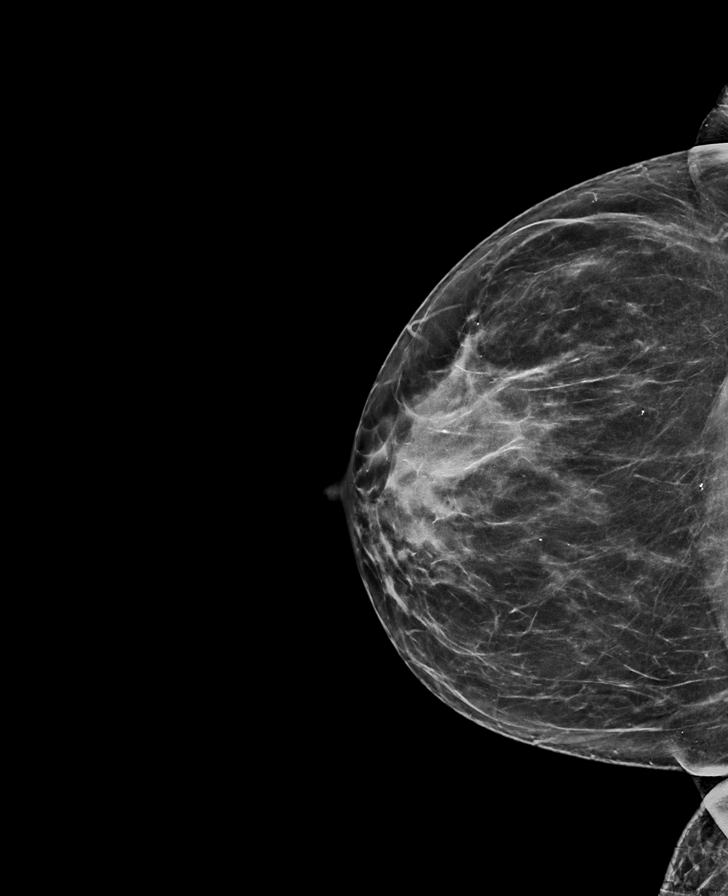

[L CC synth-2D]
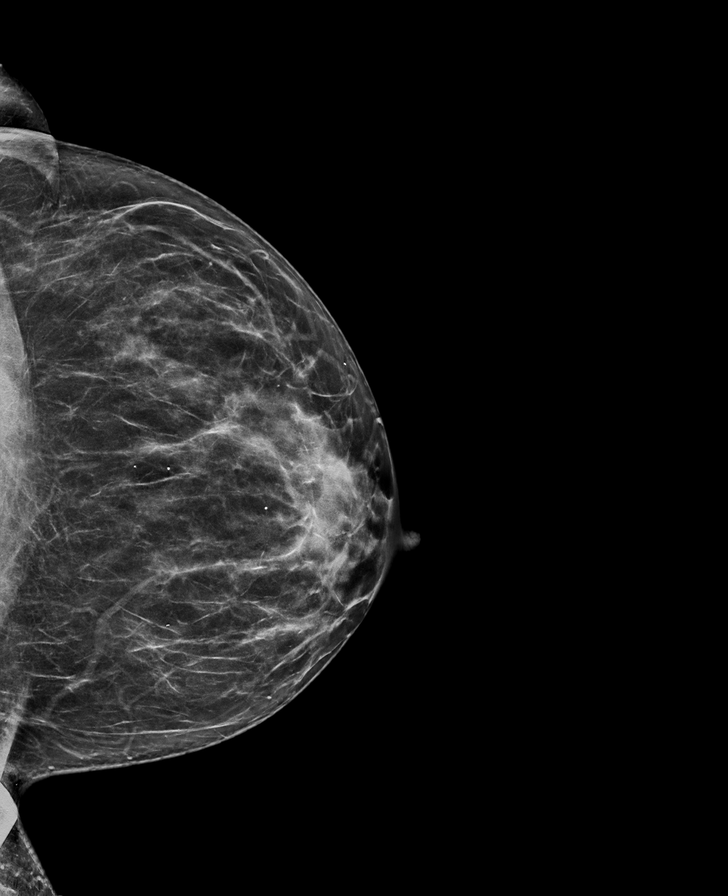

[L MLO synth-2D]
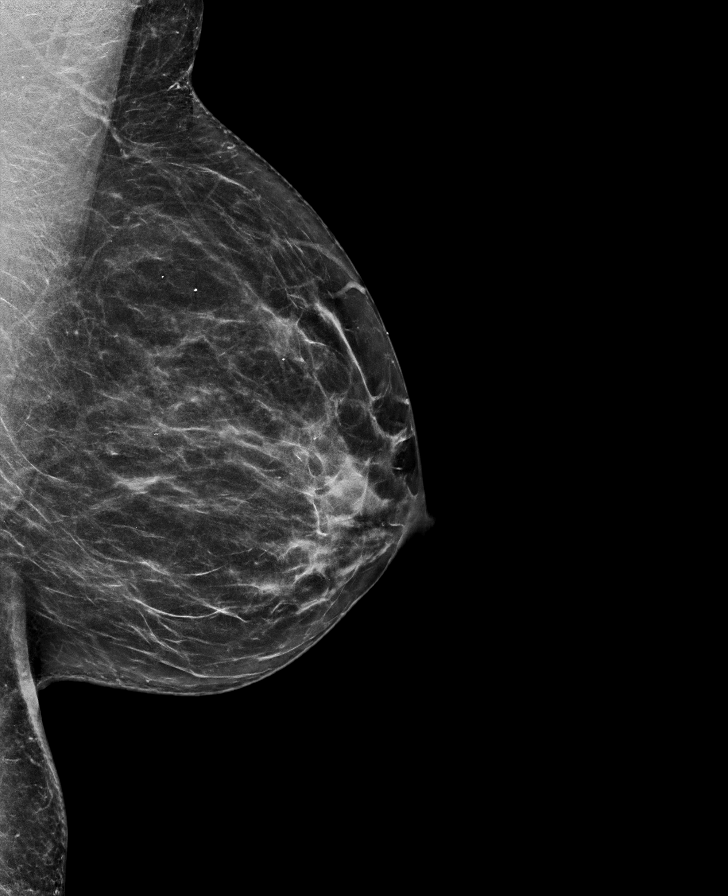

[R MLO synth-2D]
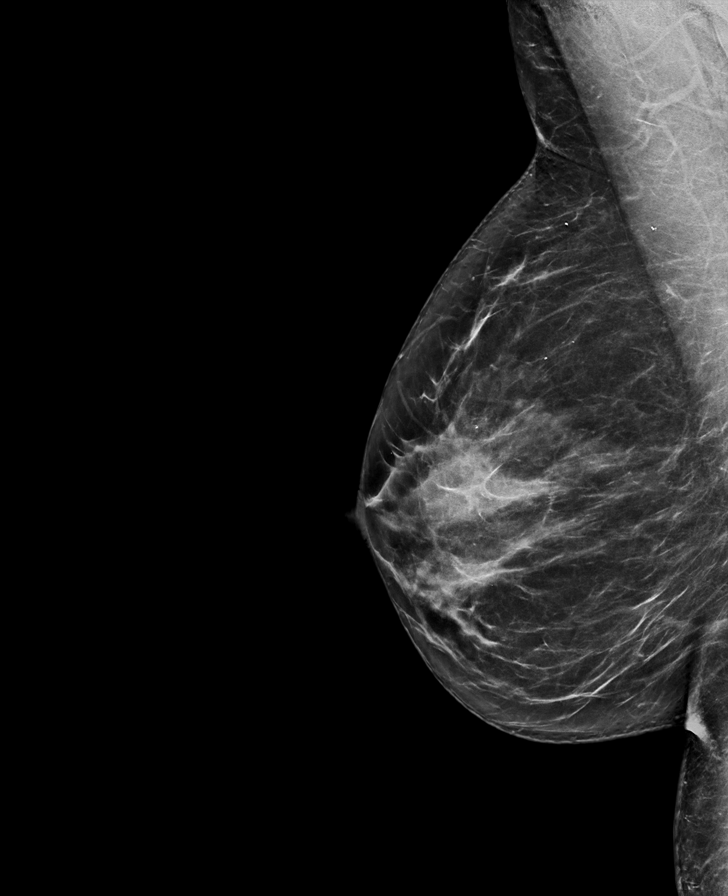

[R MLO tomo · tomo slice 37/74.0]
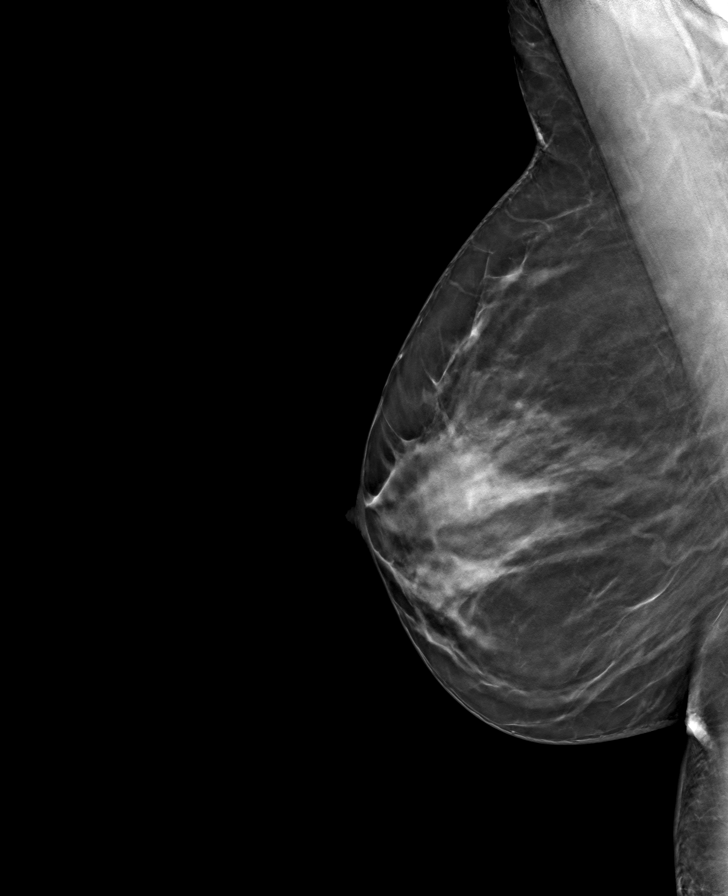

[L MLO tomo · tomo slice 35/69.0]
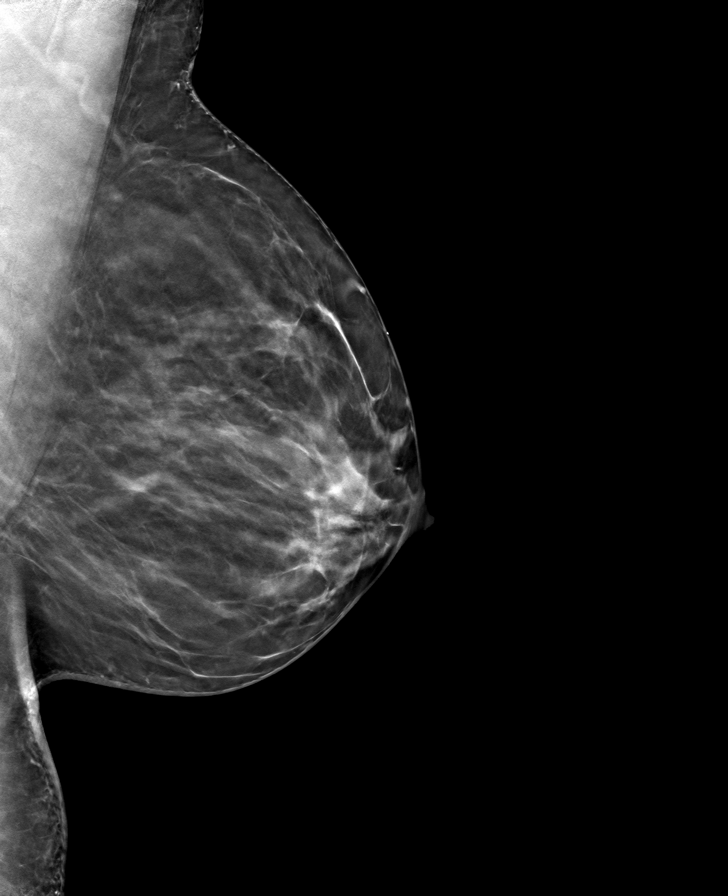

[R CC tomo · tomo slice 34/67.0]
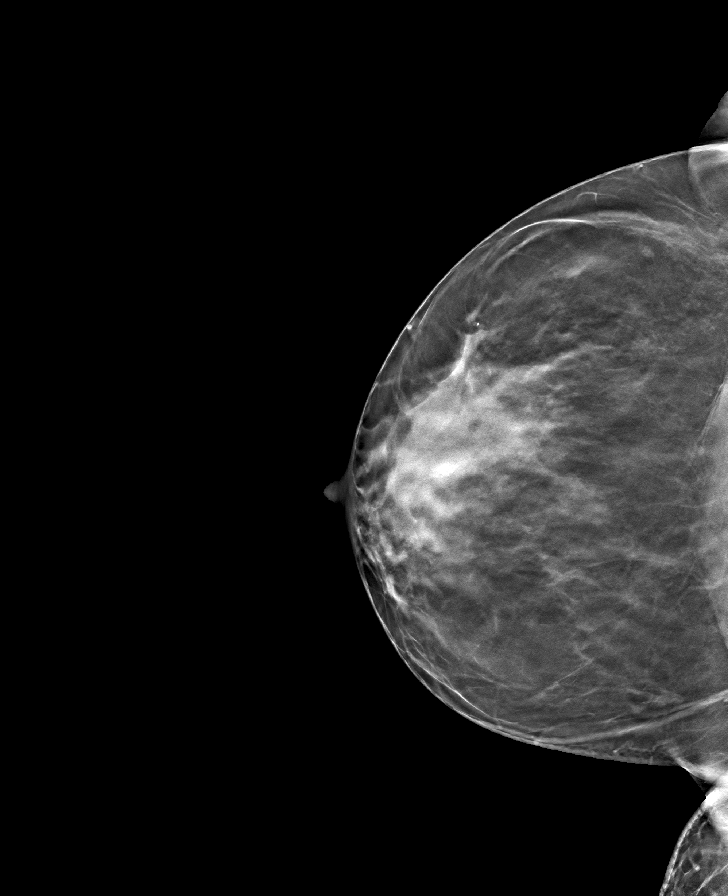

[L CC tomo · tomo slice 33/66.0]
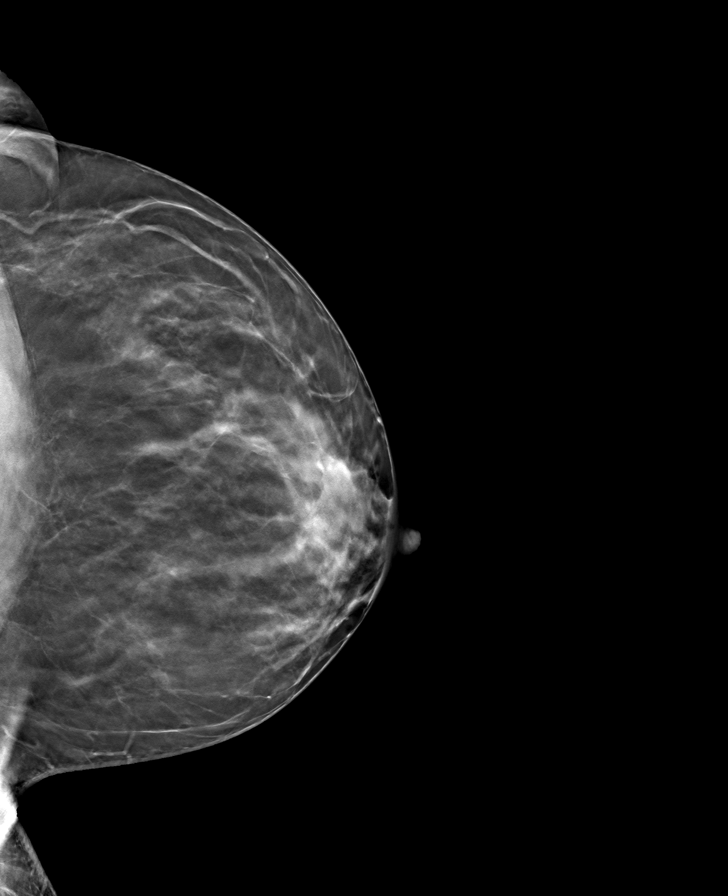

[8 of 24 positions shown; findings below may reference images not displayed]

Baseline

ACR Breast Density Category c: The breast tissue is heterogeneously
dense, which may obscure small masses
FINDINGS: There are no findings suspicious for malignancy.
IMPRESSION: No mammographic evidence of malignancy. A result letter of this
screening mammogram will be mailed directly to the patient.

RECOMMENDATION:
Screening mammogram in one year. (Code:HZ-A-812)

BI-RADS CATEGORY  1: Negative.

## 2023-06-05 ENCOUNTER — Encounter: Payer: Self-pay | Admitting: Family Medicine

## 2023-06-05 ENCOUNTER — Ambulatory Visit (INDEPENDENT_AMBULATORY_CARE_PROVIDER_SITE_OTHER): Payer: 59 | Admitting: Family Medicine

## 2023-06-05 VITALS — BP 138/84 | HR 88 | Temp 98.7°F | Ht 65.0 in | Wt 182.4 lb

## 2023-06-05 DIAGNOSIS — I10 Essential (primary) hypertension: Secondary | ICD-10-CM | POA: Diagnosis not present

## 2023-06-05 DIAGNOSIS — R82998 Other abnormal findings in urine: Secondary | ICD-10-CM

## 2023-06-05 LAB — POC URINALSYSI DIPSTICK (AUTOMATED)
Bilirubin, UA: NEGATIVE
Blood, UA: NEGATIVE
Glucose, UA: NEGATIVE
Ketones, UA: NEGATIVE
Leukocytes, UA: NEGATIVE
Nitrite, UA: NEGATIVE
Protein, UA: NEGATIVE
Spec Grav, UA: 1.01 (ref 1.010–1.025)
Urobilinogen, UA: 0.2 U/dL
pH, UA: 6.5 (ref 5.0–8.0)

## 2023-06-05 LAB — MICROALBUMIN / CREATININE URINE RATIO
Creatinine,U: 52.7 mg/dL
Microalb Creat Ratio: 1.3 mg/g (ref 0.0–30.0)
Microalb, Ur: <0.7 mg/dL (ref 0.0–1.9)

## 2023-06-05 MED ORDER — HYDROXYZINE HCL 25 MG PO TABS: 25.0000 mg | ORAL_TABLET | Freq: Every evening | ORAL | 0 refills | Status: DC | PRN

## 2023-06-05 NOTE — Patient Instructions (Addendum)
Urinalysis returned normal. I'll send urine microalbumin test (more detailed test).  Caution with caffeine.  Increase water intake.  We will check labwork at physical.  Let us know if worsening symptoms.

## 2023-06-05 NOTE — Assessment & Plan Note (Signed)
UA today normal.  Check Umicroalb. Check labwork at CPE in 2 months. Update sooner if any new or worsening symptoms. Continue good hydration status, discussed limiting caffeine.

## 2023-06-05 NOTE — Progress Notes (Signed)
Ph: [phone] Fax: [phone]   Patient ID: [name], female    DOB: 03-31-1976, 47 y.o.   MRN: [id number]  This visit was conducted in person.  BP 138/84   Pulse 88   Temp 98.7 F (37.1 C) (Oral)   Ht 5\' 5"  (1.651 m)   Wt 182 lb 6 oz (82.7 kg)   LMP 05/08/2023   SpO2 97%   BMI 30.35 kg/m   BP Readings from Last 3 Encounters:  06/05/23 138/84  04/10/23 136/84  02/22/23 130/86   CC: foamy urine Subjective:   HPI: [name] is a 47 y.o. female presenting on 06/05/2023 for Urinary Problem (C/o sudsy, foamy urine. Denies any other sxs. Noticed about 2 wks ago. )   2 wk h/o foamy urine noted "bubbles in the urine".  No dysuria, frequency, urgency, hematuria. No abd pain, flank pain, back pain. No fevers/chills, nausea, vomiting. No leg swelling.   No diet changes.  Drinks caffeine - 2 cups of coffee (increased from 1 previously).   Home BP running well. She stopped trazodone as it may have caused elevated BP readings. Continued insomnia which worsens anxiety. Zzquil intermittently helpful. Melatonin 5mg  didn't help. Trouble tolerating magnesium.      Relevant past medical, surgical, family and social history reviewed and updated as indicated. Interim medical history since our last visit reviewed. Allergies and medications reviewed and updated. Outpatient Medications Prior to Visit  Medication Sig Dispense Refill   albuterol (VENTOLIN HFA) 108 (90 Base) MCG/ACT inhaler Inhale 2 puffs into the lungs every 6 (six) hours as needed for wheezing or shortness of breath. 1 each 1   amLODipine (NORVASC) 2.5 MG tablet TAKE 1 TABLET BY MOUTH EVERY DAY 90 tablet 3   cetirizine (ZYRTEC) 10 MG tablet Take 10 mg by mouth daily. As needed     Iron, Ferrous Sulfate, 325 (65 Fe) MG TABS Take 325 mg by mouth every Monday, Wednesday, and Friday.     Multiple Vitamin (MULTIVITAMIN ADULT) TABS Take 1 tablet by mouth daily.     traZODone (DESYREL) 50 MG tablet Take 1 tablet (50  mg total) by mouth at bedtime. 90 tablet 3   No facility-administered medications prior to visit.     Per HPI unless specifically indicated in ROS section below Review of Systems  Objective:  BP 138/84   Pulse 88   Temp 98.7 F (37.1 C) (Oral)   Ht 5\' 5"  (1.651 m)   Wt 182 lb 6 oz (82.7 kg)   LMP 05/08/2023   SpO2 97%   BMI 30.35 kg/m   Wt Readings from Last 3 Encounters:  06/05/23 182 lb 6 oz (82.7 kg)  04/10/23 178 lb 12.8 oz (81.1 kg)  02/22/23 177 lb (80.3 kg)      Physical Exam Vitals and nursing note reviewed.  Constitutional:      Appearance: Normal appearance. She is not ill-appearing.  HENT:     Head: Normocephalic and atraumatic.  Eyes:     Extraocular Movements: Extraocular movements intact.     Conjunctiva/sclera: Conjunctivae normal.     Pupils: Pupils are equal, round, and reactive to light.  Cardiovascular:     Rate and Rhythm: Normal rate and regular rhythm.     Pulses: Normal pulses.     Heart sounds: Normal heart sounds. No murmur heard. Pulmonary:     Effort: Pulmonary effort is normal. No respiratory distress.     Breath sounds: Normal breath sounds. No wheezing,  rhonchi or rales.  Abdominal:     General: Bowel sounds are normal. There is no distension.     Palpations: Abdomen is soft. There is no mass.     Tenderness: There is no abdominal tenderness. There is no right CVA tenderness, left CVA tenderness, guarding or rebound.     Hernia: No hernia is present.  Musculoskeletal:     Right lower leg: No edema.     Left lower leg: No edema.  Skin:    General: Skin is warm and dry.     Findings: No rash.  Neurological:     Mental Status: She is alert.  Psychiatric:        Mood and Affect: Mood normal.        Behavior: Behavior normal.       Results for orders placed or performed in visit on 06/05/23  POCT Urinalysis Dipstick (Automated)  Result Value Ref Range   Color, UA yellow    Clarity, UA clear    Glucose, UA Negative Negative    Bilirubin, UA negative    Ketones, UA negative    Spec Grav, UA 1.010 1.010 - 1.025   Blood, UA negative    pH, UA 6.5 5.0 - 8.0   Protein, UA Negative Negative   Urobilinogen, UA 0.2 0.2 or 1.0 E.U./dL   Nitrite, UA negative    Leukocytes, UA Negative Negative   Lab Results  Component Value Date   NA 136 04/12/2022   CL 105 04/12/2022   K 3.7 04/12/2022   CO2 24 04/12/2022   BUN 12 04/12/2022   CREATININE 0.84 04/12/2022   GFR 84.83 04/12/2022   CALCIUM 8.5 04/12/2022   ALBUMIN 3.9 04/12/2022   GLUCOSE 88 04/12/2022    Assessment & Plan:   Problem List Items Addressed This Visit     Hypertension, essential    Chronic, stable on low dose amlodipine. Update Umicroalb      Relevant Orders   Microalbumin / creatinine urine ratio   Foamy urine - Primary    UA today normal.  Check Umicroalb. Check labwork at CPE in 2 months. Update sooner if any new or worsening symptoms. Continue good hydration status, discussed limiting caffeine.      Relevant Orders   POCT Urinalysis Dipstick (Automated) (Completed)   Microalbumin / creatinine urine ratio     Meds ordered this encounter  Medications   hydrOXYzine (ATARAX) 25 MG tablet    Sig: Take 1 tablet (25 mg total) by mouth at bedtime as needed (sleep).    Dispense:  30 tablet    Refill:  0    Orders Placed This Encounter  Procedures   Microalbumin / creatinine urine ratio   POCT Urinalysis Dipstick (Automated)    Patient Instructions  Urinalysis returned normal. I'll send urine microalbumin test (more detailed test).  Caution with caffeine.  Increase water intake.  We will check labwork at physical.  Let us know if worsening symptoms.   Follow up plan: Return if symptoms worsen or fail to improve.  Eustaquio Boyden, MD

## 2023-06-05 NOTE — Assessment & Plan Note (Signed)
Chronic, stable on low dose amlodipine. Update Umicroalb

## 2023-06-27 ENCOUNTER — Other Ambulatory Visit: Payer: Self-pay | Admitting: Family Medicine

## 2023-08-03 ENCOUNTER — Encounter: Payer: Self-pay | Admitting: Family Medicine

## 2023-08-03 NOTE — Telephone Encounter (Signed)
Placed ppw in Dr. G's box.  ?

## 2023-08-03 NOTE — Telephone Encounter (Signed)
Patient dropped off document FMLA, to be filled out by provider. Patient requested to send it back via Call Patient to pick up within ASAP. Document is located in providers tray at front office.Please advise at Mobile [phone] (mobile)

## 2023-08-09 NOTE — Telephone Encounter (Signed)
Received call from E2C2 patient called states ppw is due tomorrow 08/10/23 and wanted to see if done at this time.

## 2023-08-10 NOTE — Telephone Encounter (Signed)
Reason for CRM: Patient stated wanting to know the status of her FMLA paperwork, is requesting a call back   Spoke with patient and informed her that he is working on it. She stated that they are needing it today. She stated that it can be emailed to: [email or web address].

## 2023-08-10 NOTE — Telephone Encounter (Addendum)
I have finished it but am out of the office today. Will be placed in Lisa's box tomorrow am.   Also, this FMLA was predominantly to care for her father - if further details needed ie frequency of chemo treatment etc, will need to come from her father's doctor.

## 2023-08-10 NOTE — Telephone Encounter (Signed)
Spoke with pt relaying Dr Timoteo Expose message and informing her we can put it up front for her to pick up. Pt verbalizes understanding and requests a MyChart message when ppw is ready to pick up.

## 2023-08-11 DIAGNOSIS — Z0279 Encounter for issue of other medical certificate: Secondary | ICD-10-CM

## 2023-08-11 NOTE — Telephone Encounter (Signed)
Placed FMLA ppw at front office. Pt is aware of $29.00 fee.

## 2023-08-11 NOTE — Telephone Encounter (Signed)
Form placed in [name]'s box.  I filled out FMLA to account for medical appointments 3x/wk each 4 hours in duration. To let me know if also needs any other FMLA ie any forseeable extended period out of work caring for father.

## 2023-08-17 ENCOUNTER — Other Ambulatory Visit: Payer: Self-pay | Admitting: Family Medicine

## 2023-08-17 DIAGNOSIS — E611 Iron deficiency: Secondary | ICD-10-CM

## 2023-08-17 DIAGNOSIS — D649 Anemia, unspecified: Secondary | ICD-10-CM

## 2023-08-17 DIAGNOSIS — I10 Essential (primary) hypertension: Secondary | ICD-10-CM

## 2023-08-21 ENCOUNTER — Other Ambulatory Visit (INDEPENDENT_AMBULATORY_CARE_PROVIDER_SITE_OTHER): Payer: 59

## 2023-08-21 DIAGNOSIS — E611 Iron deficiency: Secondary | ICD-10-CM

## 2023-08-21 DIAGNOSIS — D649 Anemia, unspecified: Secondary | ICD-10-CM | POA: Diagnosis not present

## 2023-08-21 DIAGNOSIS — I10 Essential (primary) hypertension: Secondary | ICD-10-CM

## 2023-08-21 LAB — CBC WITH DIFFERENTIAL/PLATELET
Basophils Absolute: 0 10*3/uL (ref 0.0–0.1)
Basophils Relative: 0.8 % (ref 0.0–3.0)
Eosinophils Absolute: 0.4 10*3/uL (ref 0.0–0.7)
Eosinophils Relative: 6.9 % — ABNORMAL HIGH (ref 0.0–5.0)
HCT: 40.7 % (ref 36.0–46.0)
Hemoglobin: 13.5 g/dL (ref 12.0–15.0)
Lymphocytes Relative: 31 % (ref 12.0–46.0)
Lymphs Abs: 1.7 10*3/uL (ref 0.7–4.0)
MCHC: 33.2 g/dL (ref 30.0–36.0)
MCV: 94.4 fL (ref 78.0–100.0)
Monocytes Absolute: 0.5 10*3/uL (ref 0.1–1.0)
Monocytes Relative: 8.8 % (ref 3.0–12.0)
Neutro Abs: 3 10*3/uL (ref 1.4–7.7)
Neutrophils Relative %: 52.5 % (ref 43.0–77.0)
Platelets: 252 10*3/uL (ref 150.0–400.0)
RBC: 4.3 Mil/uL (ref 3.87–5.11)
RDW: 12.9 % (ref 11.5–15.5)
WBC: 5.6 10*3/uL (ref 4.0–10.5)

## 2023-08-21 LAB — BASIC METABOLIC PANEL
BUN: 10 mg/dL (ref 6–23)
CO2: 27 meq/L (ref 19–32)
Calcium: 8.8 mg/dL (ref 8.4–10.5)
Chloride: 103 meq/L (ref 96–112)
Creatinine, Ser: 0.84 mg/dL (ref 0.40–1.20)
GFR: 84.03 mL/min (ref >60.00)
Glucose, Bld: 88 mg/dL (ref 70–99)
Potassium: 4.2 meq/L (ref 3.5–5.1)
Sodium: 138 meq/L (ref 135–145)

## 2023-08-21 LAB — LIPID PANEL
Cholesterol: 196 mg/dL (ref 0–200)
HDL: 72.4 mg/dL (ref >39.00)
LDL Cholesterol: 111 mg/dL — ABNORMAL HIGH (ref 0–99)
NonHDL: 123.92
Total CHOL/HDL Ratio: 3
Triglycerides: 64 mg/dL (ref 0.0–149.0)
VLDL: 12.8 mg/dL (ref 0.0–40.0)

## 2023-08-21 LAB — IBC PANEL
Iron: 112 ug/dL (ref 42–145)
Saturation Ratios: 31.5 % (ref 20.0–50.0)
TIBC: 355.6 ug/dL (ref 250.0–450.0)
Transferrin: 254 mg/dL (ref 212.0–360.0)

## 2023-08-21 LAB — FERRITIN: Ferritin: 16.8 ng/mL (ref 10.0–291.0)

## 2023-08-28 ENCOUNTER — Other Ambulatory Visit (INDEPENDENT_AMBULATORY_CARE_PROVIDER_SITE_OTHER): Payer: Self-pay

## 2023-08-28 ENCOUNTER — Encounter: Payer: Self-pay | Admitting: Family Medicine

## 2023-08-28 ENCOUNTER — Ambulatory Visit (INDEPENDENT_AMBULATORY_CARE_PROVIDER_SITE_OTHER): Payer: 59 | Admitting: Family Medicine

## 2023-08-28 VITALS — BP 128/84 | HR 83 | Temp 98.6°F | Resp 18 | Ht 64.5 in | Wt 181.1 lb

## 2023-08-28 DIAGNOSIS — D6859 Other primary thrombophilia: Secondary | ICD-10-CM | POA: Diagnosis not present

## 2023-08-28 DIAGNOSIS — Z1211 Encounter for screening for malignant neoplasm of colon: Secondary | ICD-10-CM | POA: Diagnosis not present

## 2023-08-28 DIAGNOSIS — Z6379 Other stressful life events affecting family and household: Secondary | ICD-10-CM

## 2023-08-28 DIAGNOSIS — I10 Essential (primary) hypertension: Secondary | ICD-10-CM

## 2023-08-28 DIAGNOSIS — E611 Iron deficiency: Secondary | ICD-10-CM

## 2023-08-28 DIAGNOSIS — Z0000 Encounter for general adult medical examination without abnormal findings: Secondary | ICD-10-CM

## 2023-08-28 MED ORDER — AMLODIPINE BESYLATE 2.5 MG PO TABS: 2.5000 mg | ORAL_TABLET | Freq: Every day | ORAL | 4 refills | Status: DC

## 2023-08-28 NOTE — Assessment & Plan Note (Signed)
 Stable readings on MWF oral iron, tolerating well  - continue.

## 2023-08-28 NOTE — Assessment & Plan Note (Signed)
 Preventative protocols reviewed and updated unless pt declined. Discussed healthy diet and lifestyle.

## 2023-08-28 NOTE — Assessment & Plan Note (Signed)
 Continues caring for father - helps him with transportation to medical appts - about to restart chemo

## 2023-08-28 NOTE — Patient Instructions (Addendum)
 Pass by lab to pick up stool kit.  Call to schedule mammogram at your convenience: [facility] [phone] Schedule dentist appointment at your convenience  Good to see you today Return as needed or in 1 year for next physical

## 2023-08-28 NOTE — Assessment & Plan Note (Signed)
 No personal hx blood clots.

## 2023-08-28 NOTE — Progress Notes (Signed)
 Ph: [phone] Fax: [phone]   Patient ID: [name], female    DOB: 1976/08/16, 48 y.o.   MRN: [id number]  This visit was conducted in person.  BP 128/84   Pulse 83   Temp 98.6 F (37 C) (Oral)   Resp 18   Ht 5' 4.5 (1.638 m)   Wt 181 lb 2 oz (82.2 kg)   SpO2 99%   BMI 30.61 kg/m    CC: CPE Subjective:   HPI: [name] is a 48 y.o. female presenting on 08/28/2023 for Annual Exam   Father going through cancer treatment. He just finished XRT planning to restart chemo.   H/o protein C deficiency.  H/o reactive airways during allergy season, rarely uses PRN albuterol .    Preventative: COLONOSCOPY 12/2012 - mod diverticulosis [place])  Well woman exam with OBGYN Dr Herchel with normal pap last time. Next appt scheduled 09/21/2023.  LMP 08/05/2023 Mammogram 02/2021 Birads1 @ [place] DEXA scan - not due  Flu shot - declines COVID vaccine Pfizer 03/2020, 04/2020, booster x1 Tdap 04/2017 Pneumonia shot - not due Shingrix - not due Seat belt use discussed  Sunscreen use discussed. No changing moles on skin.  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 Interim medical history since our last visit reviewed. Allergies and medications reviewed and updated. Outpatient Medications Prior to Visit  Medication Sig Dispense Refill   albuterol  (VENTOLIN  HFA) 108 (90 Base) MCG/ACT inhaler Inhale 2 puffs into the lungs every 6 (six) hours as needed for wheezing or shortness of breath. 1 each 1   cetirizine (ZYRTEC) 10 MG tablet Take 10 mg by mouth daily. As needed      hydrOXYzine  (ATARAX ) 25 MG tablet TAKE 1 TABLET (25 MG TOTAL) BY MOUTH AT BEDTIME AS NEEDED FOR SLEEP 90 tablet 1   Iron , Ferrous Sulfate , 325 (65 Fe) MG TABS Take 325 mg by mouth every Monday, Wednesday, and Friday.     Multiple Vitamin (MULTIVITAMIN ADULT) TABS Take 1 tablet by mouth daily.     amLODipine  (NORVASC ) 2.5 MG tablet TAKE 1 TABLET BY MOUTH EVERY DAY 90 tablet 3   No facility-administered medications prior to visit.     Per HPI unless specifically indicated in ROS section below Review of Systems  Constitutional:  Negative for activity change, appetite change, chills, fatigue, fever and unexpected weight change.  HENT:  Negative for hearing loss.   Eyes:  Negative for visual disturbance.  Respiratory:  Negative for cough, chest tightness, shortness of breath and wheezing.   Cardiovascular:  Negative for chest pain, palpitations and leg swelling.  Gastrointestinal:  Negative for abdominal distention, abdominal pain, blood in stool, constipation, diarrhea, nausea and vomiting.  Genitourinary:  Negative for difficulty urinating and hematuria.  Musculoskeletal:  Negative for arthralgias, myalgias and neck pain.  Skin:  Negative for rash.  Neurological:  Negative for dizziness, seizures, syncope and headaches.  Hematological:  Negative for adenopathy. Does not bruise/bleed easily.  Psychiatric/Behavioral:  Negative for dysphoric mood. The patient is not nervous/anxious.     Objective:  BP 128/84  Pulse 83   Temp 98.6 F (37 C) (Oral)   Resp 18   Ht 5' 4.5 (1.638 m)   Wt 181 lb 2 oz (82.2 kg)   SpO2 99%   BMI 30.61 kg/m   Wt Readings from Last 3 Encounters:  08/28/23 181 lb 2 oz (82.2 kg)  06/05/23 182 lb 6 oz (82.7 kg)  04/10/23 178 lb 12.8 oz (81.1 kg)      Physical Exam Vitals and nursing note reviewed.  Constitutional:      Appearance: Normal appearance. She is not ill-appearing.  HENT:     Head: Normocephalic and atraumatic.     Right Ear: Tympanic  membrane, ear canal and external ear normal. There is no impacted cerumen.     Left Ear: Tympanic membrane, ear canal and external ear normal. There is no impacted cerumen.     Mouth/Throat:     Mouth: Mucous membranes are moist.     Pharynx: Oropharynx is clear. No oropharyngeal exudate or posterior oropharyngeal erythema.  Eyes:     General:        Right eye: No discharge.        Left eye: No discharge.     Extraocular Movements: Extraocular movements intact.     Conjunctiva/sclera: Conjunctivae normal.     Pupils: Pupils are equal, round, and reactive to light.  Neck:     Thyroid : No thyroid  mass or thyromegaly.  Cardiovascular:     Rate and Rhythm: Normal rate and regular rhythm.     Pulses: Normal pulses.     Heart sounds: Normal heart sounds. No murmur heard. Pulmonary:     Effort: Pulmonary effort is normal. No respiratory distress.     Breath sounds: Normal breath sounds. No wheezing, rhonchi or rales.  Abdominal:     General: Bowel sounds are normal. There is no distension.     Palpations: Abdomen is soft. There is no mass.     Tenderness: There is no abdominal tenderness. There is no guarding or rebound.     Hernia: No hernia is present.  Musculoskeletal:     Cervical back: Normal range of motion and neck supple. No rigidity.     Right lower leg: No edema.     Left lower leg: No edema.  Lymphadenopathy:     Cervical: No cervical adenopathy.  Skin:    General: Skin is warm and dry.     Findings: No rash.  Neurological:     General: No focal deficit present.     Mental Status: She is alert. Mental status is at baseline.  Psychiatric:        Mood and Affect: Mood normal.        Behavior: Behavior normal.       Results for orders placed or performed in visit on 08/21/23  IBC panel   Collection Time: 08/21/23  8:07 AM  Result Value Ref Range   Iron  112 42 - 145 ug/dL   Transferrin 745.9 787.9 - 360.0 mg/dL   Saturation Ratios 68.4 20.0 - 50.0 %   TIBC 355.6  250.0 - 450.0 mcg/dL  Ferritin   Collection Time: 08/21/23  8:07 AM  Result Value Ref Range   Ferritin 16.8 10.0 - 291.0 ng/mL  CBC with Differential/Platelet   Collection Time: 08/21/23  8:07 AM  Result Value Ref Range   WBC 5.6 4.0 - 10.5 K/uL   RBC 4.30 3.87 - 5.11 Mil/uL   Hemoglobin 13.5 12.0 - 15.0 g/dL  HCT 40.7 36.0 - 46.0 %   MCV 94.4 78.0 - 100.0 fl   MCHC 33.2 30.0 - 36.0 g/dL   RDW 87.0 88.4 - 84.4 %   Platelets 252.0 150.0 - 400.0 K/uL   Neutrophils Relative % 52.5 43.0 - 77.0 %   Lymphocytes Relative 31.0 12.0 - 46.0 %   Monocytes Relative 8.8 3.0 - 12.0 %   Eosinophils Relative 6.9 (H) 0.0 - 5.0 %   Basophils Relative 0.8 0.0 - 3.0 %   Neutro Abs 3.0 1.4 - 7.7 K/uL   Lymphs Abs 1.7 0.7 - 4.0 K/uL   Monocytes Absolute 0.5 0.1 - 1.0 K/uL   Eosinophils Absolute 0.4 0.0 - 0.7 K/uL   Basophils Absolute 0.0 0.0 - 0.1 K/uL  Basic metabolic panel   Collection Time: 08/21/23  8:07 AM  Result Value Ref Range   Sodium 138 135 - 145 mEq/L   Potassium 4.2 3.5 - 5.1 mEq/L   Chloride 103 96 - 112 mEq/L   CO2 27 19 - 32 mEq/L   Glucose, Bld 88 70 - 99 mg/dL   BUN 10 6 - 23 mg/dL   Creatinine, Ser 9.15 0.40 - 1.20 mg/dL   GFR 15.96 >39.99 mL/min   Calcium 8.8 8.4 - 10.5 mg/dL  Lipid panel   Collection Time: 08/21/23  8:07 AM  Result Value Ref Range   Cholesterol 196 0 - 200 mg/dL   Triglycerides 35.9 0.0 - 149.0 mg/dL   HDL 27.59 >60.99 mg/dL   VLDL 87.1 0.0 - 59.9 mg/dL   LDL Cholesterol 888 (H) 0 - 99 mg/dL   Total CHOL/HDL Ratio 3    NonHDL 123.92     Assessment & Plan:   Problem List Items Addressed This Visit     Health maintenance examination - Primary (Chronic)   Preventative protocols reviewed and updated unless pt declined. Discussed healthy diet and lifestyle.       Protein C deficiency (HCC)   No personal hx blood clots.       Hypertension, essential   Chronic, stable on current regimen.       Relevant Medications   amLODipine  (NORVASC )  2.5 MG tablet   Stressful life events affecting family and household   Continues caring for father - helps him with transportation to medical appts - about to restart chemo      Iron  deficiency   Stable readings on MWF oral iron , tolerating well  - continue.       Other Visit Diagnoses       Special screening for malignant neoplasms, colon       Relevant Orders   Fecal occult blood, imunochemical        Meds ordered this encounter  Medications   amLODipine  (NORVASC ) 2.5 MG tablet    Sig: Take 1 tablet (2.5 mg total) by mouth daily.    Dispense:  90 tablet    Refill:  4    Orders Placed This Encounter  Procedures   Fecal occult blood, imunochemical    Standing Status:   Future    Expiration Date:   08/27/2024    Patient Instructions  Pass by lab to pick up stool kit.  Call to schedule mammogram at your convenience: [facility] at [facility] [phone] Schedule dentist appointment at your convenience  Good to see you today Return as needed or in 1 year for next physical   Follow up plan: Return in about 1 year (around 08/27/2024) for annual exam,  prior fasting for blood work.  Anton Blas, MD

## 2023-08-28 NOTE — Assessment & Plan Note (Signed)
Chronic, stable on current regimen.  

## 2023-08-29 ENCOUNTER — Ambulatory Visit: Payer: 59 | Admitting: Obstetrics & Gynecology

## 2023-08-30 LAB — FECAL OCCULT BLOOD, IMMUNOCHEMICAL: Fecal Occult Bld: NEGATIVE

## 2023-09-21 ENCOUNTER — Ambulatory Visit: Payer: 59 | Admitting: Obstetrics & Gynecology

## 2023-09-21 ENCOUNTER — Other Ambulatory Visit (HOSPITAL_COMMUNITY)
Admission: RE | Admit: 2023-09-21 | Discharge: 2023-09-21 | Disposition: A | Discharge status: Home / Self Care | Payer: 59 | Source: Ambulatory Visit | Attending: Obstetrics & Gynecology | Admitting: Obstetrics & Gynecology

## 2023-09-21 ENCOUNTER — Encounter: Payer: Self-pay | Admitting: Obstetrics & Gynecology

## 2023-09-21 VITALS — BP 139/94 | HR 76 | Ht 65.0 in | Wt 183.0 lb

## 2023-09-21 DIAGNOSIS — Z01419 Encounter for gynecological examination (general) (routine) without abnormal findings: Secondary | ICD-10-CM | POA: Diagnosis present

## 2023-09-21 DIAGNOSIS — Z1151 Encounter for screening for human papillomavirus (HPV): Secondary | ICD-10-CM | POA: Diagnosis not present

## 2023-09-21 DIAGNOSIS — Z1231 Encounter for screening mammogram for malignant neoplasm of breast: Secondary | ICD-10-CM

## 2023-09-21 NOTE — Progress Notes (Signed)
GYNECOLOGY ANNUAL PREVENTATIVE CARE ENCOUNTER NOTE  History:     [name] is a 48 y.o. [phone] female here for a routine annual gynecologic exam.  Current complaints: none.   Denies abnormal vaginal bleeding, discharge, pelvic pain, problems with intercourse or other gynecologic concerns.    Gynecologic History Patient's last menstrual period was 09/05/2023. Contraception: none Last Pap: 05/27/2019. Result was normal with negative HPV Last Mammogram: 03/10/2021.  Result was normal Last Fecal occult blood test: 08/28/2023.  Result was negative  Obstetric History OB History  Gravida Para Term Preterm AB Living  4 2 1 1 2 1   SAB IAB Ectopic Multiple Live Births  1 1 0 0 2    # Outcome Date GA Lbr Len/2nd Weight Sex Type Anes PTL Lv  4 SAB 08/18/16 [redacted]w[redacted]d    SAB     3 IAB 03/06/15 [redacted]w[redacted]d    TAB     2 Term 11/05/04 [redacted]w[redacted]d  6 lb 12 oz (3.062 kg) F CS-Unspec   LIV  1 Preterm 06/08/00 [redacted]w[redacted]d  14.9 oz (0.423 kg) F CS-Classical Spinal  DEC    Past Medical History:  Diagnosis Date   Diverticulosis of colon    History of abnormal cervical Pap smear    age 30   History of asthma    childhood   History of diverticulitis of colon 11/2012   Hypertension    Miscarriage 01/2018   Missed ab    Protein C deficiency [facility])     Past Surgical History:  Procedure Laterality Date   CESAREAN SECTION  06/08/2000   CESAREAN SECTION  11/05/2004   COLONOSCOPY  12/2012   mod diverticulosis Arlyce Dice)   COLPOSCOPY  2013   GYNECOLOGIC CRYOSURGERY  1999   KNEE ARTHROSCOPY Right 12/2022   microfracture procedure ([facility] in [place])    Current Outpatient Medications on File Prior to Visit  Medication Sig Dispense Refill   albuterol (VENTOLIN HFA) 108 (90 Base) MCG/ACT inhaler Inhale 2 puffs into the lungs every 6 (six) hours as needed for wheezing or shortness of breath. 1 each 1   amLODipine (NORVASC) 2.5 MG tablet Take 1 tablet (2.5 mg total) by mouth daily. 90 tablet 4    cetirizine (ZYRTEC) 10 MG tablet Take 10 mg by mouth daily. As needed     hydrOXYzine (ATARAX) 25 MG tablet TAKE 1 TABLET (25 MG TOTAL) BY MOUTH AT BEDTIME AS NEEDED FOR SLEEP 90 tablet 1   Iron, Ferrous Sulfate, 325 (65 Fe) MG TABS Take 325 mg by mouth every Monday, Wednesday, and Friday.     Multiple Vitamin (MULTIVITAMIN ADULT) TABS Take 1 tablet by mouth daily.     No current facility-administered medications on file prior to visit.    Allergies  Allergen Reactions   Other Hives    Eggplant, pecans, corn, walnuts, tobacco    Social History:  reports that she has never smoked. She has never used smokeless tobacco. She reports current alcohol use. She reports that she does not use drugs.  Family History  Problem Relation Age of Onset   Hypertension Mother    Hypertension Father    Diabetes Father    Cancer Father 1       pancreatic   Stroke Paternal Grandmother    Prostate cancer Paternal Grandfather    Breast cancer Maternal Aunt 56   Colon cancer Maternal Aunt    Pulmonary embolism Cousin 26       pulm embolism  The following portions of the patient's history were reviewed and updated as appropriate: allergies, current medications, past family history, past medical history, past social history, past surgical history and problem list.  Review of Systems Pertinent items noted in HPI and remainder of comprehensive ROS otherwise negative.  Physical Exam:  BP (!) 139/94   Pulse 76   Ht 5\' 5"  (1.651 m)   Wt 183 lb (83 kg)   LMP 09/05/2023   BMI 30.45 kg/m  CONSTITUTIONAL: Well-developed, well-nourished female in no acute distress.  HENT:  Normocephalic, atraumatic, External right and left ear normal.  EYES: Conjunctivae and EOM are normal. Pupils are equal, round, and reactive to light. No scleral icterus.  NECK: Normal range of motion, supple, no masses.  Normal thyroid.  SKIN: Skin is warm and dry. No rash noted. Not diaphoretic. No erythema. No  pallor. MUSCULOSKELETAL: Normal range of motion. No tenderness.  No cyanosis, clubbing, or edema. NEUROLOGIC: Alert and oriented to person, place, and time. Normal reflexes, muscle tone coordination.  PSYCHIATRIC: Normal mood and affect. Normal behavior. Normal judgment and thought content. CARDIOVASCULAR: Normal heart rate noted, regular rhythm RESPIRATORY: Clear to auscultation bilaterally. Effort and breath sounds normal, no problems with respiration noted. BREASTS: Symmetric in size. No masses, tenderness, skin changes, nipple drainage, or lymphadenopathy bilaterally. Performed in the presence of a chaperone. ABDOMEN: Soft, no distention noted.  No tenderness, rebound or guarding.  PELVIC: Normal appearing external genitalia and urethral meatus; normal appearing vaginal mucosa and cervix.  No abnormal vaginal discharge noted.  Pap smear obtained.  Normal uterine size, no other palpable masses, no uterine or adnexal tenderness.  Performed in the presence of a chaperone.   Assessment and Plan:     1. Breast cancer screening by mammogram Normal breast examination today, she was advised to perform periodic self breast examinations.  Mammogram scheduled for breast cancer screening. - MM 3D SCREENING MAMMOGRAM BILATERAL BREAST; Future  2. Well woman exam with routine gynecological exam (Primary) - Cytology - PAP Will follow up results of pap smear and manage accordingly. Colon cancer screening is up to date, discussed Cologuard vs Colonoscopy Routine preventative health maintenance measures emphasized. Please refer to After Visit Summary for other counseling recommendations.      Jaynie Collins, MD, FACOG Obstetrician & Gynecologist, [facility] for [employer], [facility]

## 2023-09-27 ENCOUNTER — Encounter: Payer: Self-pay | Admitting: Obstetrics & Gynecology

## 2023-09-27 LAB — CYTOLOGY - PAP
Comment: NEGATIVE
Diagnosis: NEGATIVE
High risk HPV: NEGATIVE

## 2023-10-19 ENCOUNTER — Ambulatory Visit
Admission: RE | Admit: 2023-10-19 | Discharge: 2023-10-19 | Disposition: A | Discharge status: Home / Self Care | Payer: 59 | Source: Ambulatory Visit | Attending: Obstetrics & Gynecology | Admitting: Obstetrics & Gynecology

## 2023-10-19 DIAGNOSIS — Z1231 Encounter for screening mammogram for malignant neoplasm of breast: Secondary | ICD-10-CM | POA: Insufficient documentation

## 2023-10-24 ENCOUNTER — Encounter: Payer: Self-pay | Admitting: Obstetrics & Gynecology

## 2024-02-14 ENCOUNTER — Encounter: Payer: Self-pay | Admitting: Family Medicine

## 2024-02-15 ENCOUNTER — Telehealth: Payer: Self-pay | Admitting: Family Medicine

## 2024-02-15 NOTE — Telephone Encounter (Signed)
 Patient came in to drop off FMLA paperwork for [place].

## 2024-02-16 NOTE — Telephone Encounter (Addendum)
 Left message to return call to office. Need to follow up regarding FMLA/disability forms.  Received FMLA extension forms. Placed in providers box for review.

## 2024-02-16 NOTE — Telephone Encounter (Unsigned)
 Copied from CRM [id number]. Topic: General - Other >> Feb 16, 2024  3:38 PM Turkey A wrote: Reason for CRM: Patient was returning a call to [facility] but patient hung up before Rocky was able to get on the line

## 2024-02-16 NOTE — Telephone Encounter (Signed)
 Placed ppw on Erin's desk.

## 2024-02-19 DIAGNOSIS — Z0279 Encounter for issue of other medical certificate: Secondary | ICD-10-CM

## 2024-02-19 NOTE — Telephone Encounter (Addendum)
 Placed form in basket on [name]'s desk.

## 2024-02-19 NOTE — Telephone Encounter (Addendum)
 Sorry to hear her dad is not doing well - stage 4 pancreatic cancer. Pt is his caregiver. Forms filled out and placed in LIsa's box.

## 2024-02-19 NOTE — Telephone Encounter (Signed)
 Completed paperwork received. Copy sent to scan. Original and copy of the forms are at the front desk for pt to pick up.  Patient has been notified that the forms are ready to pick up at the front desk.

## 2024-02-22 NOTE — Telephone Encounter (Signed)
 Pt reached out stating her employer needs a specific number of days she will need to care for her father on the forms. Pt requests 5 days a week, keeping 8hrs per episode as is.  Forms placed in providers box for review.

## 2024-02-26 ENCOUNTER — Telehealth: Payer: Self-pay | Admitting: Family Medicine

## 2024-02-26 NOTE — Telephone Encounter (Signed)
 Patient picked up form

## 2024-02-26 NOTE — Telephone Encounter (Signed)
 Updated and placed in CMA box.

## 2024-02-26 NOTE — Telephone Encounter (Signed)
 Updated forms received. Copy sent to scan. Patient notified through Mychart that updated forms are ready for pick up at the front desk.

## 2024-02-26 NOTE — Telephone Encounter (Signed)
 Placed FMLA ppw in basket on Erin's desk.

## 2024-04-11 ENCOUNTER — Other Ambulatory Visit: Payer: Self-pay | Admitting: Family Medicine

## 2024-04-11 NOTE — Telephone Encounter (Signed)
 Unable to reach patient. Left voicemail to return call to our office.    Medication was discontinued in October 2024, patient preference. Need to find out if patient requested this refill or if it was still on Auto refill at the pharmacy.

## 2024-04-12 NOTE — Telephone Encounter (Signed)
 Copied from CRM #[id number]. Topic: General - Other >> Apr 11, 2024  4:07 PM Burnard DEL wrote: Reason for CRM: Patient returned call to CMA .Patient stated that she did not request a refill on trazadone.It may still be on auto refill.

## 2024-04-12 NOTE — Telephone Encounter (Signed)
 Will close request.  No further action needed at this time.

## 2024-05-24 ENCOUNTER — Other Ambulatory Visit: Payer: Self-pay | Admitting: Family Medicine

## 2024-05-24 DIAGNOSIS — G4700 Insomnia, unspecified: Secondary | ICD-10-CM

## 2024-05-24 NOTE — Telephone Encounter (Signed)
 E-scribed refills.  Pls schedule CPE and fasting labs for additional refills.

## 2024-05-24 NOTE — Telephone Encounter (Signed)
 I spoked with patient and she has been scheduled.

## 2024-06-17 ENCOUNTER — Encounter: Payer: Self-pay | Admitting: Family Medicine

## 2024-06-17 NOTE — Telephone Encounter (Signed)
 Do you need to see patient in office to fill out?

## 2024-06-28 NOTE — Telephone Encounter (Signed)
 Received short term disability forms/behavioral assessment forms for provider to complete  Forms placed in providers box for review

## 2024-07-04 NOTE — Telephone Encounter (Unsigned)
 Copied from CRM #[id number]. Topic: General - Other >> Jul 04, 2024 11:21 AM Maisie BROCKS wrote: Reason for CRM: pt called for someone to give her an update on her FMLA forms that was received on 11/10. She stated that they have not received it yet. Please call and advise.

## 2024-07-05 DIAGNOSIS — Z0279 Encounter for issue of other medical certificate: Secondary | ICD-10-CM

## 2024-07-08 NOTE — Telephone Encounter (Signed)
 Patient arrived in office to pick up ppwk

## 2024-07-08 NOTE — Telephone Encounter (Signed)
 Forms were more involved than anticipated - have completed and returned to [facility]

## 2024-07-08 NOTE — Telephone Encounter (Signed)
 Completed forms received and faxed to [employer] at [phone]  Copy sent to scan  MyChart message sent to patient that a copy was available to pick up at the front desk for her records.

## 2024-07-11 NOTE — Telephone Encounter (Signed)
 Copied from CRM [id number]. Topic: Clinical - Medical Advice >> Jul 11, 2024  9:03 AM Harlene ORN wrote: Reason for CRM: Darla - [employer] disability Working on a disability claim for this patient. Received paperwork that needs clarification on the dates. the last dates of her exam was either 01/06 or 11/06, just trying to clarify when her last appointment was. Phone: [phone] reference #: [id number]

## 2024-07-12 NOTE — Telephone Encounter (Signed)
 Spoke with [employer] regarding date clarification. Reference number [id number]  Re-faxed forms with reference number to [employer] at [phone] as requested.

## 2024-07-15 ENCOUNTER — Telehealth: Payer: Self-pay | Admitting: Family Medicine

## 2024-07-15 DIAGNOSIS — F4329 Adjustment disorder with other symptoms: Secondary | ICD-10-CM

## 2024-07-15 NOTE — Telephone Encounter (Signed)
 FMLA forms received. Left MyChart message regarding FMLA/disability form questions.

## 2024-07-15 NOTE — Telephone Encounter (Signed)
 FMLA for Patient forms received for completion for patient. Patient has been informed that process may take up to 5 business days.  Employer Name [employer] Reason for being out: Anxiety Any inpatient care:N/A Any Planned appointment? 1.7.26 Patient is requesting start date of 11.4.25 Patient is requesting end date of 12.8.25    Verified with patient that it is ok to leave Voicemail updates on  Mobile [phone] (mobile)  Patient would like to pick up copy in our office when form is completed.   No fax number given to fax forms to employer.  Will notify patient when forms are complete.  Forms placed in providers box for review.

## 2024-07-15 NOTE — Telephone Encounter (Signed)
 Patient dropped off FMLA forms to be completed by provider placed in Erins box.

## 2024-07-16 DIAGNOSIS — Z0279 Encounter for issue of other medical certificate: Secondary | ICD-10-CM

## 2024-07-17 NOTE — Telephone Encounter (Signed)
 Spoke with patient regarding [employer] forms.  Patient asks that provider fill out the forms for her and not as caregiver for her dad.  Forms have been placed in providers box for review.

## 2024-07-17 NOTE — Telephone Encounter (Signed)
 Patient arrived in office to pick up fmla forms

## 2024-07-17 NOTE — Telephone Encounter (Signed)
 Filled out and returned to [place].

## 2024-07-17 NOTE — Telephone Encounter (Signed)
 Completed FMLA forms received  Copy sent to scan  Pt will pick up original forms to give to employer at the front desk.

## 2024-07-22 DIAGNOSIS — Z0279 Encounter for issue of other medical certificate: Secondary | ICD-10-CM

## 2024-07-22 NOTE — Telephone Encounter (Signed)
 Forms filled out again and returned to [place].

## 2024-07-22 NOTE — Telephone Encounter (Signed)
 Patient arrived in office to pick up paperwork

## 2024-07-22 NOTE — Telephone Encounter (Signed)
 Updated forms received and faxed to [employer] at [phone]  Copy sent to scan   MyChart message sent to patient stating forms are ready to pick up at the front desk.

## 2024-07-26 NOTE — Telephone Encounter (Signed)
 Sent via [employer]

## 2024-07-26 NOTE — Telephone Encounter (Signed)
 Patient called in stating that she needs a work note before she can return back to work on 07/29/2024 . She has been  out since 06/25/2024 .

## 2024-08-18 ENCOUNTER — Other Ambulatory Visit: Payer: Self-pay | Admitting: Family Medicine

## 2024-08-18 DIAGNOSIS — E611 Iron deficiency: Secondary | ICD-10-CM

## 2024-08-18 DIAGNOSIS — Z131 Encounter for screening for diabetes mellitus: Secondary | ICD-10-CM

## 2024-08-18 DIAGNOSIS — R202 Paresthesia of skin: Secondary | ICD-10-CM

## 2024-08-18 DIAGNOSIS — Z13220 Encounter for screening for lipoid disorders: Secondary | ICD-10-CM

## 2024-08-21 ENCOUNTER — Other Ambulatory Visit

## 2024-08-28 ENCOUNTER — Encounter: Admitting: Family Medicine

## 2024-08-29 ENCOUNTER — Other Ambulatory Visit: Payer: Self-pay | Admitting: Family Medicine

## 2024-08-29 DIAGNOSIS — I10 Essential (primary) hypertension: Secondary | ICD-10-CM

## 2024-09-03 ENCOUNTER — Encounter: Payer: Self-pay | Admitting: Family Medicine

## 2024-09-04 ENCOUNTER — Other Ambulatory Visit: Payer: Self-pay | Admitting: Family Medicine

## 2024-09-04 ENCOUNTER — Ambulatory Visit: Payer: Self-pay

## 2024-09-04 DIAGNOSIS — I10 Essential (primary) hypertension: Secondary | ICD-10-CM

## 2024-09-04 MED ORDER — AMLODIPINE BESYLATE 2.5 MG PO TABS: 2.5000 mg | ORAL_TABLET | Freq: Every day | ORAL | 3 refills | Status: DC

## 2024-09-04 NOTE — Telephone Encounter (Signed)
 FYI Only or Action Required?: Action required by provider: clinical question for provider and update on patient condition.  Patient was last seen in primary care on 08/28/2023 by Rilla Baller, MD.  Called Nurse Triage reporting Hypertension.  Symptoms began a week ago.  Interventions attempted: Prescription medications: amlodipine  2.5mg .  Symptoms are: unchanged.  Triage Disposition: Call PCP Within 24 Hours  Patient/caregiver understands and will follow disposition?: Yes     Copied from CRM [id number]. Topic: Clinical - Red Word Triage >> Sep 04, 2024  1:41 PM Deleta RAMAN wrote: Red Word that prompted transfer to Nurse Triage: patient mother is calling due to blood pressure spiking 150/105   Reason for Disposition  Ran out of BP medications  Answer Assessment - Initial Assessment Questions Pt's mother, Sanye, contacted clinic to f/u on rx. Pt has been out of amlodipine  x 1 week. BP today 150/105, asymptomatic. Discussed per my chart, [facility] but [facility] closed for lunch and unable to reach staff. Several messages have been routed and Demeka requesting to pick up samples until scheduled appt. Discussed I would route request as well as info regarding [facility] at [facility] to clinic. She voiced appreciation and can be reached at, [phone]    1. BLOOD PRESSURE: What is your blood pressure? Did you take at least two measurements 5 minutes apart?     150/105  2. ONSET: When did you take your blood pressure?     Today   4. HISTORY: Do you have a history of high blood pressure?     Amlodipine  2.5mg    5. MEDICINES: Are you taking any medicines for blood pressure? Have you missed any doses recently?     No currently out of rx; [facility] but unable to get in touch with staff d/t closure for lunch   6. OTHER SYMPTOMS: Do you have any symptoms? (e.g.,  blurred vision, chest pain, difficulty breathing, headache, weakness)     No; pt is asymptomatic  Protocols used: Blood Pressure - High-A-AH

## 2024-09-04 NOTE — Telephone Encounter (Signed)
 Fyi this shouldn't have been denied and not sent to me.  Last fill was 08/28/2023

## 2024-09-04 NOTE — Telephone Encounter (Signed)
 Pt notified of rx. States she will see us  at appt next week and called back with any questions/ concerns

## 2024-09-04 NOTE — Telephone Encounter (Signed)
 Please notify this has been sent in.  Keep appt next week.

## 2024-09-04 NOTE — Progress Notes (Signed)
 ERx amlodipine  (again)

## 2024-09-08 ENCOUNTER — Other Ambulatory Visit: Payer: Self-pay | Admitting: Family Medicine

## 2024-09-08 DIAGNOSIS — G4700 Insomnia, unspecified: Secondary | ICD-10-CM

## 2024-09-11 ENCOUNTER — Ambulatory Visit: Admitting: Family Medicine

## 2024-09-11 ENCOUNTER — Encounter: Payer: Self-pay | Admitting: Family Medicine

## 2024-09-11 DIAGNOSIS — I10 Essential (primary) hypertension: Secondary | ICD-10-CM | POA: Diagnosis not present

## 2024-09-11 NOTE — Assessment & Plan Note (Signed)
 Chronic, above goal  Reviewed options - switching to BB or ARB, vs increasing amlodipine  dose however hesitant for this given h/o palpitations and pedal edema as side effects. She desires to try higher amlodipine  dose for the next month until she returns for CPE.  Reasonable - will start amlodipine  5mg  daily, update with effect, monitoring for worsening side effects.  Reviewed low salt diet, aerobic exercise, increased fruits/vegetables - work on [employer].

## 2024-09-11 NOTE — Progress Notes (Signed)
 " Ph: [phone] Fax: [phone]   Patient ID: [name], female    DOB: 29-Aug-1975, 49 y.o.   MRN: [id number]  This visit was conducted in person.  BP (!) 138/90 (BP Location: Right Arm, Cuff Size: Large)   Pulse 91   Temp 98.5 F (36.9 C) (Oral)   Ht 5' 5 (1.651 m)   Wt 180 lb (81.6 kg)   LMP 09/06/2024 (Exact Date)   SpO2 97%   BMI 29.95 kg/m   BP Readings from Last 3 Encounters:  09/11/24 (!) 138/90  09/21/23 (!) 139/94  08/28/23 128/84    Pulse Readings from Last 3 Encounters:  09/11/24 91  09/21/23 76  08/28/23 83    CC: med refill, declines CPE today due to insurance issues Subjective:   HPI: DELANCEY MORAES is a 49 y.o. female presenting on 09/11/2024 for Medical Management of Chronic Issues (Med refill )   Father with cancer passed away Jun 23, 2024.   HTN - Compliant with current antihypertensive regimen of amlodipine  2.5mg  daily.  Does check blood pressures at home: 120s/80-90. No low blood pressure readings or symptoms of dizziness/syncope.  Denies HA, vision changes, CP/tightness, SOB. Notes occ leg swelling - attributed to amlodipine . + palpitations - attributes to amlodipine .   H/o protein C deficiency. No personal h/o blood clots.  H/o reactive airways during allergy season, rarely uses PRN albuterol .      Relevant past medical, surgical, family and social history reviewed and updated as indicated. Interim medical history since our last visit reviewed. Allergies and medications reviewed and updated. Outpatient Medications Prior to Visit  Medication Sig Dispense Refill   albuterol  (VENTOLIN  HFA) 108 (90 Base) MCG/ACT inhaler Inhale 2 puffs into the lungs every 6 (six) hours as needed for wheezing or shortness of breath. 1 each 1   cetirizine (ZYRTEC) 10 MG tablet Take 10 mg by mouth daily. As needed     hydrOXYzine  (ATARAX ) 25 MG tablet TAKE 1 TABLET (25 MG TOTAL) BY MOUTH AT BEDTIME AS NEEDED FOR SLEEP 30 tablet 0   Iron , Ferrous Sulfate , 325 (65  Fe) MG TABS Take 325 mg by mouth every Monday, Wednesday, and Friday.     Multiple Vitamin (MULTIVITAMIN ADULT) TABS Take 1 tablet by mouth daily.     amLODipine  (NORVASC ) 2.5 MG tablet Take 1 tablet (2.5 mg total) by mouth daily. 90 tablet 3   amLODipine  (NORVASC ) 2.5 MG tablet Take 2 tablets (5 mg total) by mouth daily.     No facility-administered medications prior to visit.     Per HPI unless specifically indicated in ROS section below Review of Systems  Objective:  BP (!) 138/90 (BP Location: Right Arm, Cuff Size: Large)   Pulse 91   Temp 98.5 F (36.9 C) (Oral)   Ht 5' 5 (1.651 m)   Wt 180 lb (81.6 kg)   LMP 09/06/2024 (Exact Date)   SpO2 97%   BMI 29.95 kg/m   Wt Readings from Last 3 Encounters:  09/11/24 180 lb (81.6 kg)  09/21/23 183 lb (83 kg)  08/28/23 181 lb 2 oz (82.2 kg)      Physical Exam Vitals and nursing note reviewed.  Constitutional:      Appearance: Normal appearance. She is not ill-appearing.  HENT:     Head: Normocephalic and atraumatic.     Mouth/Throat:     Mouth: Mucous membranes are moist.     Pharynx: Oropharynx is clear. No oropharyngeal exudate or posterior oropharyngeal erythema.  Eyes:     Extraocular Movements: Extraocular movements intact.     Pupils: Pupils are equal, round, and reactive to light.  Cardiovascular:     Rate and Rhythm: Normal rate and regular rhythm.     Pulses: Normal pulses.     Heart sounds: Normal heart sounds. No murmur heard. Pulmonary:     Effort: Pulmonary effort is normal. No respiratory distress.     Breath sounds: Normal breath sounds. No wheezing, rhonchi or rales.  Musculoskeletal:     Right lower leg: No edema.     Left lower leg: No edema.  Skin:    General: Skin is warm and dry.     Findings: No rash.  Neurological:     Mental Status: She is alert.  Psychiatric:        Mood and Affect: Mood normal.        Behavior: Behavior normal.       Results for orders placed or performed in visit on  09/21/23  Cytology - PAP   Collection Time: 09/21/23  1:26 PM  Result Value Ref Range   High risk HPV Negative    Adequacy      Satisfactory for evaluation; transformation zone component PRESENT.   Diagnosis      - Negative for intraepithelial lesion or malignancy (NILM)   Comment Normal Reference Range HPV - Negative     Assessment & Plan:  Declines flu shot today Problem List Items Addressed This Visit     Hypertension, essential   Chronic, above goal  Reviewed options - switching to BB or ARB, vs increasing amlodipine  dose however hesitant for this given h/o palpitations and pedal edema as side effects. She desires to try higher amlodipine  dose for the next month until she returns for CPE.  Reasonable - will start amlodipine  5mg  daily, update with effect, monitoring for worsening side effects.  Reviewed low salt diet, aerobic exercise, increased fruits/vegetables - work on [employer].       Relevant Medications   amLODipine  (NORVASC ) 2.5 MG tablet     No orders of the defined types were placed in this encounter.   No orders of the defined types were placed in this encounter.   Patient Instructions  Try increasing amlodipine  to 5mg  daily for the next month. Schedule physical at your convenience with fasting labs prior   Your goal blood pressure is <140/90, ideally <135/85. Work on low salt/sodium diet - goal <2 grams (2,000mg ) per day. Eat a diet high in fruits/vegetables and whole grains.  Look into mediterranean and DASH diet. Goal activity is 148min/wk of moderate intensity exercise.  This can be split into 30 minute chunks.  If you are not at this level, you can start with smaller 10-15 min increments and slowly build up activity. Look at www.heart.org for more resources    Follow up plan: Return in about 6 weeks (around 10/23/2024), or if symptoms worsen or fail to improve, for annual exam, prior fasting for blood work.  Anton Blas, MD   "

## 2024-09-11 NOTE — Patient Instructions (Addendum)
 Try increasing amlodipine  to 5mg  daily for the next month. Schedule physical at your convenience with fasting labs prior   Your goal blood pressure is <140/90, ideally <135/85. Work on low salt/sodium diet - goal <2 grams (2,000mg ) per day. Eat a diet high in fruits/vegetables and whole grains.  Look into mediterranean and DASH diet. Goal activity is 16min/wk of moderate intensity exercise.  This can be split into 30 minute chunks.  If you are not at this level, you can start with smaller 10-15 min increments and slowly build up activity. Look at www.heart.org for more resources
